# Patient Record
Sex: Male | Born: 1945 | Race: White | Hispanic: No | Marital: Married | Smoking: Current every day smoker
Health system: Southern US, Community
[De-identification: ages and names within clinical notes are randomized; demographics above are authoritative.]

## PROBLEM LIST (undated history)

## (undated) DIAGNOSIS — G621 Alcoholic polyneuropathy: Secondary | ICD-10-CM

## (undated) DIAGNOSIS — I739 Peripheral vascular disease, unspecified: Secondary | ICD-10-CM

## (undated) DIAGNOSIS — I5022 Chronic systolic (congestive) heart failure: Secondary | ICD-10-CM

## (undated) DIAGNOSIS — G20A1 Parkinson's disease without dyskinesia, without mention of fluctuations: Secondary | ICD-10-CM

## (undated) DIAGNOSIS — I2510 Atherosclerotic heart disease of native coronary artery without angina pectoris: Secondary | ICD-10-CM

## (undated) DIAGNOSIS — I10 Essential (primary) hypertension: Secondary | ICD-10-CM

## (undated) DIAGNOSIS — I219 Acute myocardial infarction, unspecified: Secondary | ICD-10-CM

## (undated) DIAGNOSIS — G20 Parkinson's disease: Secondary | ICD-10-CM

## (undated) HISTORY — PX: CORONARY ANGIOPLASTY WITH STENT PLACEMENT: SHX49

## (undated) HISTORY — PX: INGUINAL HERNIA REPAIR: SUR1180

---

## 2006-01-12 ENCOUNTER — Ambulatory Visit: Payer: Self-pay | Admitting: General Surgery

## 2006-04-17 ENCOUNTER — Ambulatory Visit: Payer: Self-pay | Admitting: General Surgery

## 2011-12-27 DIAGNOSIS — I219 Acute myocardial infarction, unspecified: Secondary | ICD-10-CM

## 2011-12-27 HISTORY — DX: Acute myocardial infarction, unspecified: I21.9

## 2012-07-12 ENCOUNTER — Inpatient Hospital Stay: Payer: Self-pay | Admitting: Internal Medicine

## 2012-07-12 LAB — COMPREHENSIVE METABOLIC PANEL
Alkaline Phosphatase: 91 U/L (ref 50–136)
BUN: 12 mg/dL (ref 7–18)
Bilirubin,Total: 1.3 mg/dL — ABNORMAL HIGH (ref 0.2–1.0)
Calcium, Total: 9.2 mg/dL (ref 8.5–10.1)
EGFR (African American): >60
EGFR (Non-African Amer.): >60
Glucose: 133 mg/dL — ABNORMAL HIGH (ref 65–99)
Osmolality: 274 (ref 275–301)
SGOT(AST): 164 U/L — ABNORMAL HIGH (ref 15–37)
Sodium: 136 mmol/L (ref 136–145)

## 2012-07-12 LAB — APTT: Activated PTT: 31.7 secs (ref 23.6–35.9)

## 2012-07-12 LAB — CK TOTAL AND CKMB (NOT AT ARMC)
CK, Total: 1188 U/L — ABNORMAL HIGH (ref 35–232)
CK-MB: 101.2 ng/mL — ABNORMAL HIGH (ref 0.5–3.6)

## 2012-07-12 LAB — CBC
HGB: 14.9 g/dL (ref 13.0–18.0)
MCH: 32.2 pg (ref 26.0–34.0)
Platelet: 199 10*3/uL (ref 150–440)
RBC: 4.63 10*6/uL (ref 4.40–5.90)
WBC: 10.6 10*3/uL (ref 3.8–10.6)

## 2012-07-12 LAB — PROTIME-INR: INR: 0.9

## 2012-07-13 LAB — LIPID PANEL
Cholesterol: 238 mg/dL — ABNORMAL HIGH (ref 0–200)
HDL Cholesterol: 39 mg/dL — ABNORMAL LOW (ref 40–60)
Ldl Cholesterol, Calc: 167 mg/dL — ABNORMAL HIGH (ref 0–100)
VLDL Cholesterol, Calc: 32 mg/dL (ref 5–40)

## 2012-07-13 LAB — APTT
Activated PTT: 71.9 secs — ABNORMAL HIGH (ref 23.6–35.9)
Activated PTT: 77.3 secs — ABNORMAL HIGH (ref 23.6–35.9)

## 2012-07-13 LAB — BASIC METABOLIC PANEL
Calcium, Total: 8.7 mg/dL (ref 8.5–10.1)
Co2: 25 mmol/L (ref 21–32)
Creatinine: 0.79 mg/dL (ref 0.60–1.30)
EGFR (Non-African Amer.): >60
Potassium: 4 mmol/L (ref 3.5–5.1)
Sodium: 136 mmol/L (ref 136–145)

## 2012-07-13 LAB — CK-MB: CK-MB: 167.8 ng/mL — ABNORMAL HIGH (ref 0.5–3.6)

## 2012-08-27 ENCOUNTER — Inpatient Hospital Stay: Payer: Self-pay | Admitting: Student

## 2012-08-27 LAB — CBC WITH DIFFERENTIAL/PLATELET
Basophil #: 0.1 10*3/uL (ref 0.0–0.1)
Basophil %: 1 %
Eosinophil #: 0.3 10*3/uL (ref 0.0–0.7)
HCT: 39.5 % — ABNORMAL LOW (ref 40.0–52.0)
HGB: 13.8 g/dL (ref 13.0–18.0)
Lymphocyte #: 1.5 10*3/uL (ref 1.0–3.6)
MCH: 32.5 pg (ref 26.0–34.0)
MCHC: 34.9 g/dL (ref 32.0–36.0)
MCV: 93 fL (ref 80–100)
Monocyte #: 0.7 x10 3/mm (ref 0.2–1.0)
Neutrophil #: 7.4 10*3/uL — ABNORMAL HIGH (ref 1.4–6.5)
Neutrophil %: 73.8 %
RDW: 13.9 % (ref 11.5–14.5)

## 2012-08-27 LAB — PROTIME-INR: INR: 5

## 2012-08-28 LAB — CBC WITH DIFFERENTIAL/PLATELET
Basophil #: 0.1 10*3/uL (ref 0.0–0.1)
Basophil %: 1 %
Eosinophil #: 0.5 10*3/uL (ref 0.0–0.7)
Eosinophil %: 6.2 %
HCT: 33.2 % — ABNORMAL LOW (ref 40.0–52.0)
HGB: 11.4 g/dL — ABNORMAL LOW (ref 13.0–18.0)
Lymphocyte #: 2 10*3/uL (ref 1.0–3.6)
MCH: 31.9 pg (ref 26.0–34.0)
MCV: 93 fL (ref 80–100)
Monocyte #: 0.8 x10 3/mm (ref 0.2–1.0)
Monocyte %: 9.1 %
Neutrophil #: 5.4 10*3/uL (ref 1.4–6.5)
Neutrophil %: 60.7 %
Platelet: 188 10*3/uL (ref 150–440)
RBC: 3.58 10*6/uL — ABNORMAL LOW (ref 4.40–5.90)
WBC: 8.9 10*3/uL (ref 3.8–10.6)

## 2012-08-28 LAB — BASIC METABOLIC PANEL
Anion Gap: 6 — ABNORMAL LOW (ref 7–16)
BUN: 16 mg/dL (ref 7–18)
Chloride: 105 mmol/L (ref 98–107)
Creatinine: 0.84 mg/dL (ref 0.60–1.30)
EGFR (African American): >60
Glucose: 94 mg/dL (ref 65–99)
Osmolality: 278 (ref 275–301)
Potassium: 3.6 mmol/L (ref 3.5–5.1)
Sodium: 139 mmol/L (ref 136–145)

## 2012-08-28 LAB — PROTIME-INR: Prothrombin Time: 22.1 secs — ABNORMAL HIGH (ref 11.5–14.7)

## 2012-08-29 LAB — CBC WITH DIFFERENTIAL/PLATELET
Eosinophil #: 0.7 10*3/uL (ref 0.0–0.7)
Eosinophil %: 7.1 %
MCH: 32 pg (ref 26.0–34.0)
MCHC: 34.1 g/dL (ref 32.0–36.0)
Monocyte #: 0.9 x10 3/mm (ref 0.2–1.0)
Neutrophil %: 61.2 %
Platelet: 200 10*3/uL (ref 150–440)
RBC: 3.7 10*6/uL — ABNORMAL LOW (ref 4.40–5.90)

## 2012-08-29 LAB — PROTIME-INR
INR: 1.7
Prothrombin Time: 20.2 secs — ABNORMAL HIGH (ref 11.5–14.7)

## 2015-04-14 NOTE — Consult Note (Signed)
Brief Consult Note: Diagnosis: Epistaxis in patient with recent NSTEMI s/p PTCA with DES placement.   Orders entered.   Comments: Nosebleed has stopped and INR elevated at 5, Pt received FFP and repeat PT/INR ordered and resulte pending. Due to recent DES placement will need to restart Plavix as at high risk for restenosis. Will check echo to r/o LV thrombus. Patient thinks he had LV thrombus [facility] and thus reason he was on warfarin in addition to Plavix. He has allergy ro ASA noted.  Electronic Signatures: Radene KneeKhan, Shaukat Ali (MD)   (Signed 03-Sep-13 11:07)  Co-Signer: Brief Consult Note Maia PlanManzi, Aanyah Loa A (PA-C)   (Signed 03-Sep-13 08:53)  Authored: Brief Consult Note  Last Updated: 03-Sep-13 11:07 by Radene KneeKhan, Shaukat Ali (MD)

## 2015-04-14 NOTE — Consult Note (Signed)
PATIENT NAME:  Darren [name], Darren [name] MR#:  [id number] DATE OF BIRTH:  [birth year]  DATE OF CONSULTATION:  08/28/2012  REFERRING PHYSICIAN:  Dr. Imogene Burnhen CONSULTING PHYSICIAN:  Verta EllenMonica A. Shonia Skilling, PA-C  PRIMARY CARE PHYSICIAN:  Dr. Hilda BladesPolanco   REASON FOR CONSULTATION: Epistaxis in a patient with recent cardiac stenting.   HISTORY OF PRESENT ILLNESS: Darren [name] is a 69 year old male who has been seen only one time at our office to follow up after being seen [facility] for angioplasty. The patient had non-ST elevated myocardial infarction and while at [facility] had cardiac catheterization with 100% stenosis of the mid LAD, D1 of 70%, OM1 of 60%.  He was transferred and had a stent placement to the LAD and also had TEE that showed a left ventricular thrombus. The patient was discharged from [facility] on clopidogrel 75 once daily, aspirin 81 (he underwent desensitization by allergy and immunology), and was started on warfarin. On recheck in our office his INR was subtherapeutic and he was told to increase his warfarin from 5 mg to 7 mg. The patient noticed he had intermittent nosebleeds for the past 1 to 2 weeks that would stop on their own. However, he had a severe nosebleed yesterday that despite pressure and ice packs could not be controlled. On admission here, the patient's INR was found to be 5.  He had clopidogrel, aspirin, and warfarin stopped and was administered FFP. He has not had any further bleeding and his nose is currently packed (the right nare).  PAST MEDICAL HISTORY:  1. Coronary artery disease. 2. Non-ST elevated myocardial infarction status post percutaneous transluminal coronary angioplasty and stenting to the LAD, done [facility] in July 2013.  3. Left ventricular thrombus [facility]14/[facility].  4. Tobacco abuse.   ALLERGIES: Penicillin. The patient was formerly allergic to aspirin but underwent  desensit[facility].  CURRENT MEDICATIONS: 1. Aspirin 81 mg daily.  2. Captopril 12.5 mg p.o. twice daily.  3. Lipitor 80 mg p.o. at bedtime.  4. Lopressor 25 mg twice daily.  5. Plavix 75 mg daily.  6. Warfarin 7 mg daily (recently increased from 5 mg).   FAMILY HISTORY: No family history as patient is an orphan.   SOCIAL HISTORY: The patient said he has quit smoking after his non-ST elevated myocardial infarction. He does not use any alcohol or illicit drugs.   REVIEW OF SYSTEMS:  CONSTITUTIONAL: The patient denies fevers, chills, headaches, dizziness, weakness. EYES: No double vision or blurred vision. ENT: Positive for epistaxis. CARDIOVASCULAR: No chest pain, chest pressure, or orthopnea. PULMONARY: Denies shortness of breath, coughing, or wheezing. GI: Denies abdominal pain, nausea, or vomiting.   PHYSICAL EXAMINATION:  GENERAL: This is a pleasant male who is lying in bed. He is not in any acute distress.   VITAL SIGNS: Temperature 98.7 degrees Fahrenheit, heart rate is 82, respiratory rate 18, blood pressure 102/63, oxygen saturation 97% on room air.   HEAD: Head atraumatic, normocephalic.   EYES: Pupils are round and equal. Conjunctivae pink. There is no scleral icterus.   EARS: Normal to external inspection.  NOSE:  The patient has packing in the right nares.   MOUTH: Good dentition. Moist mucous membranes.   NECK: Supple. Trachea is midline. No carotid bruits appreciated.   RESPIRATORY:   Lungs are clear to auscultation. No adventitious breath sounds appreciated.   CARDIOVASCULAR: Regular rate and rhythm. No  murmurs, rubs, or gallops appreciated.   ABDOMEN: Nondistended, soft. Bowel sounds present in all four quadrants.   EXTREMITIES: No cyanosis, clubbing, or edema.   ANCILLARY DATA: Glucose 94, BUN 16, creatinine 0.84, sodium 139, potassium 3.6, chloride 105, CO2 28, estimated GFR is greater than 60. White blood cell count 8.9,  hemoglobin 11.4, hematocrit 33.2, platelet count 188,000. PT after administration of FFP was 22.1 with an INR of 1.9.  (On admission his PT was 46 and INR was 5.)   ASSESSMENT/PLAN:  1. Coronary artery disease status post recent stent placement at [facility] after non-ST elevation myocardial infarction.      The patient was discharged on clopidogrel, aspirin, and warfarin as he had a left ventricular thrombus found on echocardiogram. We will need to repeat echocardiogram and if thrombus is still visible, will need anticoagulation with warfarin. To avoid in-stent restenosis would advise restarting Plavix and aspirin as well. We will need to monitor the patient for bleeding as had severe episode of epiastaxis. We will restart Plavix and aspirin at this time and await echocardiogram results. ENT will be consulted as the patient may need possible cautery of nasal vessel which may reduce bleeding instances in the future.        We will continue to follow this patient with you. Thank you very much for this consultation and allowing [place] to participate in this patient's care.  ____________________________ Verta Ellen, PA-C mam:bjt D: 08/28/2012 10:43:55 ET T: 08/28/2012 11:17:55 ET JOB#: [id number]  cc: Verta Ellen, PA-C, <Dictator> Jeannie Done, MD Jimmey Hengel A [facility] PA ELECTRONICALLY SIGNED 08/28/2012 13:19

## 2015-04-14 NOTE — Consult Note (Signed)
Brief Consult Note: Diagnosis: Epistaxis.   Patient was seen by consultant.   Consult note dictated.   Recommend further assessment or treatment.   Comments: 69 y.o. male with anticaogulation of ASA, Plavix, Coumadin with severe nose bleed.  Packed in ED and bleeding has stopped.  Admitted for elevated INR.  PE: GEN- NAD, alert and oriented Ears- EACs clear Nose- packing in place on right, clear left sided nasal cavity OC/OP- no mass or lesions, no posterior bleeding Neck- supple with no LAD or masses or lesions  Impression:  Epistaxis s/p packing  Plan:1) Nasal saline irrigation QID 2)  Bacitracin to left nostril BID 3)  Patient needs Antibiotics with Staph coverage while packing is in place.  Given PCN allergy and difficulty with INR/Plavix, will defer to Cardiology regarding which they prefer (Bactrim/Clindamycin/Doxycycline/Omnicef) 4)  Follow up on Friday 9 /05/2012 for packing removal 5)  Afrin prn bleeding until then.  Electronic Signatures: Dalyn Becker, Rayfield Citizenreighton Charles (MD)  (Signed 03-Sep-13 12:11)  Authored: Brief Consult Note   Last Updated: 03-Sep-13 12:11 by Flossie DibbleVaught, Foster Sonnier Charles (MD)

## 2015-04-14 NOTE — Discharge Summary (Signed)
PATIENT NAME:  Darren [name], Darren [name] MR#:  [id number] DATE OF BIRTH:  [birth year]  DATE OF ADMISSION:  07/12/2012 DATE OF DISCHARGE:  07/13/2012  DIAGNOSES:  1. NSTEMI. 2. History of smoking. 3. Abnormal liver function tests.  4. Hyperlipidemia.   DISPOSITION: The patient is being [facility].   CONSULTATION: Cardiology consultation with Dr. Adrian BlackwaterShaukat Khan   LABORATORY, DIAGNOSTIC, AND RADIOLOGICAL DATA: Chest x-ray showed no acute abnormalities.   CBC normal. The patient's troponin was more than 40. BMP otherwise was normal. LDL 167, cholesterol 238, triglycerides 160, HDL 39, AST 164, ALT 94.   PROCEDURE: Cardiac catheterization.   HOSPITAL COURSE: The patient is a 69 year old male with possible history of hypertension, hyperlipidemia not on any medication, and smoking who presented with chest pain. He was found to have acute MI with a troponin more than 40. He was admitted to the ICU and started on Integrilin and heparin drips, aspirin, beta-blocker, ACE, statin, and nitro paste. Cardiology consultation with Dr. Adrian BlackwaterShaukat Khan was obtained and the patient underwent a catheterization. He was found to have 100% occluded LAD and had completed his anterior septal MI. He was initially offered to be sent to [facility], however, he did not want to go. On reviewing his film, it was decided that he would be best served at [facility]. He was [facility] to the service of Dr. Cherly Hensenhang in a stable condition.       TIME SPENT: 45 minutes.   ____________________________ Darrick MeigsSangeeta Emersyn Wyss, MD sp:drc D: 07/13/2012 16:01:57 ET T: 07/16/2012 09:13:26 ET JOB#: [id number]  cc: Darrick MeigsSangeeta Keenen Roessner, MD, <Dictator> Darrick MeigsSANGEETA Deion Forgue MD ELECTRONICALLY SIGNED 07/21/2012 7:26

## 2015-04-14 NOTE — Consult Note (Signed)
Patient has occluded 100 % LAD, and completed his anterseptal MI. [facility] and [facility]. He probably completed his MI before 5:30 PM yesterday, since had no further chest pains since then. He was offered to to be sent [facility] yesterday, but did not want to go. He will be [facility] by Dr. Cherly Hensenhang.  Electronic Signatures: Radene KneeKhan, Shaukat Ali (MD)  (Signed on 19-Jul-13 10:59)  Authored  Last Updated: 19-Jul-13 10:59 by Radene KneeKhan, Shaukat Ali (MD)

## 2015-04-14 NOTE — Discharge Summary (Signed)
PATIENT NAME:  Darren [name], Darren [name] MR#:  [id number] DATE OF BIRTH:  03/27/1946  DATE OF ADMISSION:  08/27/2012 DATE OF DISCHARGE:  08/29/2012  CONSULTANTS:  1. Dr. Andee Poles, ENT. 2. Dr. Adrian Blackwater, cardiology.   CARDIOLOGIST: Dr. Welton Flakes.  CHIEF COMPLAINT: Nosebleed.   DISCHARGE DIAGNOSES:  1. Nosebleed in the setting of supertherapeutic international normalized ratio in the setting of Coumadin use.  2. Acute hemorrhagic anemia from above status post two units of fresh frozen plasma for reversal. 3. Recent left ventricular thrombus seen in echocardiogram in July 2013 which has resolved per our echocardiogram. 4. Recent ST elevation myocardial infarction status post bare metal stent to left anterior descending in [facility] in July 2013.  5. Hypertension.  6. Hyperlipidemia.   DISCHARGE MEDICATIONS:  1. Aspirin 81 mg daily. The patient has been apparently desensitized at [facility] in July 2013.  2. Lipitor 80 mg once a day.  3. Captopril 12.5 mg 1 tab 2 times a day.  4. Plavix 75 mg daily.  5. Metoprolol tartrate 12.5 mg 1 tab times a day.  6. Vibramycin 100 mg one cap every 12 hours for three days.  7. Please stop taking warfarin.   DIET: Low sodium.   ACTIVITY: As tolerated.   FOLLOWUP:  1. Please follow up with ENT on 08/31/2012 as scheduled for you.  2. Please follow with your cardiologist within a week.  3. Please follow with your primary care physician within two weeks.   DISPOSITION: Home.   HISTORY OF PRESENT ILLNESS: For full details of the history and physical, please see the dictation on 08/27/2012 Dr. Imogene Burn, but briefly, this is a pleasant 69 year old Caucasian male with history of ST elevation myocardial infarction at [facility], hyperlipidemia, and smoking who came in with nosebleeds. The patient apparently had a LV thrombus and that is why he was on Coumadin started in July. He had been having off and on nosebleeds for the past two weeks prior to arrival and on the day of admission  had extensive nosebleed which he could not control and therefore was admitted to the hospital and found to have an elevated INR of 5. He did receive two units of FFP while in the ED.   LABORATORY, RADIOLOGICAL AND DIAGNOSTIC DATA: Initial hemoglobin 13.8, lowest hemoglobin 11.4 and by discharge 11.8. Hematocrit on arrival 39.5, by discharge 34.6. Initial WBC 10. Creatinine 0.84. Initial INR 5, last INR 1.7. Echocardiogram showing normal LV systolic function and mild diastolic dysfunction. No evidence of LV thrombus. 1.4 cm opacity mobile in left atrial wall which does not appear to be thrombus, but rather a myxoma.   HOSPITAL COURSE: The patient was admitted to the hospitalist service. The patient had been having the nosebleeds as above. However, had no significant nosebleeds and the right nostril was packed by ER physician. The patient was seen by ENT as well as his cardiologist, Dr. Welton Flakes. Per ENT's recommendation, he was started on doxycycline to help prevent possible staph infection. He has an appointment on 08/31/2012 in the morning with Dr. Andee Poles from ENT for removal of the packing and possible cauterization at that time. An echocardiogram was repeated here which did not show the thrombus and per cardiology, Coumadin can be discontinued. While hospitalized, his Plavix was resumed. He apparently did have a ST elevation myocardial infarction per review of [facility] records and had a bare metal stent to his LAD. Furthermore, he had aspirin desensitization there and was discharged on aspirin at [facility] and that can be resumed  upon discharge as well.   CODE STATUS: The patient is FULL CODE.    TOTAL TIME SPENT: 35 minutes.    ____________________________ Lilyrose Tanney, MD sa:ap D: 08/29/2012 15:12Krystal Eaton:00 ET T: 08/31/2012 11:31:29 ET JOB#: [id number]  cc: Krystal EatonShayiq Leeann Bady, MD, <Dictator> Laurier NancyShaukat A. Khan, MD Krystal EatonSHAYIQ Lehi Phifer MD ELECTRONICALLY SIGNED 09/05/2012 0:23

## 2015-04-14 NOTE — Consult Note (Signed)
PATIENT NAME:  Darren [name], Darren [name] MR#:  [id number] DATE OF BIRTH:  June 29, 1946  DATE OF CONSULTATION:  07/12/2012  CONSULTING PHYSICIAN:  Laurier NancyShaukat A. Khan, MD  INDICATION FOR CONSULTATION: Acute myocardial infarction.   HISTORY OF PRESENT ILLNESS: This is a 69 year old white male with a past medical history of hypercholesterolemia, questionable hypertension, no history of diabetes, started having chest pain at 11:00 this morning and decided to come to the Emergency Room late afternoon. He had labs done and was given Celebrex initially for chest pain, but I was called around 5:30 p.m. to evaluate the patient. At this time, the Cardiac Catheterization Lab is closed. The reason I was called was because his EKG was abnormal, and his CPK was 1188. Troponin was 13, and CPK-MB was 101.2. He was initially lifting heavy equipment at work, and initially it was thought that he may have musculoskeletal chest pain; but troponin came back positive and then EKG was done, and I was asked to evaluate the patient. Upon arrival to see him, he is denying any chest pain. He did get morphine to relieve chest pain earlier. He says when he got morphine his chest pain slowly eased off. Initially it was 4/10, right now it is 0/10. On further questioning, he says that his chest pain actually was started around 11 a.m., and that is when around 3:00 or 4:00 he got concerned and decided to come to the hospital. He also had another episode of left armpit pain yesterday and left arm pain which happened yesterday afternoon and lasted for a few hours and subsided. He considered this all musculoskeletal, and  that is why he did not seek any help.   PAST MEDICAL HISTORY:  1. History of hypercholesterolemia.  2. History of questionable hypertension.   SOCIAL HISTORY: He smokes 1 pack per day. He denies EtOH abuse, occasional one glass of wine during dinner.   FAMILY HISTORY: He was adopted. He is not able to provide family history.    PHYSICAL EXAMINATION:  GENERAL: He is alert, oriented x3, in no acute distress right now. He denies chest pain.   VITAL SIGNS: Blood pressure is 150/70, respirations 18. His pulse is 70. He is afebrile.   NECK: Exam revealed no JVD.  LUNGS: Clear.   HEART: Regular rate and rhythm. Normal S1, S2. No audible murmur.   ABDOMEN: Soft, nontender, positive bowel sounds.   EXTREMITIES: No pedal edema.   LABORATORY, DIAGNOSTIC AND RADIOLOGICAL DATA: His BUN and creatinine is 12/0.76. His SGOT is 164, SGPT 94, bilirubin 1.3. CPK 1188, CPK-MB 101.2, troponin 13. EKG shows normal sinus rhythm, poor R wave progression with Q waves in the anterolateral leads with residual 0.5 mm ST elevation in V5 to V6.   ASSESSMENT AND PLAN: Acute Q wave myocardial infarction which is evolving. He probably completed his anteroseptal myocardial infarction. Right now he is chest pain free. It is possible the MI started yesterday and had been going on since yesterday since troponin is already elevated at 13. Since he is chest pain free, option was given to him to be transferred [facility] for angioplasty immediately since the Cath Lab here is closed. The patient opted for staying here and having cardiac catheterization in the morning. He will be started on IV heparin, IV Integrilin. He is allergic to aspirin and will be started on Plavix. The procedure will be explained to him for cardiac catheterization and possible angioplasty.   Thank you very much for the referral  ____________________________ Laurier Nancy, MD sak:cbb D: 07/12/2012 17:59:59 ET T: 07/12/2012 18:29:25 ET JOB#: [id number]  cc: Laurier Nancy, MD, <Dictator> Laurier Nancy MD ELECTRONICALLY SIGNED 08/09/2012 12:43

## 2015-04-14 NOTE — H&P (Signed)
PATIENT NAME:  Darren [name], Darren [name] MR#:  [id number] DATE OF BIRTH:  11-May-1946  DATE OF ADMISSION:  08/27/2012  PRIMARY CARE PHYSICIAN: Dr. Armandina Gemma in Urgent Care   REFERRING PHYSICIAN: Dr. Brien Mates   CHIEF COMPLAINT: Nose bleeding today.   HISTORY OF PRESENT ILLNESS: 69 year old Caucasian male with a history of non-STEMI six weeks ago status post a stent in [facility], hyperlipidemia, history of smoking presented to the ED with nose bleeding today. Actually patient has nose bleeding on and off several times for the past two weeks but nose bleeding stopped spontaneously. Patient had a lot of nose bleeding this morning which has not stopped, lasted about five hours. He called Dr. Welton Flakes who is cardiologist and Dr. Welton Flakes advised patient to come to ED for further evaluation. In ED Dr. Brien Mates did compact to stop the nose bleeding. Patient right now was noted to be at 5. According to patient his last INR was 2.5 two weeks ago and then his Coumadin was increased from 5 mg daily to 7 mg daily. Patient also has some arm bruises. Dr. Brien Mates order fresh frozen plasma two units.    PAST MEDICAL HISTORY:  1. Coronary artery disease.  2. Non-STEMI. 3. Hyperlipidemia. 4. Tobacco use.   FAMILY HISTORY: Patient is an orphan. No family history.   SOCIAL HISTORY: Patient quit smoking after non-STEMI. No alcohol drinking or illicit drugs.   ALLERGIES: Aspirin and penicillin.   MEDICATIONS:  1. Aspirin 81 mg p.o. daily.  2. Captopril 12.5 mg p.o. b.i.d.  3. Lipitor 80 mg p.o. at bedtime. 4. Lopressor 25 mg tablet 0.5 tablet p.o. daily.  5. Plavix 75 mg p.o. daily.  6. Warfarin 2 mg p.o. along with 5 mg tablet p.o. daily.   REVIEW OF SYSTEMS: CONSTITUTIONAL: Patient denies any fever, chills. No headache or dizziness. No weakness. EYES: No double vision, blurred vision. ENT: Positive for epistaxis but no postnasal drip, slurred speech, or dysphagia. CARDIOVASCULAR: No chest pain, palpitation, orthopnea, or  nocturnal dyspnea. No leg edema. PULMONARY: No cough, sputum, shortness of breath, or hemoptysis. GASTROINTESTINAL: No abdominal pain, nausea, vomiting, or diarrhea. No melena or bloody stools. GENITOURINARY: No dysuria, hematuria, or incontinence. SKIN: No rash or jaundice but has some bruises in the arm. HEMATOLOGY: Has nose bleeding and bruises. ENDOCRINE: No polyuria, polydipsia, heat or cold intolerance. NEUROLOGY: No syncope, loss of consciousness or seizure   PHYSICAL EXAMINATION:  VITAL SIGNS: Temperature 98.7, blood pressure 182/99, pulse 106, oxygen saturation 97 on room air.   GENERAL: Patient is alert, awake, oriented in no acute distress.   HEENT: Pupils round, equal, reactive to light and accommodation. No discharge from nose, ear. Right nostril was compacted. No active bleeding at this time. Moist oral mucosa. Clear oropharynx.   NECK: Supple. No JVD or carotid bruits. No lymphadenopathy. No thyromegaly.   CARDIOVASCULAR: S1, S2 regular rate and rhythm. No murmurs, gallops.   PULMONARY: Bilateral air entry. No wheezing, rales. No accessory muscles to breathe.   ABDOMEN: Soft. No distention or tenderness. No organomegaly. Bowel sounds present.   EXTREMITIES: No edema, clubbing, or cyanosis. No calf tenderness. Strong bilateral pedal pulses. There are a few tiny red spots which is new on bilateral lower part of leg. According to patient this is new possibly due to Coumadin coagulopathy.   SKIN: No rash or jaundice but has some bruises on the left and right arm.   NEUROLOGY: Alert and oriented x3. No focal deficit. Power 5/5. Sensation intact. Deep tendon reflexes 2+.  LABORATORY, DIAGNOSTIC AND RADIOLOGICAL DATA: WBC 10, hemoglobin 13.8, platelets 228, PT 46, INR 5.0.   IMPRESSION:  1. Nose bleeding.  2. Coumadin coagulopathy.  3. History of non-STEMI status post stent.  4. Coronary artery disease.  5. Hypertension.  6. Hyperlipidemia.   PLAN OF TREATMENT:  1. Patient  will be admitted to telemetry floor. Will hold Coumadin, aspirin, Plavix. Will give FFP 2 units transfusion and follow up CBC. Will get a cardiology consult from Dr. Welton FlakesKhan.  2. Will continue captopril and Lopressor. Also continue Lipitor.  3. GI prophylaxis with Protonix and deep vein thrombosis prophylaxis with TED.  Discussed patient's situation and plan of treatment with patient and patient's wife.   TIME SPENT: About 60 minutes.   ____________________________ Shaune PollackQing Litisha Guagliardo, MD qc:cms D: 08/27/2012 18:35:59 ET T: 08/28/2012 06:18:59 ET JOB#: [id number]  cc: Shaune PollackQing Shauntee Karp, MD, <Dictator> Dr. Armandina GemmaPalancol in Urgent Care  Shaune PollackQING Ayyub Krall MD ELECTRONICALLY SIGNED 08/28/2012 16:18

## 2015-04-14 NOTE — Consult Note (Signed)
PATIENT NAME:  Darren [name], Darren [name] MR#:  [id number] DATE OF BIRTH:  [birth year]  DATE OF CONSULTATION:  08/28/2012  REFERRING PHYSICIAN:  Dr. Audry PiliKhan  CONSULTING PHYSICIAN:  Kyung Ruddreighton C. Khaila Velarde, MD  REASON FOR CONSULTATION: Epistaxis.   HISTORY OF PRESENT ILLNESS: Patient is a 69 year old male who six weeks ago had an MI, was catheterized and had a [facility]. He was also found to have a ventricular thrombus at that point. He was placed on aspirin, Plavix as well as Coumadin. This recently was increased from 5 mg per day to 7 mg per day. Shortly after this he developed recurrent epistaxis that has progressively worsened. He was sent to the Emergency Room and was found to have an elevated INR over 5. Balloon Miracil pack was placed on the right. His bleeding has stopped and since that time he has undergone multiple units of fresh frozen plasma and his bleeding as continue to stop and his INR has decreased. I was consulted for evaluation for possible treatment of his epistaxis.   PAST MEDICAL HISTORY:  1. Coronary artery disease with non-ST elevated MI.  2. Hyperlipidemia. 3. Tobacco abuse.   SOCIAL HISTORY: Patient has history of smoking but quit recently. Denies any excessive alcohol.    ALLERGIES: Penicillin and he has recently been desensitized to [facility] at [facility].  FAMILY HISTORY: Patient is orphaned with no known family history.   CURRENT MEDICATIONS: Reviewed and documented in electronic medical record.    REVIEW OF SYSTEMS: No fever, chills, headache, dizziness, blurred vision. Does have epistaxis as per history of present illness. No significant chest pain. No respiratory problems.   PHYSICAL EXAMINATION:  VITAL SIGNS: Temperature 98, pulse 81, respirations 18, blood pressure 144/87, pulse oximetry 97% on room air.   GENERAL: He is a well-nourished, well-developed male in no acute distress.   HEENT: Head is normocephalic, atraumatic.   EARS: External EACs are clear. No  otorrhea.   NOSE: There is an anterior balloon packing in right with balloon slowly inflated. There is no evidence of active bleeding. The packing is white. On the left there is a clear nasal cavity with no evidence of active bleeding or clot.  ORAL CAVITY AND OROPHARYNX: Reveals no abnormal mass, lesion or evidence of posterior oropharyngeal bleeding.   NECK: Supple. No lymphadenopathy or thyromegaly.   IMPRESSION: Epistaxis with anti-coagulopathy.   PLAN:  1. If the patient is required to stay on Coumadin and Plavix as well as aspirin because of ventricular thrombus I recommend a very tight control of his INR level as he is at very high risk for recurrent bleeds.  2. I recommended placement on oral antibiotic with positive staph coverage. As long as the packing is in place given the patient's recent difficulty with INR as well as his history of being penicillin allergic would defer to cardiology regarding which choice, however, usually Bactrim, doxycycline, Omnicef or clindamycin would be used in this situation.  3. Nosebleed. Precautions including humidification as well as nasal saline irrigation and bacitracin ointment applied to the nose with Q-tip several times a day to prevent from drying out.  4. I would like to see him back on Friday, September 6, in my office for removal of the packing and possible cauterization of that time.  5. Please contact my office with any questions or concerns in the interim.   ____________________________ Kyung Ruddreighton C. Eilene Voigt, MD ccv:cms D: 08/28/2012 12:17:02 ET T: 08/28/2012 12:54:00 ET  JOB#: [id number] cc: Kyung Ruddreighton C. Broc Caspers, MD, <  Dictator> Kyung Rudd MD ELECTRONICALLY SIGNED 09/17/2012 7:16

## 2015-04-14 NOTE — H&P (Signed)
PATIENT NAME:  Darren [name], Darren [name] MR#:  [id number] DATE OF BIRTH:  06/23/1946  DATE OF ADMISSION:  07/12/2012  PRIMARY CARE PHYSICIAN: Does not have one.   CHIEF COMPLAINT: Chest pain.   HISTORY OF PRESENT ILLNESS: This is a 69 year old male who presents to the hospital complaining of intermittent chest pain and diaphoresis that began in the past two days. The patient says that he was out for a job yesterday where he was doing some heavy lifting at work. Shortly after he finished his work he developed some chest pain in the left side of his shoulder blade and became diaphoretic. It lasted maybe for a few minutes and it resolved by itself.  He did not think much of it and went home. This morning at 6:00 he woke up again with left-sided shoulder pain along with some diaphoresis. Again it resolved within a half hour or so, and he was able to go back to sleep, but when he was going to work he started developing this ache in the center of his chest along with further diaphoresis. He was a bit concerned and therefore came to the ER for further evaluation. In the Emergency Room, the patient was noted to have elevated cardiac markers consistent with an acute myocardial infarction, with an elevated troponin at 13. He is EKG also showed Q waves in the lateral leads. Hospitalist services were contacted for further treatment and evaluation. The patient presently is chest pain free after receiving some morphine. He denies any shortness of breath, any nausea, vomiting, any further diaphoresis, any palpitations, any syncope, dizziness, or any other associated symptoms.   REVIEW OF SYSTEMS:  CONSTITUTIONAL: No documented fever. No weight gain or weight loss.  EYES: No blurry or double vision. ENT: No tinnitus. No postnasal drip. No redness of the oropharynx. RESPIRATORY: No cough, no wheeze, no hemoptysis. CARDIOVASCULAR: Positive chest pain. No orthopnea, no palpitations, no syncope. GI: No nausea, no vomiting, no  diarrhea, no abdominal pain, no melena or hematochezia. GU: No dysuria or hematuria. ENDOCRINE: No polyuria or nocturia. No heat or cold intolerance.  HEME: No anemia. No bruising. No bleeding. INTEGUMENT: No rashes. No lesions. MUSCULOSKELETAL: No arthritis. No swelling. No gout. NEUROLOGIC: No numbness. No tingling. No ataxia. No seizure-type activity. PSYCH: No anxiety. No insomnia. No ADD.   PAST MEDICAL HISTORY:  He does not have any significant past medical history.   ALLERGIES: Allergic to aspirin, which causes hives, and penicillin.   SOCIAL HISTORY: Does smoke about a pack per day, has been smoking for the past 40+ years. No alcohol abuse. No illicit drug abuse.   FAMILY HISTORY: He is an orphan and therefore does not recall his family history.   CURRENT MEDICATIONS: He is currently on no medications.   PHYSICAL EXAM ON ADMISSION:  VITAL SIGNS: Temperature 97.8, pulse 68, respirations 20, blood pressure 170/98, sats 100% on 2 liters nasal cannula.   GENERAL: He is a pleasant-appearing male in no apparent distress.   HEENT: Atraumatic, normocephalic. Extraocular muscles are intact. Pupils are equal and reactive to light. Sclerae anicteric. No conjunctival injection. No pharyngeal erythema.   NECK: Supple. No jugular venous distention. No bruits, no lymphadenopathy, no thyromegaly.   HEART: Regular rate and rhythm. No murmurs, no rubs, no clicks.   LUNGS: Clear to auscultation bilaterally. No rales, no rhonchi, no wheezes.   ABDOMEN: Soft, flat, nontender, nondistended. Has good bowel sounds. No hepatosplenomegaly appreciated.   EXTREMITIES: No evidence of any cyanosis, clubbing, or peripheral edema.  Has +2 pedal and radial pulses bilaterally.   NEUROLOGIC: The patient is alert, awake, and oriented times three with no focal motor or sensory deficits appreciated bilaterally.   SKIN: Moist and warm with no rash appreciated.   LYMPHATIC: There is no cervical or axillary  lymphadenopathy.   LABORATORY, DIAGNOSTIC, AND RADIOLOGICAL DATA: Serum glucose 133, BUN 12, creatinine 0.7, sodium 136, potassium 4.3, chloride 104, bicarbonate 22, albumin 3.8, AST 164, ALT 94. CK 1188, CK-MB 101.2, troponin 13. White cell count 10.6, hemoglobin 14.9, hematocrit 44.8, platelet count 199. The patient did have an EKG done which showed a normal sinus rhythm with normal axes with a right bundle branch block and Q waves in the inferolateral leads, but no ST elevations. The patient did have some T wave inversions in the lateral leads.   ASSESSMENT AND PLAN: This is a 69 year old male with no significant past medical history but history of tobacco abuse who presents to the hospital with intermittent chest pain and diaphoresis and noted to have elevated cardiac markers consistent with an acute myocardial infarction.  1. Acute myocardial infarction. This is a non-ST elevation myocardial infarction. The patient has likely had an evolving infarction over the past day as his symptoms started late yesterday. He does have risk factor given history of long-time tobacco abuse. Currently he is chest pain free and hemodynamically stable. I will give him aggressive therapy with Plavix as he cannot take aspirin, beta blocker, ACE inhibitor, and statin, put him on a heparin drip, also an Integrilin drip. The patient was seen in consultation by Dr. Adrian BlackwaterShaukat Khan. He is likely to have a cardiac catheterization tomorrow. The patient was also given the option to be transferred currently to [facility] or to stay here and have a catheterization tomorrow, and he prefers to stay here presently.  2. Abnormal liver function tests. I think this is likely secondary to his acute myocardial infarction. This can be further followed after treatment tomorrow.  3. Tobacco abuse. I will place him on a nicotine patch. The patient was counseled on quitting smoking anywhere between 1 and 3 minutes.   CODE STATUS: The patient is a FULL  CODE.  TIME SPENT ON ADMISSION: 50 minutes.   ____________________________ Rolly PancakeVivek J. Cherlynn KaiserSainani, MD vjs:bjt D: 07/12/2012 18:32:42 ET T: 07/13/2012 08:06:05 ET JOB#: [id number]  cc: Rolly PancakeVivek J. Cherlynn KaiserSainani, MD, <Dictator> Houston SirenVIVEK J Madiha Bambrick MD ELECTRONICALLY SIGNED 07/16/2012 13:29

## 2016-09-27 ENCOUNTER — Other Ambulatory Visit: Payer: Self-pay | Admitting: Internal Medicine

## 2016-09-27 DIAGNOSIS — R319 Hematuria, unspecified: Secondary | ICD-10-CM

## 2016-09-27 DIAGNOSIS — R3129 Other microscopic hematuria: Secondary | ICD-10-CM

## 2016-10-03 ENCOUNTER — Ambulatory Visit
Admission: RE | Admit: 2016-10-03 | Discharge: 2016-10-03 | Disposition: A | Discharge status: Home / Self Care | Payer: Medicare HMO | Source: Ambulatory Visit | Attending: Internal Medicine | Admitting: Internal Medicine

## 2016-10-03 DIAGNOSIS — I253 Aneurysm of heart: Secondary | ICD-10-CM | POA: Insufficient documentation

## 2016-10-03 DIAGNOSIS — N281 Cyst of kidney, acquired: Secondary | ICD-10-CM | POA: Insufficient documentation

## 2016-10-03 DIAGNOSIS — R319 Hematuria, unspecified: Secondary | ICD-10-CM | POA: Diagnosis not present

## 2016-10-03 DIAGNOSIS — I700 Atherosclerosis of aorta: Secondary | ICD-10-CM | POA: Diagnosis not present

## 2016-10-03 DIAGNOSIS — K5730 Diverticulosis of large intestine without perforation or abscess without bleeding: Secondary | ICD-10-CM | POA: Insufficient documentation

## 2016-10-03 DIAGNOSIS — I2510 Atherosclerotic heart disease of native coronary artery without angina pectoris: Secondary | ICD-10-CM | POA: Insufficient documentation

## 2016-10-03 DIAGNOSIS — I252 Old myocardial infarction: Secondary | ICD-10-CM | POA: Diagnosis not present

## 2016-10-03 HISTORY — DX: Acute myocardial infarction, unspecified: I21.9

## 2016-10-03 HISTORY — DX: Essential (primary) hypertension: I10

## 2016-10-03 LAB — POCT I-STAT CREATININE: CREATININE: 0.7 mg/dL (ref 0.61–1.24)

## 2016-10-03 MED ORDER — IOPAMIDOL (ISOVUE-300) INJECTION 61%
125.0000 mL | Freq: Once | INTRAVENOUS | Status: AC | PRN
Start: 2016-10-03 — End: 2016-10-03
  Administered 2016-10-03: 125 mL via INTRAVENOUS

## 2018-03-27 ENCOUNTER — Telehealth: Payer: Self-pay | Admitting: *Deleted

## 2018-03-27 DIAGNOSIS — Z122 Encounter for screening for malignant neoplasm of respiratory organs: Secondary | ICD-10-CM

## 2018-03-27 DIAGNOSIS — Z87891 Personal history of nicotine dependence: Secondary | ICD-10-CM

## 2018-03-27 NOTE — Telephone Encounter (Signed)
Received referral for initial lung cancer screening scan. Contacted patient and obtained smoking history,(current, ) as well as answering questions related to screening process. Patient denies signs of lung cancer such as weight loss or hemoptysis. Patient denies comorbidity that would prevent curative treatment if lung cancer were found. Patient is scheduled for shared decision making visit and CT scan on 04/05/18.

## 2018-04-05 ENCOUNTER — Inpatient Hospital Stay: Payer: Medicare HMO | Attending: Nurse Practitioner | Admitting: Nurse Practitioner

## 2018-04-05 ENCOUNTER — Ambulatory Visit
Admission: RE | Admit: 2018-04-05 | Discharge: 2018-04-05 | Disposition: A | Discharge status: Home / Self Care | Payer: Medicare HMO | Source: Ambulatory Visit | Attending: Nurse Practitioner | Admitting: Nurse Practitioner

## 2018-04-05 DIAGNOSIS — Z87891 Personal history of nicotine dependence: Secondary | ICD-10-CM

## 2018-04-05 DIAGNOSIS — Z122 Encounter for screening for malignant neoplasm of respiratory organs: Secondary | ICD-10-CM | POA: Insufficient documentation

## 2018-04-05 DIAGNOSIS — I700 Atherosclerosis of aorta: Secondary | ICD-10-CM | POA: Diagnosis not present

## 2018-04-05 NOTE — Progress Notes (Signed)
In accordance with CMS guidelines, patient has met eligibility criteria including age, absence of signs or symptoms of lung cancer.  Social History   Tobacco Use  . Smoking status: Not on file  Substance Use Topics  . Alcohol use: Not on file  . Drug use: Not on file      A shared decision-making session was conducted prior to the performance of CT scan. This includes one or more decision aids, includes benefits and harms of screening, follow-up diagnostic testing, over-diagnosis, false positive rate, and total radiation exposure.   Counseling on the importance of adherence to annual lung cancer LDCT screening, impact of co-morbidities, and ability or willingness to undergo diagnosis and treatment is imperative for compliance of the program.   Counseling on the importance of continued smoking cessation for former smokers; the importance of smoking cessation for current smokers, and information about tobacco cessation interventions have been given to patient including [place] and 1800 quit [place] programs.   Written order for lung cancer screening with LDCT has been given to the patient and any and all questions have been answered to the best of my abilities.    Yearly follow up will be coordinated by Burgess Estelle, Thoracic Navigator.  Beckey Rutter, DNP, AGNP-C [place] [facility] [phone] (work cell) [phone] (office) 04/05/18 3:37 PM

## 2018-04-06 ENCOUNTER — Encounter: Payer: Self-pay | Admitting: *Deleted

## 2018-05-10 ENCOUNTER — Other Ambulatory Visit: Payer: Self-pay | Admitting: Neurology

## 2018-05-10 DIAGNOSIS — G621 Alcoholic polyneuropathy: Secondary | ICD-10-CM

## 2018-05-29 ENCOUNTER — Ambulatory Visit
Admission: RE | Admit: 2018-05-29 | Discharge: 2018-05-29 | Disposition: A | Discharge status: Home / Self Care | Payer: Medicare HMO | Source: Ambulatory Visit | Attending: Neurology | Admitting: Neurology

## 2018-05-29 ENCOUNTER — Ambulatory Visit: Payer: Medicare HMO

## 2018-05-29 DIAGNOSIS — G621 Alcoholic polyneuropathy: Secondary | ICD-10-CM | POA: Insufficient documentation

## 2019-03-14 ENCOUNTER — Encounter: Payer: Self-pay | Admitting: *Deleted

## 2019-03-17 ENCOUNTER — Encounter: Payer: Self-pay | Admitting: *Deleted

## 2019-04-30 ENCOUNTER — Encounter: Payer: Self-pay | Admitting: Radiology

## 2019-04-30 ENCOUNTER — Other Ambulatory Visit: Payer: Self-pay

## 2019-04-30 ENCOUNTER — Emergency Department: Payer: Medicare HMO

## 2019-04-30 ENCOUNTER — Inpatient Hospital Stay
Admission: EM | Admit: 2019-04-30 | Discharge: 2019-05-27 | DRG: 326 | Disposition: E | Payer: Medicare HMO | Attending: General Surgery | Admitting: General Surgery

## 2019-04-30 DIAGNOSIS — E876 Hypokalemia: Secondary | ICD-10-CM | POA: Diagnosis not present

## 2019-04-30 DIAGNOSIS — D62 Acute posthemorrhagic anemia: Secondary | ICD-10-CM | POA: Diagnosis not present

## 2019-04-30 DIAGNOSIS — K255 Chronic or unspecified gastric ulcer with perforation: Principal | ICD-10-CM | POA: Diagnosis present

## 2019-04-30 DIAGNOSIS — R198 Other specified symptoms and signs involving the digestive system and abdomen: Secondary | ICD-10-CM | POA: Diagnosis not present

## 2019-04-30 DIAGNOSIS — E872 Acidosis: Secondary | ICD-10-CM | POA: Diagnosis not present

## 2019-04-30 DIAGNOSIS — I472 Ventricular tachycardia: Secondary | ICD-10-CM | POA: Diagnosis not present

## 2019-04-30 DIAGNOSIS — R1013 Epigastric pain: Secondary | ICD-10-CM | POA: Diagnosis present

## 2019-04-30 DIAGNOSIS — K7031 Alcoholic cirrhosis of liver with ascites: Secondary | ICD-10-CM | POA: Diagnosis not present

## 2019-04-30 DIAGNOSIS — D696 Thrombocytopenia, unspecified: Secondary | ICD-10-CM | POA: Diagnosis not present

## 2019-04-30 DIAGNOSIS — A419 Sepsis, unspecified organism: Secondary | ICD-10-CM | POA: Diagnosis not present

## 2019-04-30 DIAGNOSIS — Z978 Presence of other specified devices: Secondary | ICD-10-CM

## 2019-04-30 DIAGNOSIS — M436 Torticollis: Secondary | ICD-10-CM | POA: Diagnosis present

## 2019-04-30 DIAGNOSIS — Z4659 Encounter for fitting and adjustment of other gastrointestinal appliance and device: Secondary | ICD-10-CM

## 2019-04-30 DIAGNOSIS — J690 Pneumonitis due to inhalation of food and vomit: Secondary | ICD-10-CM | POA: Diagnosis not present

## 2019-04-30 DIAGNOSIS — K8020 Calculus of gallbladder without cholecystitis without obstruction: Secondary | ICD-10-CM | POA: Diagnosis present

## 2019-04-30 DIAGNOSIS — N179 Acute kidney failure, unspecified: Secondary | ICD-10-CM | POA: Diagnosis not present

## 2019-04-30 DIAGNOSIS — E43 Unspecified severe protein-calorie malnutrition: Secondary | ICD-10-CM | POA: Diagnosis present

## 2019-04-30 DIAGNOSIS — T8119XA Other postprocedural shock, initial encounter: Secondary | ICD-10-CM | POA: Diagnosis not present

## 2019-04-30 DIAGNOSIS — G9340 Encephalopathy, unspecified: Secondary | ICD-10-CM | POA: Diagnosis not present

## 2019-04-30 DIAGNOSIS — I420 Dilated cardiomyopathy: Secondary | ICD-10-CM | POA: Diagnosis present

## 2019-04-30 DIAGNOSIS — G9341 Metabolic encephalopathy: Secondary | ICD-10-CM | POA: Diagnosis not present

## 2019-04-30 DIAGNOSIS — I700 Atherosclerosis of aorta: Secondary | ICD-10-CM | POA: Diagnosis present

## 2019-04-30 DIAGNOSIS — R570 Cardiogenic shock: Secondary | ICD-10-CM | POA: Diagnosis not present

## 2019-04-30 DIAGNOSIS — R109 Unspecified abdominal pain: Secondary | ICD-10-CM

## 2019-04-30 DIAGNOSIS — Z452 Encounter for adjustment and management of vascular access device: Secondary | ICD-10-CM

## 2019-04-30 DIAGNOSIS — R571 Hypovolemic shock: Secondary | ICD-10-CM | POA: Diagnosis not present

## 2019-04-30 DIAGNOSIS — Z9889 Other specified postprocedural states: Secondary | ICD-10-CM | POA: Diagnosis not present

## 2019-04-30 DIAGNOSIS — R579 Shock, unspecified: Secondary | ICD-10-CM | POA: Diagnosis not present

## 2019-04-30 DIAGNOSIS — I2510 Atherosclerotic heart disease of native coronary artery without angina pectoris: Secondary | ICD-10-CM | POA: Diagnosis present

## 2019-04-30 DIAGNOSIS — Z66 Do not resuscitate: Secondary | ICD-10-CM | POA: Diagnosis not present

## 2019-04-30 DIAGNOSIS — Z20828 Contact with and (suspected) exposure to other viral communicable diseases: Secondary | ICD-10-CM | POA: Diagnosis present

## 2019-04-30 DIAGNOSIS — I110 Hypertensive heart disease with heart failure: Secondary | ICD-10-CM | POA: Diagnosis present

## 2019-04-30 DIAGNOSIS — R6521 Severe sepsis with septic shock: Secondary | ICD-10-CM | POA: Diagnosis not present

## 2019-04-30 DIAGNOSIS — E871 Hypo-osmolality and hyponatremia: Secondary | ICD-10-CM | POA: Diagnosis not present

## 2019-04-30 DIAGNOSIS — Z7189 Other specified counseling: Secondary | ICD-10-CM | POA: Diagnosis not present

## 2019-04-30 DIAGNOSIS — J95821 Acute postprocedural respiratory failure: Secondary | ICD-10-CM | POA: Diagnosis not present

## 2019-04-30 DIAGNOSIS — I252 Old myocardial infarction: Secondary | ICD-10-CM

## 2019-04-30 DIAGNOSIS — J9690 Respiratory failure, unspecified, unspecified whether with hypoxia or hypercapnia: Secondary | ICD-10-CM

## 2019-04-30 DIAGNOSIS — Z515 Encounter for palliative care: Secondary | ICD-10-CM | POA: Diagnosis not present

## 2019-04-30 DIAGNOSIS — K265 Chronic or unspecified duodenal ulcer with perforation: Secondary | ICD-10-CM | POA: Diagnosis present

## 2019-04-30 DIAGNOSIS — J9602 Acute respiratory failure with hypercapnia: Secondary | ICD-10-CM | POA: Diagnosis not present

## 2019-04-30 DIAGNOSIS — I5023 Acute on chronic systolic (congestive) heart failure: Secondary | ICD-10-CM | POA: Diagnosis present

## 2019-04-30 DIAGNOSIS — K659 Peritonitis, unspecified: Secondary | ICD-10-CM | POA: Diagnosis present

## 2019-04-30 DIAGNOSIS — L8990 Pressure ulcer of unspecified site, unspecified stage: Secondary | ICD-10-CM

## 2019-04-30 DIAGNOSIS — R6520 Severe sepsis without septic shock: Secondary | ICD-10-CM | POA: Diagnosis not present

## 2019-04-30 DIAGNOSIS — Z880 Allergy status to penicillin: Secondary | ICD-10-CM

## 2019-04-30 DIAGNOSIS — J9601 Acute respiratory failure with hypoxia: Secondary | ICD-10-CM | POA: Diagnosis not present

## 2019-04-30 DIAGNOSIS — K5730 Diverticulosis of large intestine without perforation or abscess without bleeding: Secondary | ICD-10-CM | POA: Diagnosis present

## 2019-04-30 DIAGNOSIS — T17908A Unspecified foreign body in respiratory tract, part unspecified causing other injury, initial encounter: Secondary | ICD-10-CM

## 2019-04-30 DIAGNOSIS — K668 Other specified disorders of peritoneum: Secondary | ICD-10-CM

## 2019-04-30 DIAGNOSIS — Z6821 Body mass index (BMI) 21.0-21.9, adult: Secondary | ICD-10-CM

## 2019-04-30 DIAGNOSIS — Z9911 Dependence on respirator [ventilator] status: Secondary | ICD-10-CM | POA: Diagnosis not present

## 2019-04-30 DIAGNOSIS — Z7982 Long term (current) use of aspirin: Secondary | ICD-10-CM

## 2019-04-30 DIAGNOSIS — J9600 Acute respiratory failure, unspecified whether with hypoxia or hypercapnia: Secondary | ICD-10-CM

## 2019-04-30 DIAGNOSIS — Z01818 Encounter for other preprocedural examination: Secondary | ICD-10-CM

## 2019-04-30 HISTORY — DX: Alcoholic polyneuropathy: G62.1

## 2019-04-30 HISTORY — DX: Essential (primary) hypertension: I10

## 2019-04-30 HISTORY — DX: Chronic systolic (congestive) heart failure: I50.22

## 2019-04-30 HISTORY — DX: Peripheral vascular disease, unspecified: I73.9

## 2019-04-30 HISTORY — DX: Parkinson's disease without dyskinesia, without mention of fluctuations: G20.A1

## 2019-04-30 HISTORY — DX: Atherosclerotic heart disease of native coronary artery without angina pectoris: I25.10

## 2019-04-30 HISTORY — DX: Parkinson's disease: G20

## 2019-04-30 LAB — CBC WITH DIFFERENTIAL/PLATELET
Abs Immature Granulocytes: 0.01 10*3/uL (ref 0.00–0.07)
Basophils Absolute: 0 10*3/uL (ref 0.0–0.1)
Basophils Relative: 1 %
Eosinophils Absolute: 0 10*3/uL (ref 0.0–0.5)
Eosinophils Relative: 0 %
HCT: 38.4 % — ABNORMAL LOW (ref 39.0–52.0)
Hemoglobin: 13.1 g/dL (ref 13.0–17.0)
Immature Granulocytes: 0 %
Lymphocytes Relative: 18 %
Lymphs Abs: 0.4 10*3/uL — ABNORMAL LOW (ref 0.7–4.0)
MCH: 34.2 pg — ABNORMAL HIGH (ref 26.0–34.0)
MCHC: 34.1 g/dL (ref 30.0–36.0)
MCV: 100.3 fL — ABNORMAL HIGH (ref 80.0–100.0)
Monocytes Absolute: 0.2 10*3/uL (ref 0.1–1.0)
Monocytes Relative: 11 %
Neutro Abs: 1.6 10*3/uL — ABNORMAL LOW (ref 1.7–7.7)
Neutrophils Relative %: 70 %
Platelets: 154 10*3/uL (ref 150–400)
RBC: 3.83 MIL/uL — ABNORMAL LOW (ref 4.22–5.81)
RDW: 14.2 % (ref 11.5–15.5)
WBC: 2.2 10*3/uL — ABNORMAL LOW (ref 4.0–10.5)
nRBC: 0 % (ref 0.0–0.2)

## 2019-04-30 MED ORDER — MORPHINE SULFATE (PF) 4 MG/ML IV SOLN
4.0000 mg | Freq: Once | INTRAVENOUS | Status: AC
  Administered 2019-04-30: 22:00:00 4 mg via INTRAVENOUS
  Filled 2019-04-30: qty 1

## 2019-04-30 MED ORDER — CIPROFLOXACIN IN D5W 400 MG/200ML IV SOLN
400.0000 mg | Freq: Once | INTRAVENOUS | Status: AC
  Administered 2019-04-30: 400 mg via INTRAVENOUS
  Filled 2019-04-30: qty 200

## 2019-04-30 MED ORDER — IOPAMIDOL (ISOVUE-370) INJECTION 76%
125.0000 mL | Freq: Once | INTRAVENOUS | Status: AC | PRN
  Administered 2019-04-30: 23:00:00 125 mL via INTRAVENOUS

## 2019-04-30 MED ORDER — LACTATED RINGERS IV SOLN
INTRAVENOUS | Status: DC
  Administered 2019-05-01: 12:00:00 via INTRAVENOUS
  Administered 2019-05-01: 125 mL/h via INTRAVENOUS
  Administered 2019-05-01: 09:00:00 via INTRAVENOUS

## 2019-04-30 MED ORDER — METRONIDAZOLE IN NACL 5-0.79 MG/ML-% IV SOLN
500.0000 mg | Freq: Once | INTRAVENOUS | Status: DC
  Filled 2019-04-30: qty 100

## 2019-04-30 MED ORDER — HYDROMORPHONE HCL 1 MG/ML IJ SOLN: 0.5000 mg | INTRAMUSCULAR | Status: DC | PRN

## 2019-04-30 MED ORDER — ACETAMINOPHEN 325 MG PO TABS
650.0000 mg | ORAL_TABLET | Freq: Four times a day (QID) | ORAL | Status: DC | PRN
  Administered 2019-05-09 – 2019-05-16 (×3): 650 mg via ORAL
  Filled 2019-04-30 (×3): qty 2

## 2019-04-30 MED ORDER — SODIUM CHLORIDE 0.9 % IV BOLUS
1000.0000 mL | Freq: Once | INTRAVENOUS | Status: AC
  Administered 2019-04-30: 1000 mL via INTRAVENOUS

## 2019-04-30 MED ORDER — MORPHINE SULFATE (PF) 4 MG/ML IV SOLN
4.0000 mg | Freq: Once | INTRAVENOUS | Status: AC
  Administered 2019-04-30: 4 mg via INTRAVENOUS
  Filled 2019-04-30: qty 1

## 2019-04-30 MED ORDER — ACETAMINOPHEN 650 MG RE SUPP: 650.0000 mg | Freq: Four times a day (QID) | RECTAL | Status: DC | PRN

## 2019-04-30 MED ORDER — PANTOPRAZOLE SODIUM 40 MG IV SOLR
40.0000 mg | Freq: Every day | INTRAVENOUS | Status: DC
  Administered 2019-05-01 – 2019-05-06 (×6): 40 mg via INTRAVENOUS
  Filled 2019-04-30 (×6): qty 40

## 2019-04-30 NOTE — ED Notes (Signed)
ED TO INPATIENT HANDOFF REPORT  ED Nurse Name and Phone #: Rylan Kaufmann  S Name/Age/Gender Darren [name] 73 y.o. male Room/Bed: ED25A/ED25A  Code Status   Code Status: Full Code  Home/SNF/Other Home Patient oriented to: situation Is this baseline? Yes   Triage Complete: Triage complete  Chief Complaint Abd Pain  Triage Note Patient began having abdominal pain 3 hours ago. He had a normal bowel movement about 1500hrs today. Pain is worse when walking.   Allergies Allergies  Allergen Reactions  . Penicillins Hives    Level of Care/Admitting Diagnosis ED Disposition    ED Disposition Condition Comment   Admit  Hospital [facility]: [facility] [[id number]]  Level of Care: Med-Surg [16]  Covid Evaluation: N/A  Diagnosis: Perforated viscus [161096][298420]  Admitting Physician: Shana ChuteANNON, JENNIFER [[id number]]  Attending Physician: Shana ChuteANNON, JENNIFER [[id number]]  Estimated length of stay: 5 - 7 days  Certification:: I certify this patient will need inpatient services for at least 2 midnights  Bed request comments: 2C  PT Class (Do Not Modify): Inpatient [101]  PT Acc Code (Do Not Modify): Private [1]       B Medical/Surgery History Past Medical History:  Diagnosis Date  . Hypertension   . MI (myocardial infarction) (HCC) 2013      A IV Location/Drains/Wounds Patient Lines/Drains/Airways Status   Active Line/Drains/Airways    Name:   Placement date:   Placement time:   Site:   Days:   Peripheral IV 04/28/19 Left Forearm   04/28/19    2219    Forearm   less than 1          Intake/Output Last 24 hours No intake or output data in the 24 hours ending 04/28/19 2354  Labs/Imaging Results for orders placed or performed during the hospital encounter of 04/28/19 (from the past 48 hour(s))  CBC with Differential     Status: Abnormal   Collection Time: 04/28/19  9:56 PM  Result Value Ref Range   WBC 2.2 (L) 4.0 - 10.5 K/uL    Comment: WHITE COUNT CONFIRMED ON  SMEAR   RBC 3.83 (L) 4.22 - 5.81 MIL/uL   Hemoglobin 13.1 13.0 - 17.0 g/dL   HCT 04.538.4 (L) 40.939.0 - 81.152.0 %   MCV 100.3 (H) 80.0 - 100.0 fL   MCH 34.2 (H) 26.0 - 34.0 pg   MCHC 34.1 30.0 - 36.0 g/dL    Comment: CORRECTED FOR COLD AGGLUTININS   RDW 14.2 11.5 - 15.5 %   Platelets 154 150 - 400 K/uL   nRBC 0.0 0.0 - 0.2 %   Neutrophils Relative % 70 %   Neutro Abs 1.6 (L) 1.7 - 7.7 K/uL   Lymphocytes Relative 18 %   Lymphs Abs 0.4 (L) 0.7 - 4.0 K/uL   Monocytes Relative 11 %   Monocytes Absolute 0.2 0.1 - 1.0 K/uL   Eosinophils Relative 0 %   Eosinophils Absolute 0.0 0.0 - 0.5 K/uL   Basophils Relative 1 %   Basophils Absolute 0.0 0.0 - 0.1 K/uL   Immature Granulocytes 0 %   Abs Immature Granulocytes 0.01 0.00 - 0.07 K/uL    Comment: Performed at [facility], [address] [place], [place] [id number]  Type and screen [facility]     Status: None (Preliminary result)   Collection Time: 04/28/19 11:28 PM  Result Value Ref Range   ABO/RH(D) PENDING    Antibody Screen PENDING    Sample Expiration  05/03/2019,2359 Performed at [facility], [address], [place], [place] [address]    Ct Angio Chest/abd/pel For Dissection W And/or Wo Contrast  Result Date: 05/21/2019 CLINICAL DATA:  Chest and abdominal pain. EXAM: CT ANGIOGRAPHY CHEST, ABDOMEN AND PELVIS TECHNIQUE: Multidetector CT imaging through the chest, abdomen and pelvis was performed using the standard protocol during bolus administration of intravenous contrast. Multiplanar reconstructed images and MIPs were obtained and reviewed to evaluate the vascular anatomy. CONTRAST:  ISOVUE-370 IOPAMIDOL (ISOVUE-370) INJECTION 76% COMPARISON:  Chest CT 04/05/2018 FINDINGS: CTA CHEST FINDINGS Cardiovascular: Diffuse aortic atherosclerosis and coronary artery calcifications. No aneurysm or dissection. Heart is mildly enlarged. Calcification and wall thickening at the left ventricle apex  compatible with prior infarct. Mediastinum/Nodes: No mediastinal, hilar, or axillary adenopathy. Trachea and esophagus are unremarkable. Thyroid unremarkable. Lungs/Pleura: Dependent opacities noted in both lower lungs/lung bases compatible with dependent atelectasis or scarring. No effusions. Musculoskeletal: Chest wall soft tissues are unremarkable. No acute bony abnormality. Review of the MIP images confirms the above findings. CTA ABDOMEN AND PELVIS FINDINGS VASCULAR Aorta: Atherosclerotic calcifications throughout the abdominal aorta. No aneurysm or dissection. Celiac: Patent without evidence of aneurysm, dissection, vasculitis or significant stenosis. SMA: Patent without evidence of aneurysm, dissection, vasculitis or significant stenosis. Renals: Calcified plaque at the origin of both renal arteries. Suspect mild stenosis at the origin of the left renal artery. Right renal artery appears widely patent. Small accessory left renal artery noted. IMA: Patent. Inflow: Atherosclerotic calcifications throughout the iliac vessels. No aneurysm, dissection or significant stenosis. Veins: No obvious venous abnormality within the limitations of this arterial phase study. Review of the MIP images confirms the above findings. NON-VASCULAR Hepatobiliary: Small gallstones within the gallbladder. Liver appears shrunken with probable micro nodularity along the surface suggesting cirrhosis. Gallbladder is moderately distended. No biliary ductal dilatation. Pancreas: No focal abnormality or ductal dilatation. Spleen: No focal abnormality.  Normal size. Adrenals/Urinary Tract: Left renal cysts. No suspicious renal or adrenal mass. No hydronephrosis. Urinary bladder unremarkable. Stomach/Bowel: Sigmoid diverticulosis. No active diverticulitis. There is abnormal wall thickening within the cecum and ascending colon concerning for colitis. There is indistinctness and wall thickness within the 1st and 2nd portions of the duodenum.  This is concerning for duodenitis, likely peptic ulcer disease. No evidence of bowel obstruction. Lymphatic: No adenopathy Reproductive: No visible focal abnormality. Other: Moderate to large volume ascites in the abdomen and pelvis. There is free air anteriorly within the upper abdomen and in the porta hepatis. Musculoskeletal: No acute bony abnormality. Review of the MIP images confirms the above findings. IMPRESSION: Diffuse aortic atherosclerosis. No evidence of aneurysm or dissection. Diffuse coronary artery disease. Cardiomegaly. 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 Dependent atelectasis or scarring in the lower lobes. Free intraperitoneal air. There are 2 areas of abnormal bowel, within the 1st and 2nd portions of the duodenum where there is wall thickening and indistinctness concerning for peptic ulcer disease. I suspect this is the source of the perforation and free air. However, the cecum and ascending colon also appears thick walled concerning for colitis. Appearance of the liver is suspicious for cirrhosis. Recommend clinical correlation. Moderate to large volume ascites. Cholelithiasis. Sigmoid diverticulosis. Critical Value/emergent results were called by telephone at the time of interpretation on 05/24/2019 at 11:33 pm to Dr. Phineas Semen , who verbally acknowledged these results. Electronically Signed   By: Charlett Nose M.D.   On: 05/26/2019 23:36    Pending Labs [employer] (From  admission, onward)    Start     Ordered   05/06/2019 2344  HIV antibody (Routine Testing)  Once,   STAT     05/20/2019 2350   05/06/2019 2315  Comprehensive metabolic panel  Once,   STAT     05/25/2019 2315   04/29/2019 2315  Lipase, blood  Once,   STAT     05/08/2019 2315   05/23/2019 2211  Urinalysis, Complete w Microscopic  Once,   STAT     05/22/2019 2210          Vitals/Pain Today's Vitals   04/26/2019 2156 05/14/2019 2157 05/23/2019 2200 05/09/2019 2224  BP:  127/84  112/80   Pulse: (!) 142     Resp: (!) 28  (!) 28   Temp: 99.6 F (37.6 C)     TempSrc:      SpO2: 96%     Weight:      Height:      PainSc:  10-Worst pain ever  10-Worst pain ever    Isolation Precautions No active isolations  Medications Medications  ciprofloxacin (CIPRO) IVPB 400 mg (has no administration in time range)  metroNIDAZOLE (FLAGYL) IVPB 500 mg (has no administration in time range)  sodium chloride 0.9 % bolus 1,000 mL (has no administration in time range)  morphine 4 MG/ML injection 4 mg (has no administration in time range)  lactated ringers infusion (has no administration in time range)  acetaminophen (TYLENOL) tablet 650 mg (has no administration in time range)    Or  acetaminophen (TYLENOL) suppository 650 mg (has no administration in time range)  HYDROmorphone (DILAUDID) injection 0.5 mg (has no administration in time range)  pantoprazole (PROTONIX) injection 40 mg (has no administration in time range)  morphine 4 MG/ML injection 4 mg (4 mg Intravenous Given 05/23/2019 2222)  iopamidol (ISOVUE-370) 76 % injection 125 mL (125 mLs Intravenous Contrast Given 05/04/2019 2307)    Mobility walks Moderate fall risk   Focused Assessments    R Recommendations: See Admitting Provider Note  Report given to:   Additional Notes:

## 2019-04-30 NOTE — ED Notes (Signed)
Pt is going to medical imaging.   

## 2019-04-30 NOTE — ED Provider Notes (Signed)
[facility] Emergency Department Provider Note  ____________________________________________   I have reviewed the triage vital signs and the nursing notes.   HISTORY  Chief Complaint Abdominal Pain   History limited by: Not Limited   HPI Darren [name] is a 73 y.o. male who presents to the emergency department today because of concern for abdominal pain. It started suddenly this evening. Located in the lower abdomen. The patient states that the pain is severe. Has been constant since it started. He has not noticed any recent change in his bowel movements or urine. Denies any nausea or vomiting. Denies any fevers recently. Denies history of similar pain in the past. No trauma.    Records reviewed. Per medical record review patient has a history of HTN, MI.  Past Medical History:  Diagnosis Date  . Hypertension   . MI (myocardial infarction) 2013    There are no active problems to display for this patient.   Prior to Admission medications   Not on File    Allergies Penicillins  No family history on file.  Social History Social History   Tobacco Use  . Smoking status: Not on file  Substance Use Topics  . Alcohol use: Not on file  . Drug use: Not on file    Review of Systems Constitutional: No fever/chills Eyes: No visual changes. ENT: No sore throat. Cardiovascular: Denies chest pain. Respiratory: Denies shortness of breath. Gastrointestinal: Positive for lower abdominal pain.  Genitourinary: Negative for dysuria. Musculoskeletal: Negative for back pain. Skin: Negative for rash. Neurological: Negative for headaches, focal weakness or numbness.  ____________________________________________   PHYSICAL EXAM:  VITAL SIGNS: ED Triage Vitals  Enc Vitals Group     BP 2019-05-05 2147 127/84     Pulse Rate May 05, 2019 2147 (!) 143     Resp May 05, 2019 2156 (!) 28     Temp May 05, 2019 2147 99.6 F (37.6 C)     Temp Source 2019/05/05 2147  Axillary     SpO2 05-05-2019 2147 95 %     Weight 05/05/2019 2150 176 lb (79.8 kg)     Height 05/05/19 2150 6' (1.829 m)     Head Circumference --      Peak Flow --      Pain Score 2019/05/05 2150 10   Constitutional: Alert and oriented.  Eyes: Conjunctivae are normal.  ENT      Head: Normocephalic and atraumatic.      Nose: No congestion/rhinnorhea.      Mouth/Throat: Mucous membranes are moist.      Neck: No stridor. Hematological/Lymphatic/Immunilogical: No cervical lymphadenopathy. Cardiovascular: Tachycardic, regular rhythm.  No murmurs, rubs, or gallops.  Respiratory: Tachypnea. Bilateral lungs clear.  Gastrointestinal: Soft and tender to palpation.  Genitourinary: Deferred Musculoskeletal: Normal range of motion in all extremities. No lower extremity edema. Neurologic:  Normal speech and language. No gross focal neurologic deficits are appreciated.  Skin:  Skin is warm, dry and intact. No rash noted. Psychiatric: Mood and affect are normal. Speech and behavior are normal. Patient exhibits appropriate insight and judgment.  ____________________________________________    LABS (pertinent positives/negatives)  CBC wbc 2.2, hgb 13.1, plt 154 CMP pending at time of admission  ____________________________________________   EKG  None  ____________________________________________    RADIOLOGY  CT angio dissection Free intraperitoneal air concerning for duodenal ulcer perforation.   I, Phineas Semen, personally discussed these images and results by phone with the on-call radiologist and used this discussion as part of my medical decision making.  ____________________________________________   PROCEDURES  Procedures  CRITICAL CARE Performed by: Phineas SemenGraydon Candido Flott   Total critical care time: 35 minutes  Critical care time was exclusive of separately billable procedures and treating other patients.  Critical care was necessary to treat or prevent imminent or  life-threatening deterioration.  Critical care was time spent personally by me on the following activities: development of treatment plan with patient and/or surrogate as well as nursing, discussions with consultants, evaluation of patient's response to treatment, examination of patient, obtaining history from patient or surrogate, ordering and performing treatments and interventions, ordering and review of laboratory studies, ordering and review of radiographic studies, pulse oximetry and re-evaluation of patient's condition.  ____________________________________________   INITIAL IMPRESSION / ASSESSMENT AND PLAN / ED COURSE  Pertinent labs & imaging results that were available during my care of the patient were reviewed by me and considered in my medical decision making (see chart for details).   Patient presented to the emergency department today because of concerns for abdominal pain.  He stated it did occur suddenly.  Patient was noted be quite tachycardic here in the emergency department and was significantly tender in the abdomen.  It was soft.  Given tachycardia and tenderness I did have concern for possible dissection.  CT angios was performed.  While this did not show a dissection it was concerning for free intraperitoneal peritoneal air and likely perforated duodenal ulcer.  I did consult surgery and discussed this finding with the patient.  Additionally will start antibiotics.  ____________________________________________   FINAL CLINICAL IMPRESSION(S) / ED DIAGNOSES  Final diagnoses:  Abdominal pain, unspecified abdominal location  Free intraperitoneal air     Note: This dictation was prepared with Dragon dictation. Any transcriptional errors that result from this process are unintentional     Phineas SemenGoodman, Jarelle Ates, MD 07-20-2019 1616

## 2019-04-30 NOTE — ED Notes (Signed)
Pt is back from medical imaging. 

## 2019-04-30 NOTE — ED Triage Notes (Signed)
Patient began having abdominal pain 3 hours ago. He had a normal bowel movement about 1500hrs today. Pain is worse when walking.

## 2019-05-01 ENCOUNTER — Encounter: Admission: EM | Disposition: E | Payer: Self-pay | Source: Home / Self Care | Attending: General Surgery

## 2019-05-01 ENCOUNTER — Inpatient Hospital Stay: Payer: Medicare HMO

## 2019-05-01 ENCOUNTER — Encounter: Payer: Self-pay | Admitting: General Surgery

## 2019-05-01 ENCOUNTER — Inpatient Hospital Stay: Payer: Medicare HMO | Admitting: Anesthesiology

## 2019-05-01 ENCOUNTER — Telehealth: Payer: Self-pay | Admitting: General Surgery

## 2019-05-01 ENCOUNTER — Inpatient Hospital Stay (HOSPITAL_COMMUNITY)
Admit: 2019-05-01 | Discharge: 2019-05-01 | Disposition: A | Discharge status: Home / Self Care | Payer: Medicare HMO | Attending: Pulmonary Disease | Admitting: Pulmonary Disease

## 2019-05-01 DIAGNOSIS — R579 Shock, unspecified: Secondary | ICD-10-CM

## 2019-05-01 DIAGNOSIS — T8119XA Other postprocedural shock, initial encounter: Secondary | ICD-10-CM

## 2019-05-01 DIAGNOSIS — R571 Hypovolemic shock: Secondary | ICD-10-CM

## 2019-05-01 DIAGNOSIS — R198 Other specified symptoms and signs involving the digestive system and abdomen: Secondary | ICD-10-CM

## 2019-05-01 DIAGNOSIS — R6521 Severe sepsis with septic shock: Secondary | ICD-10-CM

## 2019-05-01 DIAGNOSIS — A419 Sepsis, unspecified organism: Secondary | ICD-10-CM

## 2019-05-01 HISTORY — PX: LAPAROTOMY: SHX154

## 2019-05-01 LAB — CBC WITH DIFFERENTIAL/PLATELET
Abs Immature Granulocytes: 0.4 10*3/uL — ABNORMAL HIGH (ref 0.00–0.07)
Band Neutrophils: 43 %
Basophils Absolute: 0 10*3/uL (ref 0.0–0.1)
Basophils Relative: 0 %
Eosinophils Absolute: 0 10*3/uL (ref 0.0–0.5)
Eosinophils Relative: 0 %
HCT: 23.9 % — ABNORMAL LOW (ref 39.0–52.0)
Hemoglobin: 8.2 g/dL — ABNORMAL LOW (ref 13.0–17.0)
Lymphocytes Relative: 1 %
Lymphs Abs: 0.1 10*3/uL — ABNORMAL LOW (ref 0.7–4.0)
MCH: 33.7 pg (ref 26.0–34.0)
MCHC: 34.3 g/dL (ref 30.0–36.0)
MCV: 98.4 fL (ref 80.0–100.0)
Metamyelocytes Relative: 3 %
Monocytes Absolute: 0.7 10*3/uL (ref 0.1–1.0)
Monocytes Relative: 5 %
Neutro Abs: 11.9 10*3/uL — ABNORMAL HIGH (ref 1.7–7.7)
Neutrophils Relative %: 48 %
Platelets: 115 10*3/uL — ABNORMAL LOW (ref 150–400)
RBC: 2.43 MIL/uL — ABNORMAL LOW (ref 4.22–5.81)
RDW: 16 % — ABNORMAL HIGH (ref 11.5–15.5)
Smear Review: NORMAL
WBC: 13.1 10*3/uL — ABNORMAL HIGH (ref 4.0–10.5)
nRBC: 0 % (ref 0.0–0.2)

## 2019-05-01 LAB — COMPREHENSIVE METABOLIC PANEL
ALT: 70 U/L — ABNORMAL HIGH (ref 0–44)
AST: 136 U/L — ABNORMAL HIGH (ref 15–41)
Albumin: 2.2 g/dL — ABNORMAL LOW (ref 3.5–5.0)
Alkaline Phosphatase: 143 U/L — ABNORMAL HIGH (ref 38–126)
Anion gap: 13 (ref 5–15)
BUN: 20 mg/dL (ref 8–23)
CO2: 20 mmol/L — ABNORMAL LOW (ref 22–32)
Calcium: 7.3 mg/dL — ABNORMAL LOW (ref 8.9–10.3)
Chloride: 89 mmol/L — ABNORMAL LOW (ref 98–111)
Creatinine, Ser: 2.01 mg/dL — ABNORMAL HIGH (ref 0.61–1.24)
GFR calc Af Amer: 38 mL/min — ABNORMAL LOW (ref >60)
GFR calc non Af Amer: 33 mL/min — ABNORMAL LOW (ref >60)
Glucose, Bld: 96 mg/dL (ref 70–99)
Potassium: 3.8 mmol/L (ref 3.5–5.1)
Sodium: 122 mmol/L — ABNORMAL LOW (ref 135–145)
Total Bilirubin: 2.3 mg/dL — ABNORMAL HIGH (ref 0.3–1.2)
Total Protein: 4.7 g/dL — ABNORMAL LOW (ref 6.5–8.1)

## 2019-05-01 LAB — BASIC METABOLIC PANEL
Anion gap: 11 (ref 5–15)
BUN: 18 mg/dL (ref 8–23)
CO2: 17 mmol/L — ABNORMAL LOW (ref 22–32)
Calcium: 6.6 mg/dL — ABNORMAL LOW (ref 8.9–10.3)
Chloride: 100 mmol/L (ref 98–111)
Creatinine, Ser: 1.41 mg/dL — ABNORMAL HIGH (ref 0.61–1.24)
GFR calc Af Amer: 58 mL/min — ABNORMAL LOW (ref >60)
GFR calc non Af Amer: 50 mL/min — ABNORMAL LOW (ref >60)
Glucose, Bld: 106 mg/dL — ABNORMAL HIGH (ref 70–99)
Potassium: 3.8 mmol/L (ref 3.5–5.1)
Sodium: 128 mmol/L — ABNORMAL LOW (ref 135–145)

## 2019-05-01 LAB — BLOOD GAS, ARTERIAL
Acid-base deficit: 9.6 mmol/L — ABNORMAL HIGH (ref 0.0–2.0)
Acid-base deficit: 10.7 mmol/L — ABNORMAL HIGH (ref 0.0–2.0)
Acid-base deficit: 10.6 mmol/L — ABNORMAL HIGH (ref 0.0–2.0)
Bicarbonate: 17.6 mmol/L — ABNORMAL LOW (ref 20.0–28.0)
Bicarbonate: 15.9 mmol/L — ABNORMAL LOW (ref 20.0–28.0)
Bicarbonate: 16.3 mmol/L — ABNORMAL LOW (ref 20.0–28.0)
FIO2: 0.5
FIO2: 0.5
FIO2: 0.4
MECHVT: 450 mL
MECHVT: 450 mL
MECHVT: 450 mL
O2 Saturation: 97.5 %
O2 Saturation: 94.6 %
O2 Saturation: 94 %
PEEP: 5 cmH2O
PEEP: 5 cmH2O
PEEP: 5 cmH2O
Patient temperature: 37
Patient temperature: 37
Patient temperature: 37
RATE: 12 resp/min
RATE: 14 resp/min
RATE: 14 resp/min
pCO2 arterial: 42 mmHg (ref 32.0–48.0)
pCO2 arterial: 37 mmHg (ref 32.0–48.0)
pCO2 arterial: 39 mmHg (ref 32.0–48.0)
pH, Arterial: 7.23 — ABNORMAL LOW (ref 7.350–7.450)
pH, Arterial: 7.24 — ABNORMAL LOW (ref 7.350–7.450)
pH, Arterial: 7.23 — ABNORMAL LOW (ref 7.350–7.450)
pO2, Arterial: 113 mmHg — ABNORMAL HIGH (ref 83.0–108.0)
pO2, Arterial: 86 mmHg (ref 83.0–108.0)
pO2, Arterial: 84 mmHg (ref 83.0–108.0)

## 2019-05-01 LAB — LACTIC ACID, PLASMA
Lactic Acid, Venous: 3.8 mmol/L (ref 0.5–1.9)
Lactic Acid, Venous: 3.1 mmol/L (ref 0.5–1.9)

## 2019-05-01 LAB — TRIGLYCERIDES: Triglycerides: 53 mg/dL (ref <150)

## 2019-05-01 LAB — SARS CORONAVIRUS 2 BY RT PCR (HOSPITAL ORDER, PERFORMED IN ~~LOC~~ HOSPITAL LAB): SARS Coronavirus 2: NEGATIVE

## 2019-05-01 LAB — HEMOGLOBIN AND HEMATOCRIT, BLOOD
HCT: 28.1 % — ABNORMAL LOW (ref 39.0–52.0)
Hemoglobin: 9.5 g/dL — ABNORMAL LOW (ref 13.0–17.0)

## 2019-05-01 LAB — ECHOCARDIOGRAM COMPLETE
Height: 72 in
Weight: 2864.22 oz

## 2019-05-01 LAB — ABO/RH: ABO/RH(D): O POS

## 2019-05-01 LAB — PREPARE RBC (CROSSMATCH)

## 2019-05-01 LAB — PROCALCITONIN
Procalcitonin: 19.94 ng/mL
Procalcitonin: 19.5 ng/mL

## 2019-05-01 LAB — CORTISOL: Cortisol, Plasma: 42.2 ug/dL

## 2019-05-01 LAB — LIPASE, BLOOD: Lipase: 52 U/L — ABNORMAL HIGH (ref 11–51)

## 2019-05-01 LAB — GLUCOSE, CAPILLARY: Glucose-Capillary: 77 mg/dL (ref 70–99)

## 2019-05-01 SURGERY — LAPAROTOMY, EXPLORATORY
Anesthesia: General | Site: Abdomen

## 2019-05-01 MED ORDER — METRONIDAZOLE IN NACL 5-0.79 MG/ML-% IV SOLN
INTRAVENOUS | Status: DC | PRN
  Administered 2019-05-01: 500 mg via INTRAVENOUS

## 2019-05-01 MED ORDER — POTASSIUM CHLORIDE 2 MEQ/ML IV SOLN
INTRAVENOUS | Status: DC
  Administered 2019-05-01 – 2019-05-02 (×2): via INTRAVENOUS
  Filled 2019-05-01 (×3): qty 1000

## 2019-05-01 MED ORDER — SUCCINYLCHOLINE CHLORIDE 20 MG/ML IJ SOLN
INTRAMUSCULAR | Status: AC
  Filled 2019-05-01: qty 1

## 2019-05-01 MED ORDER — ROCURONIUM BROMIDE 50 MG/5ML IV SOLN
INTRAVENOUS | Status: AC
  Filled 2019-05-01: qty 1

## 2019-05-01 MED ORDER — PROPOFOL 10 MG/ML IV BOLUS
INTRAVENOUS | Status: DC | PRN
  Administered 2019-05-01: 80 mg via INTRAVENOUS

## 2019-05-01 MED ORDER — SODIUM CHLORIDE 0.9 % IV SOLN
1.0000 g | Freq: Three times a day (TID) | INTRAVENOUS | Status: AC
  Administered 2019-05-01 – 2019-05-08 (×23): 1 g via INTRAVENOUS
  Filled 2019-05-01 (×24): qty 1

## 2019-05-01 MED ORDER — VASOPRESSIN 20 UNIT/ML IV SOLN
INTRAVENOUS | Status: DC | PRN
  Administered 2019-05-01 (×3): 1 [IU] via INTRAVENOUS
  Administered 2019-05-01: 2 [IU] via INTRAVENOUS
  Administered 2019-05-01 (×2): 1 [IU] via INTRAVENOUS

## 2019-05-01 MED ORDER — SODIUM CHLORIDE 0.9 % IV SOLN
INTRAVENOUS | Status: AC
  Administered 2019-05-01: 500 mL/h via INTRAVENOUS

## 2019-05-01 MED ORDER — CHLORHEXIDINE GLUCONATE 0.12% ORAL RINSE (MEDLINE KIT): 15.0000 mL | Freq: Two times a day (BID) | OROMUCOSAL | Status: DC

## 2019-05-01 MED ORDER — FENTANYL 2500MCG IN NS 250ML (10MCG/ML) PREMIX INFUSION
0.0000 ug/h | INTRAVENOUS | Status: DC
  Administered 2019-05-01: 50 ug/h via INTRAVENOUS
  Administered 2019-05-02 – 2019-05-03 (×3): 200 ug/h via INTRAVENOUS
  Filled 2019-05-01 (×4): qty 250

## 2019-05-01 MED ORDER — LACTATED RINGERS IV SOLN
INTRAVENOUS | Status: DC | PRN
  Administered 2019-05-01 (×3): via INTRAVENOUS

## 2019-05-01 MED ORDER — ORAL CARE MOUTH RINSE
15.0000 mL | OROMUCOSAL | Status: DC
  Administered 2019-05-01 – 2019-05-03 (×25): 15 mL via OROMUCOSAL

## 2019-05-01 MED ORDER — SODIUM CHLORIDE 0.9 % IV BOLUS
1000.0000 mL | Freq: Once | INTRAVENOUS | Status: AC
  Administered 2019-05-01: 1000 mL via INTRAVENOUS

## 2019-05-01 MED ORDER — FENTANYL CITRATE (PF) 100 MCG/2ML IJ SOLN
INTRAMUSCULAR | Status: DC | PRN
  Administered 2019-05-01 (×2): 50 ug via INTRAVENOUS
  Administered 2019-05-01: 100 ug via INTRAVENOUS

## 2019-05-01 MED ORDER — FLUCONAZOLE IN SODIUM CHLORIDE 400-0.9 MG/200ML-% IV SOLN
400.0000 mg | Freq: Every day | INTRAVENOUS | Status: AC
  Administered 2019-05-02 – 2019-05-08 (×7): 400 mg via INTRAVENOUS
  Filled 2019-05-01 (×8): qty 200

## 2019-05-01 MED ORDER — PIPERACILLIN-TAZOBACTAM 3.375 G IVPB: 3.3750 g | Freq: Three times a day (TID) | INTRAVENOUS | Status: DC

## 2019-05-01 MED ORDER — FLUCONAZOLE IN SODIUM CHLORIDE 400-0.9 MG/200ML-% IV SOLN
800.0000 mg | Freq: Once | INTRAVENOUS | Status: AC
  Administered 2019-05-01: 15:00:00 800 mg via INTRAVENOUS
  Filled 2019-05-01 (×2): qty 400

## 2019-05-01 MED ORDER — SODIUM CHLORIDE 0.9 % IV SOLN
1.0000 g | Freq: Two times a day (BID) | INTRAVENOUS | Status: DC
  Administered 2019-05-01: 1 g via INTRAVENOUS
  Filled 2019-05-01 (×2): qty 1

## 2019-05-01 MED ORDER — SODIUM CHLORIDE 0.9 % IV SOLN
0.0000 ug/min | INTRAVENOUS | Status: DC
  Administered 2019-05-01: 125 ug/min via INTRAVENOUS
  Administered 2019-05-01: 200 ug/min via INTRAVENOUS
  Administered 2019-05-01: 60 ug/min via INTRAVENOUS
  Administered 2019-05-01: 12:00:00 25 ug/min via INTRAVENOUS
  Filled 2019-05-01 (×2): qty 10
  Filled 2019-05-01 (×2): qty 1
  Filled 2019-05-01: qty 10

## 2019-05-01 MED ORDER — SODIUM CHLORIDE 0.9 % IV SOLN
INTRAVENOUS | Status: DC | PRN
  Administered 2019-05-10: 250 mL via INTRAVENOUS

## 2019-05-01 MED ORDER — ALBUMIN HUMAN 25 % IV SOLN
25.0000 g | Freq: Once | INTRAVENOUS | Status: AC
  Administered 2019-05-02: 01:00:00 25 g via INTRAVENOUS
  Filled 2019-05-01 (×4): qty 100

## 2019-05-01 MED ORDER — ALBUMIN HUMAN 25 % IV SOLN
12.5000 g | Freq: Once | INTRAVENOUS | Status: AC
  Administered 2019-05-01: 12:00:00 12.5 g via INTRAVENOUS
  Filled 2019-05-01: qty 50

## 2019-05-01 MED ORDER — ALBUMIN HUMAN 5 % IV SOLN
INTRAVENOUS | Status: AC
  Filled 2019-05-01: qty 250

## 2019-05-01 MED ORDER — METRONIDAZOLE IN NACL 5-0.79 MG/ML-% IV SOLN: 500.0000 mg | Freq: Three times a day (TID) | INTRAVENOUS | Status: DC

## 2019-05-01 MED ORDER — PHENYLEPHRINE HCL (PRESSORS) 10 MG/ML IV SOLN
INTRAVENOUS | Status: DC | PRN
  Administered 2019-05-01: 240 ug via INTRAVENOUS
  Administered 2019-05-01 (×2): 160 ug via INTRAVENOUS
  Administered 2019-05-01: 240 ug via INTRAVENOUS

## 2019-05-01 MED ORDER — PROPOFOL 500 MG/50ML IV EMUL
INTRAVENOUS | Status: DC | PRN
  Administered 2019-05-01: 10 ug/kg/min via INTRAVENOUS

## 2019-05-01 MED ORDER — LIDOCAINE HCL (CARDIAC) PF 100 MG/5ML IV SOSY
PREFILLED_SYRINGE | INTRAVENOUS | Status: DC | PRN
  Administered 2019-05-01: 80 mg via INTRAVENOUS

## 2019-05-01 MED ORDER — FENTANYL BOLUS VIA INFUSION
25.0000 ug | INTRAVENOUS | Status: DC | PRN
  Filled 2019-05-01: qty 25

## 2019-05-01 MED ORDER — ALBUMIN HUMAN 5 % IV SOLN
INTRAVENOUS | Status: AC
  Filled 2019-05-01: qty 500

## 2019-05-01 MED ORDER — SUCCINYLCHOLINE CHLORIDE 20 MG/ML IJ SOLN
INTRAMUSCULAR | Status: DC | PRN
  Administered 2019-05-01: 140 mg via INTRAVENOUS

## 2019-05-01 MED ORDER — THIAMINE HCL 100 MG/ML IJ SOLN
INTRAVENOUS | Status: DC
  Administered 2019-05-01 – 2019-05-03 (×3): via INTRAVENOUS
  Filled 2019-05-01 (×4): qty 1000

## 2019-05-01 MED ORDER — PROPOFOL 1000 MG/100ML IV EMUL
5.0000 ug/kg/min | INTRAVENOUS | Status: DC
  Administered 2019-05-01: 05:00:00 20 ug/kg/min via INTRAVENOUS
  Filled 2019-05-01: qty 100

## 2019-05-01 MED ORDER — LIDOCAINE HCL (PF) 2 % IJ SOLN
INTRAMUSCULAR | Status: AC
  Filled 2019-05-01: qty 10

## 2019-05-01 MED ORDER — LORAZEPAM 2 MG/ML IJ SOLN
1.0000 mg | INTRAMUSCULAR | Status: DC | PRN
  Administered 2019-05-02: 2 mg via INTRAVENOUS
  Filled 2019-05-01: qty 1

## 2019-05-01 MED ORDER — SODIUM CHLORIDE 0.9 % IV SOLN
INTRAVENOUS | Status: DC | PRN
  Administered 2019-05-01: 50 ug/min via INTRAVENOUS

## 2019-05-01 MED ORDER — PROPOFOL 10 MG/ML IV BOLUS
INTRAVENOUS | Status: AC
  Filled 2019-05-01: qty 20

## 2019-05-01 MED ORDER — NOREPINEPHRINE 4 MG/250ML-% IV SOLN: 0.0000 ug/min | INTRAVENOUS | Status: DC

## 2019-05-01 MED ORDER — ORAL CARE MOUTH RINSE: 15.0000 mL | OROMUCOSAL | Status: DC

## 2019-05-01 MED ORDER — FENTANYL CITRATE (PF) 100 MCG/2ML IJ SOLN: 25.0000 ug | INTRAMUSCULAR | Status: DC | PRN

## 2019-05-01 MED ORDER — PROPOFOL 500 MG/50ML IV EMUL
INTRAVENOUS | Status: AC
  Filled 2019-05-01: qty 50

## 2019-05-01 MED ORDER — FAMOTIDINE IN NACL 20-0.9 MG/50ML-% IV SOLN: 20.0000 mg | Freq: Two times a day (BID) | INTRAVENOUS | Status: DC

## 2019-05-01 MED ORDER — DEXMEDETOMIDINE HCL IN NACL 400 MCG/100ML IV SOLN
0.4000 ug/kg/h | INTRAVENOUS | Status: DC
  Administered 2019-05-01: 1 ug/kg/h via INTRAVENOUS
  Administered 2019-05-01 (×2): 1.2 ug/kg/h via INTRAVENOUS
  Administered 2019-05-02 – 2019-05-03 (×4): 0.6 ug/kg/h via INTRAVENOUS
  Filled 2019-05-01 (×7): qty 100

## 2019-05-01 MED ORDER — ALBUMIN HUMAN 5 % IV SOLN
INTRAVENOUS | Status: DC | PRN
  Administered 2019-05-01 (×3): via INTRAVENOUS

## 2019-05-01 MED ORDER — FENTANYL CITRATE (PF) 100 MCG/2ML IJ SOLN
INTRAMUSCULAR | Status: AC
  Filled 2019-05-01: qty 2

## 2019-05-01 MED ORDER — CHLORHEXIDINE GLUCONATE 0.12% ORAL RINSE (MEDLINE KIT)
15.0000 mL | Freq: Two times a day (BID) | OROMUCOSAL | Status: DC
  Administered 2019-05-01 – 2019-05-03 (×6): 15 mL via OROMUCOSAL

## 2019-05-01 MED ORDER — PHENYLEPHRINE HCL (PRESSORS) 10 MG/ML IV SOLN
INTRAVENOUS | Status: AC
  Filled 2019-05-01: qty 1

## 2019-05-01 MED ORDER — SODIUM CHLORIDE 0.9 % IV SOLN
INTRAVENOUS | Status: DC | PRN
  Administered 2019-05-01: 01:00:00 via INTRAVENOUS

## 2019-05-01 MED ORDER — ROCURONIUM BROMIDE 100 MG/10ML IV SOLN
INTRAVENOUS | Status: DC | PRN
  Administered 2019-05-01: 50 mg via INTRAVENOUS
  Administered 2019-05-01: 20 mg via INTRAVENOUS
  Administered 2019-05-01: 30 mg via INTRAVENOUS

## 2019-05-01 MED ORDER — IPRATROPIUM-ALBUTEROL 0.5-2.5 (3) MG/3ML IN SOLN: 3.0000 mL | RESPIRATORY_TRACT | Status: DC | PRN

## 2019-05-01 MED ORDER — NOREPINEPHRINE 16 MG/250ML-% IV SOLN
0.0000 ug/min | INTRAVENOUS | Status: DC
  Administered 2019-05-01: 11:00:00 10 ug/min via INTRAVENOUS
  Administered 2019-05-01: 24 ug/min via INTRAVENOUS
  Filled 2019-05-01 (×3): qty 250

## 2019-05-01 SURGICAL SUPPLY — 55 items
BULB RESERV EVAC DRAIN JP 100C (MISCELLANEOUS) ×2 IMPLANT
CANISTER SUCT 1200ML W/VALVE (MISCELLANEOUS) IMPLANT
CANISTER SUCT 3000ML PPV (MISCELLANEOUS) ×4 IMPLANT
CHLORAPREP W/TINT 26 (MISCELLANEOUS) ×2 IMPLANT
COVER BACK TABLE 80X110 HD (DRAPES) ×2 IMPLANT
COVER WAND RF STERILE (DRAPES) IMPLANT
DERMABOND ADVANCED (GAUZE/BANDAGES/DRESSINGS) ×1
DERMABOND ADVANCED .7 DNX12 (GAUZE/BANDAGES/DRESSINGS) ×1 IMPLANT
DRAIN CHANNEL JP 19F (MISCELLANEOUS) ×2 IMPLANT
DRAPE LAPAROTOMY 100X77 ABD (DRAPES) ×2 IMPLANT
DRAPE UTILITY 15X26 TOWEL STRL (DRAPES) IMPLANT
DRSG TELFA 4X14 ISLAND NADH (GAUZE/BANDAGES/DRESSINGS) IMPLANT
DRSG TELFA 4X8 ISLAND PHMB (GAUZE/BANDAGES/DRESSINGS) ×4 IMPLANT
ELECT CAUTERY BLADE TIP 2.5 (TIP) ×2
ELECT EZSTD 165MM 6.5IN (MISCELLANEOUS) ×2
ELECT REM PT RETURN 9FT ADLT (ELECTROSURGICAL) ×2
ELECTRODE CAUTERY BLDE TIP 2.5 (TIP) ×1 IMPLANT
ELECTRODE EZSTD 165MM 6.5IN (MISCELLANEOUS) ×1 IMPLANT
ELECTRODE REM PT RTRN 9FT ADLT (ELECTROSURGICAL) ×1 IMPLANT
GLOVE BIO SURGEON STRL SZ 6.5 (GLOVE) ×4 IMPLANT
GLOVE BIOGEL PI IND STRL 7.0 (GLOVE) ×2 IMPLANT
GLOVE BIOGEL PI INDICATOR 7.0 (GLOVE) ×2
GLOVE INDICATOR 7.0 STRL GRN (GLOVE) IMPLANT
GOWN STRL REUS W/ TWL LRG LVL3 (GOWN DISPOSABLE) ×2 IMPLANT
GOWN STRL REUS W/TWL LRG LVL3 (GOWN DISPOSABLE) ×2
HANDLE YANKAUER SUCT BULB TIP (MISCELLANEOUS) ×2 IMPLANT
KIT TURNOVER KIT A (KITS) ×2 IMPLANT
LABEL OR SOLS (LABEL) IMPLANT
LIGASURE IMPACT 36 18CM CVD LR (INSTRUMENTS) ×2 IMPLANT
NEEDLE HYPO 22GX1.5 SAFETY (NEEDLE) ×2 IMPLANT
NS IRRIG 1000ML POUR BTL (IV SOLUTION) IMPLANT
NS IRRIG 500ML POUR BTL (IV SOLUTION) ×2 IMPLANT
PACK BASIN MAJOR ARMC (MISCELLANEOUS) ×2 IMPLANT
PACK COLON CLEAN CLOSURE (MISCELLANEOUS) IMPLANT
RELOAD PROXIMATE 75MM BLUE (ENDOMECHANICALS) IMPLANT
SPONGE DRAIN TRACH 4X4 STRL 2S (GAUZE/BANDAGES/DRESSINGS) ×2 IMPLANT
SPONGE LAP 18X18 RF (DISPOSABLE) ×2 IMPLANT
STAPLER PROXIMATE 75MM BLUE (STAPLE) IMPLANT
STAPLER SKIN PROX 35W (STAPLE) ×2 IMPLANT
STRIP CLOSURE SKIN 1/2X4 (GAUZE/BANDAGES/DRESSINGS) ×2 IMPLANT
SUT ETHILON 3-0 FS-10 30 BLK (SUTURE) ×2
SUT PDS AB 1 TP1 96 (SUTURE) ×4 IMPLANT
SUT PROLENE 5 0 RB 1 DA (SUTURE) ×2 IMPLANT
SUT SILK 2 0 (SUTURE)
SUT SILK 2-0 18XBRD TIE 12 (SUTURE) IMPLANT
SUT SILK 3 0 (SUTURE) ×3
SUT SILK 3 0 SH 30 (SUTURE) ×22 IMPLANT
SUT SILK 3-0 18XBRD TIE 12 (SUTURE) ×3 IMPLANT
SUT VIC AB 3-0 SH 27 (SUTURE) ×3
SUT VIC AB 3-0 SH 27X BRD (SUTURE) ×3 IMPLANT
SUTURE EHLN 3-0 FS-10 30 BLK (SUTURE) ×1 IMPLANT
SYR 20CC LL (SYRINGE) IMPLANT
SYR 30ML LL (SYRINGE) IMPLANT
TRAY FOLEY MTR SLVR 16FR STAT (SET/KITS/TRAYS/PACK) ×2 IMPLANT
TUBING CONNECTING 10 (TUBING) ×2 IMPLANT

## 2019-05-01 NOTE — Progress Notes (Signed)
Pharmacy Antibiotic Note  Darren [name] is a 73 y.o. male admitted on 2019-05-25 with intra-abdominal infx and perforated viscous.  Pharmacy has been consulted for zosyn dosing.  Plan: Zosyn 3.375g IV q8h (4 hour infusion).  Metronidazole 500 mg IV q8h has be d/c'd due to duplicate therapy.  Height: 6' (182.9 cm) Weight: 176 lb (79.8 kg) IBW/kg (Calculated) : 77.6  Temp (24hrs), Avg:99.3 F (37.4 C), Min:98.6 F (37 C), Max:99.6 F (37.6 C)  Recent Labs  Lab 05/25/19 2156 04/27/2019 0040  WBC 2.2*  --   CREATININE  --  2.01*    Estimated Creatinine Clearance: 37.5 mL/min (A) (by C-G formula based on SCr of 2.01 mg/dL (H)).    Allergies  Allergen Reactions  . Penicillins Hives    Thank you for allowing pharmacy to be a part of this patient's care.  Thomasene Ripple, PharmD, BCPS Clinical Pharmacist 05/09/2019

## 2019-05-01 NOTE — Op Note (Signed)
Operative Note  Preoperative Diagnosis: Perforated viscus  Postoperative Diagnosis: Perforated ulcer near the pylorus (unable to discern definitively if distal stomach or proximal duodenum)  Operation: Exploratory laparotomy and primary repair of perforated ulcer with omental Cheree Ditto patch  Surgeon: Duanne Guess, MD  Assistant: None  Anesthesia: General endotracheal  Findings: There were approximately 2 L of ascites within the abdominal cavity.  The liver was nodular and cirrhotic.  There was no frank pus appreciated, nor any succus.  A frank perforation of what appeared to be the distal stomach, but may have been the proximal duodenum was identified.  It was primarily repaired in 2 layers and covered with an omental Cheree Ditto patch.  Indications: Patient is a 73 year old man who experienced the acute onset of sharp epigastric pain at about 5 PM on the day of his presentation.  His pain worsened and he presented to the emergency department for evaluation.  He was tachycardic and tachypneic.  He became progressively hypotensive during his ER stay.  A CT scan was concerning for perforated ulcer near the first portion of the duodenum.  Procedure In Detail: After informed consent was obtained, the patient was brought to the operating room and placed supine on the OR table.  Bony prominences were padded and bilateral sequential compression devices were placed on the lower extremities.  General endotracheal anesthesia was induced.  A Foley catheter was aseptically placed.  Additional monitoring lines and a nasogastric tube were placed by the anesthesia service.  The patient was then positioned appropriately for the operation.  He was sterilely prepped and draped in standard fashion.  A timeout was performed confirming his identity, the procedure being performed, his allergies, all necessary equipment was available, and that maintenance anesthetic was adequate.  Antibiotics had been started in the  emergency department and these were completed prior to incision.  An upper midline incision was made from just below the xiphoid process to just below the umbilicus.  This was carried down through the subcutaneum using electrocautery.  The fascia was incised in the midline.  The peritoneum was sharply entered.  There were no adhesions appreciated.  No frank pus was identified.  The abdomen was explored.  Approximately 2 L of ascites were suctioned out.  The liver was grossly nodular.  The Thompson retractor was placed to provide exposure.  As I began exploring the abdomen, I identified a site of frank perforation at the distal antrum of the stomach versus the proximal part of the duodenum.  I opened the lesser sac and Kocherized the duodenum.  At this point, I encountered fairly brisk bleeding from what appeared to be a branch of a mesenteric vein.  This was suture-ligated and controlled.  The edges of the ulcer were freshened and the tissue sent for biopsy.  I then primarily closed the hole in the viscus with full thickness bites of running 3-0 Vicryl.  The repair line was then inverted with 3-0 silk Lembert stitches.  A tongue of omentum was then brought up and tacked into place overlying the repair.  A 19 French Blake drain was placed adjacent to the repair and brought out through the right lateral abdominal wall.  It was sutured in place with nylon.  The abdomen was then copiously irrigated.  It was inspected and no additional bleeding was identified.  The fascia was reapproximated with running #1 looped PDS suture.  The subcutaneum was irrigated and the skin closed with staples.  Dressings were applied.  Due to the patient's  unstable condition, the decision was made to send him to the intensive care unit, still intubated.  The time of the operative completion, a transfusion of 1 unit of packed red blood cells was being started.  EBL: 750cc  IVF: 2700 cc of crystalloid, 750 cc of 5% albumin, 1 unit of  packed red blood cells started  Specimen(s): Gastric biopsies  Drain: 19 [place] Blake drain in the right upper quadrant  Complications: none immediately apparent.   Counts: all needles, instruments, and sponges were counted and reported to be correct in number at the end of the case.   I was present for and participated in the entire operation.  Duanne GuessJennifer Gerry Blanchfield  4:19 AM

## 2019-05-01 NOTE — Consult Note (Signed)
PULMONARY / CRITICAL CARE MEDICINE   Name: Darren [name] MRN: [id number] DOB: 20-May-1946    ADMISSION DATE:  05/19/2019 CONSULTATION DATE:  04/27/2019  REFERRING MD:  Dr. Celine Ahr  CHIEF COMPLAINT:  Abdominal Pain  BRIEF DISCUSSION: 73 year old male admitted with perforated viscus.  General surgery took patient emergently to the OR for exploratory laparotomy and repair of perforated ulcer (ulcer near the pylorus).  Found to have approximately 2 L of ascites within abdominal cavity, and the liver was nodular and cirrhotic.  He returns to ICU post procedure, remains intubated due to hemodynamic instability.  HISTORY OF PRESENT ILLNESS:   [name] is a 73 year old male with a past medical history notable for hypertension and MI who presented to [facility] ED on 05/05/2019 with complaints of abdominal pain.  The abdominal pain was abrupt in onset, beginning around 5 hours prior to presentation.  He described it as midepigastric, sharp, 10 out of 10. His COVID-19 PCR is NEGATIVE. CTA of the chest/abdomen/pelvis noted free air and thickening in the first and second portion of the duodenum, concerning for perforated duodenal ulcer.  General surgery was consulted who took him emergently to the OR for exploratory laparotomy and repair of the perforation.  The ulcer was found near the pylorus, and approximately 2 L of ascites within the abdominal cavity were noted, and the liver was noted to be nodular and cirrhotic.  He returns to ICU post procedure, remains intubated due to hemodynamic instability.  PCCM is consulted for vent management.  PAST MEDICAL HISTORY :  He  has a past medical history of Hypertension and MI (myocardial infarction) ([place]) (2013).  PAST SURGICAL HISTORY: He  has no past surgical history on file.  Allergies  Allergen Reactions  . Penicillins Hives    No current facility-administered medications on file prior to encounter.    Current Outpatient Medications on File Prior to  Encounter  Medication Sig  . aspirin EC 81 MG tablet Take 81 mg by mouth daily.  Marland Kitchen atorvastatin (LIPITOR) 80 MG tablet Take 80 mg by mouth every evening.  . Cholecalciferol (VITAMIN D-1000 MAX ST) 25 MCG (1000 UT) tablet Take 1,000 Units by mouth daily.  Marland Kitchen gabapentin (NEURONTIN) 300 MG capsule Take 600 mg by mouth 3 (three) times daily.  . metoprolol succinate (TOPROL-XL) 25 MG 24 hr tablet Take 25 mg by mouth daily.  Marland Kitchen pyridOXINE (B-6) 50 MG tablet Take 50 mg by mouth daily.  . ramipril (ALTACE) 5 MG capsule Take 5 mg by mouth daily.  Marland Kitchen thiamine (VITAMIN B-1) 100 MG tablet Take 100 mg by mouth daily.  . vitamin B-12 (CYANOCOBALAMIN) 500 MCG tablet Take 500 mcg by mouth daily.  . DULoxetine (CYMBALTA) 20 MG capsule Take 20 mg by mouth daily.    FAMILY HISTORY:  His has no family status information on file.    SOCIAL HISTORY: He      COVID-19 DISASTER DECLARATION:  FULL CONTACT PHYSICAL EXAMINATION WAS NOT POSSIBLE DUE TO TREATMENT OF COVID-19 AND  CONSERVATION OF PERSONAL PROTECTIVE EQUIPMENT, LIMITED EXAM FINDINGS INCLUDE-  Patient assessed or the symptoms described in the history of present illness.  In the context of the Global COVID-19 pandemic, which necessitated consideration that the patient might be at risk for infection with the SARS-CoV-2 virus that causes COVID-19, Institutional protocols and algorithms that pertain to the evaluation of patients at risk for COVID-19 are in a state of rapid change based on information released by regulatory bodies including the CDC and federal  and state organizations. These policies and algorithms were followed during the patient's care while in hospital.   REVIEW OF SYSTEMS:   Unable to obtain due to sedation and intubation  SUBJECTIVE:  Unable to obtain due to intubation and sedation  VITAL SIGNS: BP (!) 83/51 (BP Location: Right Arm)   Pulse (!) 112   Temp 99.6 F (37.6 C)   Resp 17   Ht 6' (1.829 m)   Wt 79.8 kg   SpO2  92%   BMI 23.87 kg/m   HEMODYNAMICS:    VENTILATOR SETTINGS:    INTAKE / OUTPUT: No intake/output data recorded.  PHYSICAL EXAMINATION: General: Acutely ill-appearing male, laying in bed, intubated and sedated, no acute distress Neuro: Sedated and paralyzed after arrival from OR HEENT: Atraumatic, normocephalic, neck supple, ET tube in place, pupils PERRLA 1 mm sluggish bilaterally Cardiovascular: RRR, S1-S2, no murmurs rubs or gallops Lungs: Coarse breath sounds to auscultation bilaterally, vent assisted, even, nonlabored, no accessory muscle use Abdomen: Soft, nondistended, no guarding or rebound tenderness, bowel sounds hypoactive, midline abdominal incision clean dry and intact, drain to right upper quadrant Musculoskeletal: Normal bulk and tone, no deformities, no edema Skin: Warm and dry, no obvious rashes lesions or ulcerations  LABS:  BMET Recent Labs  Lab 05/25/2019 0040  NA 122*  K 3.8  CL 89*  CO2 20*  BUN 20  CREATININE 2.01*  GLUCOSE 96    Electrolytes Recent Labs  Lab 04/28/2019 0040  CALCIUM 7.3*    CBC Recent Labs  Lab 05/13/2019 2156  WBC 2.2*  HGB 13.1  HCT 38.4*  PLT 154    Coag's No results for input(s): APTT, INR in the last 168 hours.  Sepsis Markers No results for input(s): LATICACIDVEN, PROCALCITON, O2SATVEN in the last 168 hours.  ABG Recent Labs  Lab 05/03/2019 0318  PHART 7.23*  PCO2ART 42  PO2ART 113*    Liver Enzymes Recent Labs  Lab 05/06/2019 0040  AST 136*  ALT 70*  ALKPHOS 143*  BILITOT 2.3*  ALBUMIN 2.2*    Cardiac Enzymes No results for input(s): TROPONINI, PROBNP in the last 168 hours.  Glucose Recent Labs  Lab 04/26/2019 0444  GLUCAP 77    Imaging Ct Angio Chest/abd/pel For Dissection W And/or Wo Contrast  Result Date: 05/12/2019 CLINICAL DATA:  Chest and abdominal pain. EXAM: CT ANGIOGRAPHY CHEST, ABDOMEN AND PELVIS TECHNIQUE: Multidetector CT imaging through the chest, abdomen and pelvis was  performed using the standard protocol during bolus administration of intravenous contrast. Multiplanar reconstructed images and MIPs were obtained and reviewed to evaluate the vascular anatomy. CONTRAST:  137m ISOVUE-370 IOPAMIDOL (ISOVUE-370) INJECTION 76% COMPARISON:  Chest CT 04/05/2018 FINDINGS: CTA CHEST FINDINGS Cardiovascular: Diffuse aortic atherosclerosis and coronary artery calcifications. No aneurysm or dissection. Heart is mildly enlarged. Calcification and wall thickening at the left ventricle apex compatible with prior infarct. Mediastinum/Nodes: No mediastinal, hilar, or axillary adenopathy. Trachea and esophagus are unremarkable. Thyroid unremarkable. Lungs/Pleura: Dependent opacities noted in both lower lungs/lung bases compatible with dependent atelectasis or scarring. No effusions. Musculoskeletal: Chest wall soft tissues are unremarkable. No acute bony abnormality. Review of the MIP images confirms the above findings. CTA ABDOMEN AND PELVIS FINDINGS VASCULAR Aorta: Atherosclerotic calcifications throughout the abdominal aorta. No aneurysm or dissection. Celiac: Patent without evidence of aneurysm, dissection, vasculitis or significant stenosis. SMA: Patent without evidence of aneurysm, dissection, vasculitis or significant stenosis. Renals: Calcified plaque at the origin of both renal arteries. Suspect mild stenosis at the origin of the left  renal artery. Right renal artery appears widely patent. Small accessory left renal artery noted. IMA: Patent. Inflow: Atherosclerotic calcifications throughout the iliac vessels. No aneurysm, dissection or significant stenosis. Veins: No obvious venous abnormality within the limitations of this arterial phase study. Review of the MIP images confirms the above findings. NON-VASCULAR Hepatobiliary: Small gallstones within the gallbladder. Liver appears shrunken with probable micro nodularity along the surface suggesting cirrhosis. Gallbladder is moderately  distended. No biliary ductal dilatation. Pancreas: No focal abnormality or ductal dilatation. Spleen: No focal abnormality.  Normal size. Adrenals/Urinary Tract: Left renal cysts. No suspicious renal or adrenal mass. No hydronephrosis. Urinary bladder unremarkable. Stomach/Bowel: Sigmoid diverticulosis. No active diverticulitis. There is abnormal wall thickening within the cecum and ascending colon concerning for colitis. There is indistinctness and wall thickness within the 1st and 2nd portions of the duodenum. This is concerning for duodenitis, likely peptic ulcer disease. No evidence of bowel obstruction. Lymphatic: No adenopathy Reproductive: No visible focal abnormality. Other: Moderate to large volume ascites in the abdomen and pelvis. There is free air anteriorly within the upper abdomen and in the porta hepatis. Musculoskeletal: No acute bony abnormality. Review of the MIP images confirms the above findings. IMPRESSION: Diffuse aortic atherosclerosis. No evidence of aneurysm or dissection. Diffuse coronary artery disease. Cardiomegaly. 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 Dependent atelectasis or scarring in the lower lobes. Free intraperitoneal air. There are 2 areas of abnormal bowel, within the 1st and 2nd portions of the duodenum where there is wall thickening and indistinctness concerning for peptic ulcer disease. I suspect this is the source of the perforation and free air. However, the cecum and ascending colon also appears thick walled concerning for colitis. Appearance of the liver is suspicious for cirrhosis. Recommend clinical correlation. Moderate to large volume ascites. Cholelithiasis. Sigmoid diverticulosis. Critical Value/emergent results were called by telephone at the time of interpretation on 05/19/2019 at 11:33 pm to Dr. Nance Pear , who verbally acknowledged these results. Electronically Signed   By: Rolm Baptise M.D.   On:  05/21/2019 23:36     STUDIES:  CTA Chest/Abdomen/Pelvis 05/06/2019>> Diffuse aortic atherosclerosis. No evidence of aneurysm or dissection.Diffuse coronary artery  disease.Cardiomegaly. Wall thinning and calcifications at the left ventricle apex compatible with old infarct.Dependent atelectasis or scarring in the lower lobes.Free intraperitoneal air. There are 2 areas of abnormal bowel, within the 1st and 2nd portions of the duodenum where there is wall thickening and indistinctness concerning for peptic ulcer disease. I suspect this is the source of the perforation and free air. However, the cecum and ascending colon also appears thick walled concerning for colitis.Appearance of the liver is suspicious for cirrhosis. Recommend clinical correlation. Moderate to large volume ascites. Cholelithiasis.Sigmoid diverticulosis.  CULTURES: COVID-19 05/02/2019>> NEGATIVE Blood 5/6 x2>>  ANTIBIOTICS: Ciprofloxacin 5/5 x1 dose Flagyl 5/5 x1 dose Meropenem 5/6>>  SIGNIFICANT EVENTS: 05/26/2019>> Presented to [facility] ED [facility]/08/2019>> Taken emergently to OR for Exploratory Lap and repair of perforated duodenal ulcer 05/26/2019>> Returns to ICU post procedure, remains intubated due to hemodynamic instability  LINES/TUBES: ETT 5/6>> Left Radial Arterial Line 5/6>>   ASSESSMENT / PLAN:  PULMONARY A: Remains intubated post procedure due to hemodynamic instability P:   Full vent support, PRVC: 8 cc/kg Wean FiO2 and PEEP as tolerated Follow intermittent ABG and chest x-ray as needed SBT when parameters met VAP bundle  CARDIOVASCULAR A:  Septic shock +/-hypovolemic shock P:  Cardiac monitoring Maintain MAP greater than 65 IV fluids Received  IV albumin in OR Neo-Synephrine as needed to maintain MAP goal Transfuse PRBCs as needed  RENAL A:   AKI Hyponatremia P:   Monitor I&O's / urinary output Follow BMP Ensure adequate renal perfusion Avoid nephrotoxic agents as able Replace electrolytes as  indicated IV fluids: Normal saline  GASTROINTESTINAL A:   Perforated ulcer Cirrhosis w/ ascites Elevated LFTs P:   N.p.o. NG tube to low intermittent suction General surgery following, appreciate input Status post exploratory lap and repair of perforated ulcer Protonix IV Placed on Meropenem  Trend LFTs  HEMATOLOGIC A:   Acute blood loss anemia P:  Monitor for S/Sx of bleeding Trend CBC SCD's for VTE Prophylaxis  Transfuse for Hgb <8   INFECTIOUS A:   Septic shock secondary to perforated duodenal ulcer P:   Monitor fever curve Trend WBCs and procalcitonin Follow cultures as above Continue meropenem  ENDOCRINE A:   No acute issues P:   CBGs Follow ICU hypo/hyperglycemia protocol  NEUROLOGIC A:   Sedation needs in setting of mechanical ventilation P:   RASS goal: -2 to -3 Propofol for sedation Avoid sedating meds as able Daily wake-up assessment Provide supportive care   FAMILY  - Updates: No family present at bedside 05/18/2019 for update during NP rounds.  - Inter-disciplinary family meet or Palliative Care meeting due by:  05/07/2019    Darel Hong, AGACNP-BC [facility] Pager: [phone] Cell: [phone]  05/13/2019, 5:12 AM

## 2019-05-01 NOTE — Anesthesia Procedure Notes (Signed)
Procedure Name: Intubation Date/Time: May 12, 2019 1:36 AM Performed by: Waldo Laine, CRNA Pre-anesthesia Checklist: Patient identified, Patient being monitored, Timeout performed, Emergency Drugs available and Suction available Patient Re-evaluated:Patient Re-evaluated prior to induction Oxygen Delivery Method: Circle system utilized Preoxygenation: Pre-oxygenation with 100% oxygen Induction Type: IV induction and Rapid sequence Laryngoscope Size: Miller and 2 Grade View: Grade I Tube type: Oral Tube size: 7.5 mm Number of attempts: 1 Airway Equipment and Method: Stylet Placement Confirmation: ETT inserted through vocal cords under direct vision,  positive ETCO2 and breath sounds checked- equal and bilateral Secured at: 21 cm Tube secured with: Tape Dental Injury: Teeth and Oropharynx as per pre-operative assessment

## 2019-05-01 NOTE — Anesthesia Procedure Notes (Signed)
Arterial Line Insertion Start/End05/17/2020 1:50 AM, 05/01/2019 2:04 AM Performed by: Jovita Gamma, MD, anesthesiologist  Patient location: Pre-op. Preanesthetic checklist: patient identified, IV checked, site marked, risks and benefits discussed, surgical consent, monitors and equipment checked, pre-op evaluation, timeout performed and anesthesia consent Patient sedated Left, radial was placed Catheter size: 20 Fr Hand hygiene performed  and Seldinger technique used  Attempts: 3 Procedure performed using ultrasound guided technique. Following insertion, dressing applied. Post procedure assessment: normal and unchanged  Post procedure complications: local hematoma.

## 2019-05-01 NOTE — Anesthesia Post-op Follow-up Note (Signed)
Anesthesia QCDR form completed.        

## 2019-05-01 NOTE — TOC Initial Note (Signed)
Transition of Care [facility]) - Initial/Assessment Note    Patient Details  Name: Darren [name] MRN: [id number] Date of Birth: 10-Jan-1946  [facility] CM/SW Contact:    Barrie Dunker, RN Phone Number: 05/05/2019, 12:24 PM  Clinical Narrative:                 Called wife Darren [name] at [phone] trying to complete assessment, unable to reach the wife left a VM for a return call        Patient Goals and CMS Choice        Expected Discharge Plan and Services                                                Prior Living Arrangements/Services                       Activities of Daily Living      Permission Sought/Granted                  Emotional Assessment              Admission diagnosis:  Free intraperitoneal air [K66.8] Abdominal pain, unspecified abdominal location [R10.9] Patient Active Problem List   Diagnosis Date Noted  . Perforated viscus 05/08/2019   PCP:  Darren Ivan, MD Pharmacy:   [facility] [phone] - [place], [place] - [address] [place] [address] Phone: [phone] Fax: [phone]     Social Determinants of Health (SDOH) Interventions    Readmission Risk Interventions No flowsheet data found.

## 2019-05-01 NOTE — Progress Notes (Addendum)
SURGICAL ICU PROGRESS NOTE  Hospital Day(s): 1.   Post op day(s): Day of Surgery.   Interval History: Patient seen and examined, remains intubated and sedated. Per patient's ICU RN, he has received 3L LR for low CVP, since which his BP has stabilized (SBP 100 - 110 mmHg) since Levophed added to phenylephrine, though has not since needed to be increased. 700 mL of thin light pink serosanguinous non-bilious fluid has drained from patient's RUQ surgical bulb suction drain. No other issues are reported.  Review of Systems:  Unable to complete assessment due to patient's overall condition and intubation  Vital signs in last 24 hours: [min-max] current  Temp:  [96.8 F (36 C)-99.6 F (37.6 C)] 96.8 F (36 C) (05/06 1230) Pulse Rate:  [83-143] 93 (05/06 1230) Resp:  [10-28] 13 (05/06 1230) BP: (80-131)/(51-84) 131/77 (05/06 1230) SpO2:  [89 %-97 %] 97 % (05/06 1230) Arterial Line BP: (76-134)/(34-63) 134/63 (05/06 1230) FiO2 (%):  [40 %-50 %] 40 % (05/06 1230) Weight:  [79.8 kg-81.2 kg] 81.2 kg (05/06 0600)     Height: 6' (182.9 cm) Weight: 81.2 kg BMI (Calculated): 24.27   Intake/Output this shift:  Total I/O In: 3303.8 [I.V.:3297.8; IV Piggyback:6] Out: 1170 [Urine:450; Drains:720]   Intake/Output last 2 shifts:  @IOLAST2SHIFTS @   Physical Exam:  General: spontaneously moving head and opening eyes more frequently than yesterday  HENT: normocephalic without obvious abnormality, eyes anicteric Neuro: does not reply to sound or exam  Lungs: intubated without use of accessory respiratory muscles  Cardiovascular: mild tachycardia with s1/s2 heard, rhythm appears to be sinus on monitor Abdomen: soft and non-distended, patient does not wince with exam or direct pressure applied, RUQ surgical bulb suction well-secured and nearly filled with thin light pink non-bilious serosanguinous fluid, no surrounding erythema  Labs: CBC Latest Ref Rng & Units 05/02/2019 May 17, 2019 08/29/2012  WBC 4.0 -  10.5 K/uL - 2.2(L) 9.6  Hemoglobin 13.0 - 17.0 g/dL 9.4(L) 07.6 11.8(L)  Hematocrit 39.0 - 52.0 % 28.1(L) 38.4(L) 34.6(L)  Platelets 150 - 400 K/uL - 154 200   CMP Latest Ref Rng & Units 05/25/2019 05/23/2019 10/03/2016  Glucose 70 - 99 mg/dL 151(I) 96 -  BUN 8 - 23 mg/dL 18 20 -  Creatinine 3.43 - 1.24 mg/dL 7.35(D) 8.97(O) 4.78  Sodium 135 - 145 mmol/L 128(L) 122(L) -  Potassium 3.5 - 5.1 mmol/L 3.8 3.8 -  Chloride 98 - 111 mmol/L 100 89(L) -  CO2 22 - 32 mmol/L 17(L) 20(L) -  Calcium 8.9 - 10.3 mg/dL 6.6(L) 7.3(L) -  Total Protein 6.5 - 8.1 g/dL - 4.7(L) -  Total Bilirubin 0.3 - 1.2 mg/dL - 2.3(H) -  Alkaline Phos 38 - 126 U/L - 143(H) -  AST 15 - 41 U/L - 136(H) -  ALT 0 - 44 U/L - 70(H) -   Assessment/Plan:  73 y.o. male intubated and sedated on norepinephrine and phenylephrine on Day of Surgery s/p laparotomy with repair and Cheree Ditto patch of perforated peri-pyloric gastroduodenal ulcer, complicated by alcoholic cirrhosis, HTN, and CAD s/p MI (2013).    - supportive ICU care   - wean vasopressors and assisted ventilation as able  - monitor bowel function and abdominal + neurological exams  - monitor/trend am WBC and BMP, correct electrolytes  - medical management of comorbidities  - DVT prophylaxis  All of the above recommendations were discussed with patient's RN and his wife to his family's expressed satisfaction. Patient also confirms that Dr. Lady Gary was able to  reach her and discussed with her patient's operative findings, surgery, and condition.  -- Scherrie GerlachJason E. Earlene Plateravis, MD, RPVI [place]: [facility] - Partnering for exceptional care. Office: [phone]

## 2019-05-01 NOTE — Anesthesia Preprocedure Evaluation (Addendum)
Anesthesia Evaluation  Patient identified by MRN, date of birth, ID band Patient awake    Reviewed: Allergy & Precautions, H&P , NPO status , Patient's Chart, lab work & pertinent test results  Airway Mallampati: III  TM Distance: >3 FB Neck ROM: full    Dental  (+) Missing   Pulmonary neg COPD, neg recent URI, Current Smoker,           Cardiovascular hypertension, (-) angina+ Past MI and + Cardiac Stents  negative cardio ROS       Neuro/Psych negative neurological ROS  negative psych ROS   GI/Hepatic negative GI ROS, (+) Cirrhosis     substance abuse  alcohol use,   Endo/Other  negative endocrine ROSneg diabetes  Renal/GU      Musculoskeletal   Abdominal   Peds  Hematology negative hematology ROS (+)   Anesthesia Other Findings Past Medical History: No date: Hypertension 2013: MI (myocardial infarction) (HCC)  BMI    Body Mass Index:  23.87 kg/m      Reproductive/Obstetrics negative OB ROS                            Anesthesia Physical Anesthesia Plan  ASA: III and emergent  Anesthesia Plan: General ETT   Post-op Pain Management:    Induction: Rapid sequence and Intravenous  PONV Risk Score and Plan: Ondansetron, Dexamethasone and Treatment may vary due to age or medical condition  Airway Management Planned:   Additional Equipment:   Intra-op Plan:   Post-operative Plan: Extubation in OR  Informed Consent: I have reviewed the patients History and Physical, chart, labs and discussed the procedure including the risks, benefits and alternatives for the proposed anesthesia with the patient or authorized representative who has indicated his/her understanding and acceptance.     Dental Advisory Given  Plan Discussed with: Anesthesiologist and CRNA  Anesthesia Plan Comments:       Anesthesia Quick Evaluation

## 2019-05-01 NOTE — Procedures (Signed)
Central Venous Catheter Placement:TRIPLE LUMEN Indication: Patient receiving vesicant or irritant drug.; Patient receiving intravenous therapy for longer than 5 days. Patient has limited or no vascular access.   Consent:emergent  Hand washing performed prior to starting the procedure.   Procedure:   An active timeout was performed and correct patient, name, & ID confirmed.   Patient was positioned correctly for central venous access.  Patient was prepped using strict sterile technique including chlorohexadine preps, sterile drape, sterile gown and sterile gloves.  Mask, head covering used.  The area was prepped, draped and anesthetized in the usual sterile manner. Patient onsedative infusion, on vent.  A triple lumen catheter was placed in LEFT Internal Jugular Vein There was good blood return, catheter caps were placed on lumens, catheter flushed easily, the line was secured and a sterile dressing and BIO-PATCH applied.   Ultrasound was used to visualize vasculature and guidance of needle.   Number of Attempts: 1 Complications:none Estimated Blood Loss: < 5 ml Chest Radiograph: no PTX, line in good position Operator: Georgetta Haber. Danice Goltz, M.D.  [facility] Critical [facility] Medicine

## 2019-05-01 NOTE — Progress Notes (Signed)
Attempted to place right IJ CVC using ultrasound.  Able to cannulate right IJ, however unable to thread guidewire without meeting resistance, therefore attempt was aborted.  Patient tolerated procedure well with no obvious complications.  Follow-up chest x-ray without pneumothorax.   [name], AGACNP-BC [facility] Pager: [phone] Cell: [phone]

## 2019-05-01 NOTE — Progress Notes (Signed)
*  PRELIMINARY RESULTS* Echocardiogram 2D Echocardiogram has been performed.  Cristela Blue 05-25-2019, 2:05 PM

## 2019-05-01 NOTE — Progress Notes (Signed)
eLink Physician-Brief Progress Note Patient Name: Darren [name] DOB: [birth year] MRN: [id number]   Date of Service  04/26/2019  HPI/Events of Note  Pt admitted to the ICU s/p exploratory laparotomy for perforated duodenal ulcer. He is hypotensive and hyponatremic.  eICU Interventions  Normal Saline 500 ml iv fluid bolus over one hour.New patient evaluation completed.        Thomasene Lot Ogan 05/24/2019, 5:03 AM

## 2019-05-01 NOTE — Anesthesia Postprocedure Evaluation (Signed)
Anesthesia Post Note  Patient: Darren [name]  Procedure(s) Performed: EXPLORATORY LAPAROTOMY with repair of perforated ulcer, gastric biopsy (N/A Abdomen)  Patient location during evaluation: ICU Anesthesia Type: General Level of consciousness: sedated Pain management: pain level controlled Vital Signs Assessment: post-procedure vital signs reviewed and stable Respiratory status: patient on ventilator - see flowsheet for VS and respiratory function stable Cardiovascular status: stable Postop Assessment: no apparent nausea or vomiting Anesthetic complications: no     Last Vitals:  Vitals:   05/23/2019 0800 05/02/2019 0807  BP: (!) 89/57   Pulse: (!) 105 (!) 104  Resp: 14 15  Temp:  36.4 C  SpO2: 91% 91%    Last Pain:  Vitals:   05/15/2019 0807  TempSrc: Oral  PainSc:                  [name]

## 2019-05-01 NOTE — Transfer of Care (Signed)
Immediate Anesthesia Transfer of Care Note  Patient: Darren [name]  Procedure(s) Performed: EXPLORATORY LAPAROTOMY with repair of perforated ulcer, gastric biopsy (N/A Abdomen)  Patient Location: PACU and ICU  Anesthesia Type:General  Level of Consciousness: Patient remains intubated per anesthesia plan  Airway & Oxygen Therapy: Patient placed on Ventilator (see vital sign flow sheet for setting)  Post-op Assessment: Report given to RN  Post vital signs: Reviewed  Last Vitals:  Vitals Value Taken Time  BP 87/51 04/26/2019  5:00 AM  Temp    Pulse 100 05/20/2019  5:07 AM  Resp 14 04/30/2019  5:07 AM  SpO2 96 % 05/10/2019  5:07 AM  Vitals shown include unvalidated device data.  Last Pain:  Vitals:   04/29/2019 2224  TempSrc:   PainSc: 10-Worst pain ever         Complications: No apparent anesthesia complications

## 2019-05-01 NOTE — Progress Notes (Signed)
Pharmacy Antibiotic Note  Darren [name] is a 73 y.o. male admitted on 05/19/2019 with abdominal pain. Patient with perforated viscous on CT and is s/p exploratory laparotomy and repair of perforation. Patient with significant history of alcohol abuse. Patient with penicillin allergy, with reaction of hives. Pharmacy has been consulted for fluconazole dosing.  Plan: Fluconazole 800mg  IV x 1 followed by fluconazole 400mg  IV Q24hr.   Continue meropenem 1g IV Q8hr.   Height: 6' (182.9 cm) Weight: 179 lb 0.2 oz (81.2 kg) IBW/kg (Calculated) : 77.6  Temp (24hrs), Avg:98.2 F (36.8 C), Min:96.8 F (36 C), Max:99.6 F (37.6 C)  Recent Labs  Lab 05/26/2019 2156 05/04/2019 0040 04/30/2019 1119 04/28/2019 1120 05/12/2019 1438  WBC 2.2*  --   --  13.1*  --   CREATININE  --  2.01*  --  1.41*  --   LATICACIDVEN  --   --  3.8*  --  3.1*    Estimated Creatinine Clearance: 53.5 mL/min (A) (by C-G formula based on SCr of 1.41 mg/dL (H)).    Allergies  Allergen Reactions  . Penicillins Hives    Antimicrobials this admission: Ciprofloxacin 5/5 x 1 Metronidazole 5/6 x 1 Meropenem 5/6 >>  Fluconazole 5/6 >>   Dose adjustments this admission: N/A  Microbiology results: 5/6 BCx: pending  5/6 MRSA PCR: pending  5/6 COVID19: negative   Thank you for allowing pharmacy to be a part of this patient's care.  Simpson,Michael L 05/19/2019 4:22 PM

## 2019-05-01 NOTE — Telephone Encounter (Signed)
Tried to reach patient's wife to update her on his status. LM on her VM for her to return my call.

## 2019-05-01 NOTE — Progress Notes (Signed)
Pharmacy Electrolyte Monitoring Consult:  Pharmacy consulted to assist in monitoring and replacing electrolytes in this 73 y.o. male admitted on 05/26/19 with Abdominal Pain   Labs:  Sodium (mmol/L)  Date Value  05/19/2019 128 (L)  08/28/2012 139   Potassium (mmol/L)  Date Value  04/27/2019 3.8  08/28/2012 3.6   Calcium (mg/dL)  Date Value  35/36/1443 6.6 (L)   Calcium, Total (mg/dL)  Date Value  15/40/0867 8.9   Albumin (g/dL)  Date Value  61/95/0932 2.2 (L)  07/12/2012 3.8   Corrected calcium: 8   Assessment/Plan: Patient with history of alcohol abuse. Started on CIWA and banana bag daily. Patient ordered LR/30mEq of Potassium at 15mL/hr.   No replacement warranted at this time.   Will check electrolytes with am labs.   Will replace for goal potassium ~ 4 and goal magnesium ~ 2.   Pharmacy will continue to monitor and adjust per consult.   Simpson,Michael L 05/25/2019 4:12 PM

## 2019-05-01 NOTE — Progress Notes (Signed)
Dr Earlene Plater notified of serosanguineous JP drain output in 4 hours.  No new orders received.

## 2019-05-01 NOTE — H&P (Signed)
Subjective:   Patient is a 73 y.o. male presents with abdominal pain. Onset of symptoms was abrupt starting 5 hours ago with unchanged course since that time. The pain is located in the mid epigastric area. Patient describes the pain as sharp continuous and rated as 10 / 10. Pain has been associated with ambulation.  He states that he was at the grocery store when it started.  He went home and laid down.  He put a heating pad on his abdomen but this did not resolve.  The pain gradually became worse to the point that he contacted EMS and was brought to the emergency department by ambulance.  In the emergency department, he was found to be tachycardic and tachypneic.  His blood pressure was normal.  Due to concern for an aortic dissection, he underwent a CT angiogram.  While he had vascular disease, there was no dissection appreciated.  There was free air and thickening in the area of the first and second portion of the duodenum, most consistent with perforated duodenal ulcer.  He does have a prior history of significant alcohol consumption.  He currently drinks about 3 glasses of wine per day.  Patient Active Problem List   Diagnosis Date Noted  . Perforated viscus 05/14/2019   Past Medical History:  Diagnosis Date  . Hypertension   . MI (myocardial infarction) (HCC) 2013      (Not in a hospital admission)  Allergies  Allergen Reactions  . Penicillins Hives    Social History   Tobacco Use  . Smoking status: Not on file  Substance Use Topics  . Alcohol use: Not on file    No family history on file.  Review of Systems Pertinent items are noted in HPI.  Objective:   Patient Vitals for the past 8 hrs:  BP Temp Temp src Pulse Resp SpO2 Height Weight  2019/05/18 0054 (!) 83/51 - - (!) 112 17 92 % - -  05/14/2019 2200 112/80 - - - (!) 28 - - -  05/22/2019 2156 127/84 99.6 F (37.6 C) - (!) 142 (!) 28 96 % - -  05/11/2019 2150 - - - - - - 6' (1.829 m) 79.8 kg  04/28/2019 2147 127/84 99.6 F  (37.6 C) Axillary (!) 143 - 95 % - -   No intake/output data recorded. No intake/output data recorded.    General appearance: alert, cooperative and mild distress Head: Normocephalic, without obvious abnormality, atraumatic Eyes: negative, conjunctivae/corneas clear.  Nose: Nares normal. Septum midline. Mucosa normal. No drainage or sinus tenderness., no discharge Throat: lips, mucosa, and tongue normal; teeth and gums normal and Not performed due to COVID-19. Lungs: Patient is short of breath Heart: Tachycardic, regular rhythm. Abdomen: Abdomen is soft, tender to palpation in the upper epigastric area.  He has no surgical scars on his belly. Skin: Skin color, texture, turgor normal. No rashes or lesions Neurologic: Grossly normal   Data Review CLINICAL DATA:  Chest and abdominal pain.  EXAM: CT ANGIOGRAPHY CHEST, ABDOMEN AND PELVIS  TECHNIQUE: Multidetector CT imaging through the chest, abdomen and pelvis was performed using the standard protocol during bolus administration of intravenous contrast. Multiplanar reconstructed images and MIPs were obtained and reviewed to evaluate the vascular anatomy.  CONTRAST:  ISOVUE-370 IOPAMIDOL (ISOVUE-370) INJECTION 76%  COMPARISON:  Chest CT 04/05/2018  FINDINGS: CTA CHEST FINDINGS  Cardiovascular: Diffuse aortic atherosclerosis and coronary artery calcifications. No aneurysm or dissection. Heart is mildly enlarged. Calcification and wall thickening at the left  ventricle apex compatible with prior infarct.  Mediastinum/Nodes: No mediastinal, hilar, or axillary adenopathy. Trachea and esophagus are unremarkable. Thyroid unremarkable.  Lungs/Pleura: Dependent opacities noted in both lower lungs/lung bases compatible with dependent atelectasis or scarring. No effusions.  Musculoskeletal: Chest wall soft tissues are unremarkable. No acute bony abnormality.  Review of the MIP images confirms the above  findings.  CTA ABDOMEN AND PELVIS FINDINGS  VASCULAR  Aorta: Atherosclerotic calcifications throughout the abdominal aorta. No aneurysm or dissection.  Celiac: Patent without evidence of aneurysm, dissection, vasculitis or significant stenosis.  SMA: Patent without evidence of aneurysm, dissection, vasculitis or significant stenosis.  Renals: Calcified plaque at the origin of both renal arteries. Suspect mild stenosis at the origin of the left renal artery. Right renal artery appears widely patent. Small accessory left renal artery noted.  IMA: Patent.  Inflow: Atherosclerotic calcifications throughout the iliac vessels. No aneurysm, dissection or significant stenosis.  Veins: No obvious venous abnormality within the limitations of this arterial phase study.  Review of the MIP images confirms the above findings.  NON-VASCULAR  Hepatobiliary: Small gallstones within the gallbladder. Liver appears shrunken with probable micro nodularity along the surface suggesting cirrhosis. Gallbladder is moderately distended. No biliary ductal dilatation.  Pancreas: No focal abnormality or ductal dilatation.  Spleen: No focal abnormality.  Normal size.  Adrenals/Urinary Tract: Left renal cysts. No suspicious renal or adrenal mass. No hydronephrosis. Urinary bladder unremarkable.  Stomach/Bowel: Sigmoid diverticulosis. No active diverticulitis. There is abnormal wall thickening within the cecum and ascending colon concerning for colitis. There is indistinctness and wall thickness within the 1st and 2nd portions of the duodenum. This is concerning for duodenitis, likely peptic ulcer disease. No evidence of bowel obstruction.  Lymphatic: No adenopathy  Reproductive: No visible focal abnormality.  Other: Moderate to large volume ascites in the abdomen and pelvis. There is free air anteriorly within the upper abdomen and in the porta  hepatis.  Musculoskeletal: No acute bony abnormality.  Review of the MIP images confirms the above findings.  IMPRESSION: Diffuse aortic atherosclerosis. No evidence of aneurysm or dissection.  Diffuse coronary artery disease.  Cardiomegaly. 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  Dependent atelectasis or scarring in the lower lobes.  Free intraperitoneal air. There are 2 areas of abnormal bowel, within the 1st and 2nd portions of the duodenum where there is wall thickening and indistinctness concerning for peptic ulcer disease. I suspect this is the source of the perforation and free air. However, the cecum and ascending colon also appears thick walled concerning for colitis.  Appearance of the liver is suspicious for cirrhosis. Recommend clinical correlation. Moderate to large volume ascites.  Cholelithiasis.  Sigmoid diverticulosis.  Critical Value/emergent results were called by telephone at the time of interpretation on May 26, 2019 at 11:33 pm to Dr. Phineas Semen , who verbally acknowledged these results.   Electronically Signed   By: Charlett Nose M.D.   On: May 26, 2019 23:36  I personally reviewed the CT scan images and agree with the findings of small amounts of free air in the upper abdomen, most prominently near the second portion of the duodenum and adjacent to the porta hepatis.   Results for JAHI, RISTER (MRN [id number]) as of 05/25/2019 01:03  Ref. Range May 26, 2019 21:56  WBC Latest Ref Range: 4.0 - 10.5 K/uL 2.2 (L)  RBC Latest Ref Range: 4.22 - 5.81 MIL/uL 3.83 (L)  Hemoglobin Latest Ref Range: 13.0 - 17.0 g/dL 78.2  HCT Latest  Ref Range: 39.0 - 52.0 % 38.4 (L)  MCV Latest Ref Range: 80.0 - 100.0 fL 100.3 (H)  MCH Latest Ref Range: 26.0 - 34.0 pg 34.2 (H)  MCHC Latest Ref Range: 30.0 - 36.0 g/dL 95.634.1  RDW Latest Ref Range: 11.5 - 15.5 % 14.2  Platelets Latest Ref Range: 150 - 400 K/uL  154  nRBC Latest Ref Range: 0.0 - 0.2 % 0.0   Comprehensive metabolic panel is pending.  Assessment:   Active Problems:   Perforated viscus   Plan:   This is a 73 year old man with a history of prior significant alcohol use.  He presented to the emergency department with a 5-hour history of severe acute abdominal pain.  CT imaging is consistent with perforated viscus, most likely perforated duodenal ulcer.  We will proceed to the operating room for exploratory laparotomy and planned repair of the perforation.  I discussed the risks of the operation with him.  These include but are not limited to: Bleeding, infection, negative exploration, need for bowel resection, risks of anesthesia, damage to surrounding structures, stroke, heart attack, death.  He requested that we wait to contact his wife until the morning.  We will proceed expeditiously to the operating room.

## 2019-05-02 ENCOUNTER — Inpatient Hospital Stay: Payer: Medicare HMO

## 2019-05-02 DIAGNOSIS — K659 Peritonitis, unspecified: Secondary | ICD-10-CM

## 2019-05-02 DIAGNOSIS — Z7189 Other specified counseling: Secondary | ICD-10-CM

## 2019-05-02 DIAGNOSIS — Z515 Encounter for palliative care: Secondary | ICD-10-CM

## 2019-05-02 DIAGNOSIS — J9601 Acute respiratory failure with hypoxia: Secondary | ICD-10-CM

## 2019-05-02 LAB — COMPREHENSIVE METABOLIC PANEL
ALT: 68 U/L — ABNORMAL HIGH (ref 0–44)
AST: 163 U/L — ABNORMAL HIGH (ref 15–41)
Albumin: 2.8 g/dL — ABNORMAL LOW (ref 3.5–5.0)
Alkaline Phosphatase: 82 U/L (ref 38–126)
Anion gap: 9 (ref 5–15)
BUN: 18 mg/dL (ref 8–23)
CO2: 18 mmol/L — ABNORMAL LOW (ref 22–32)
Calcium: 7 mg/dL — ABNORMAL LOW (ref 8.9–10.3)
Chloride: 104 mmol/L (ref 98–111)
Creatinine, Ser: 1.21 mg/dL (ref 0.61–1.24)
GFR calc Af Amer: >60 mL/min (ref >60)
GFR calc non Af Amer: >60 mL/min (ref >60)
Glucose, Bld: 113 mg/dL — ABNORMAL HIGH (ref 70–99)
Potassium: 4.7 mmol/L (ref 3.5–5.1)
Sodium: 131 mmol/L — ABNORMAL LOW (ref 135–145)
Total Bilirubin: 3.1 mg/dL — ABNORMAL HIGH (ref 0.3–1.2)
Total Protein: 4.7 g/dL — ABNORMAL LOW (ref 6.5–8.1)

## 2019-05-02 LAB — CBC
HCT: 24.4 % — ABNORMAL LOW (ref 39.0–52.0)
Hemoglobin: 8.2 g/dL — ABNORMAL LOW (ref 13.0–17.0)
MCH: 33.9 pg (ref 26.0–34.0)
MCHC: 33.6 g/dL (ref 30.0–36.0)
MCV: 100.8 fL — ABNORMAL HIGH (ref 80.0–100.0)
Platelets: 133 10*3/uL — ABNORMAL LOW (ref 150–400)
RBC: 2.42 MIL/uL — ABNORMAL LOW (ref 4.22–5.81)
RDW: 16.7 % — ABNORMAL HIGH (ref 11.5–15.5)
WBC: 17.1 10*3/uL — ABNORMAL HIGH (ref 4.0–10.5)
nRBC: 0 % (ref 0.0–0.2)

## 2019-05-02 LAB — SURGICAL PATHOLOGY

## 2019-05-02 LAB — BLOOD GAS, ARTERIAL
Acid-base deficit: 9.3 mmol/L — ABNORMAL HIGH (ref 0.0–2.0)
Bicarbonate: 17.6 mmol/L — ABNORMAL LOW (ref 20.0–28.0)
FIO2: 0.4
MECHVT: 450 mL
O2 Saturation: 96.7 %
PEEP: 5 cmH2O
Patient temperature: 37
RATE: 18 resp/min
pCO2 arterial: 41 mmHg (ref 32.0–48.0)
pH, Arterial: 7.24 — ABNORMAL LOW (ref 7.350–7.450)
pO2, Arterial: 102 mmHg (ref 83.0–108.0)

## 2019-05-02 LAB — HIV ANTIBODY (ROUTINE TESTING W REFLEX): HIV Screen 4th Generation wRfx: NONREACTIVE

## 2019-05-02 LAB — PROCALCITONIN: Procalcitonin: 25.92 ng/mL

## 2019-05-02 LAB — MRSA PCR SCREENING: MRSA by PCR: NEGATIVE

## 2019-05-02 LAB — LACTIC ACID, PLASMA: Lactic Acid, Venous: 1.3 mmol/L (ref 0.5–1.9)

## 2019-05-02 LAB — HEMOGLOBIN AND HEMATOCRIT, BLOOD
HCT: 25.2 % — ABNORMAL LOW (ref 39.0–52.0)
Hemoglobin: 8.6 g/dL — ABNORMAL LOW (ref 13.0–17.0)

## 2019-05-02 MED ORDER — DOBUTAMINE IN D5W 4-5 MG/ML-% IV SOLN
2.5000 ug/kg/min | INTRAVENOUS | Status: DC
  Administered 2019-05-02: 2.5 ug/kg/min via INTRAVENOUS
  Filled 2019-05-02: qty 250

## 2019-05-02 MED ORDER — NOREPINEPHRINE BITARTRATE 1 MG/ML IV SOLN
0.0000 ug/min | INTRAVENOUS | Status: DC
  Administered 2019-05-02: 40 ug/min via INTRAVENOUS
  Administered 2019-05-02: 10 ug/min via INTRAVENOUS
  Administered 2019-05-03: 30 ug/min via INTRAVENOUS
  Administered 2019-05-04: 16 ug/min via INTRAVENOUS
  Administered 2019-05-05: 20 ug/min via INTRAVENOUS
  Filled 2019-05-02 (×8): qty 16

## 2019-05-02 MED ORDER — LACTATED RINGERS IV BOLUS: 1000.0000 mL | Freq: Once | INTRAVENOUS | Status: DC

## 2019-05-02 MED ORDER — VASOPRESSIN 20 UNIT/ML IV SOLN
0.0300 [IU]/min | INTRAVENOUS | Status: DC
  Filled 2019-05-02: qty 2

## 2019-05-02 MED ORDER — LACTATED RINGERS IV SOLN
INTRAVENOUS | Status: DC
  Administered 2019-05-02 – 2019-05-03 (×3): via INTRAVENOUS

## 2019-05-02 MED ORDER — ALBUMIN HUMAN 25 % IV SOLN
12.5000 g | Freq: Once | INTRAVENOUS | Status: AC
  Administered 2019-05-02: 14:00:00 12.5 g via INTRAVENOUS
  Filled 2019-05-02 (×2): qty 50

## 2019-05-02 NOTE — Progress Notes (Signed)
JP drain putting 110 ml out at least every hour. Surgery notified when they were at the bedside. Will continue to monitor.

## 2019-05-02 NOTE — Progress Notes (Signed)
Follow up - Critical Care Medicine Note  Patient Details:    Darren [name] is an 73 y.o. male. 73 year old male admitted with perforated viscus.  Went emergently to the OR for exploratory laparotomy and repair of perforated ulcer (ulcer near the pylorus).  Found to have approximately 2 L of ascites within abdominal cavity, and the liver was nodular and cirrhotic.  He returns to ICU post procedure, remains intubated due to hemodynamic instability.   Lines, Airways, Drains: Airway 7.5 mm (Active)  Secured at (cm) 22 cm 05/02/2019 11:42 AM  Measured From Lips 05/02/2019 11:42 AM  Secured Location Left 05/02/2019 11:42 AM  Secured By [employer] 05/02/2019 11:42 AM  Tube Holder Repositioned Yes 05/02/2019 11:13 AM  Cuff Pressure (cm H2O) 27 cm H2O 05/02/2019  7:30 AM  Site Condition Dry 05/02/2019 11:13 AM     CVC Triple Lumen 04/29/2019 Left Internal jugular (Active)  Indication for Insertion or Continuance of Line Vasoactive infusions 05/02/2019  7:45 AM  Site Assessment Clean;Dry;Intact 05/02/2019  8:00 AM  Proximal Lumen Status Infusing 05/02/2019  8:00 AM  Medial Lumen Status Infusing;In-line blood sampling system in place 05/02/2019  8:00 AM  Distal Lumen Status Infusing;In-line blood sampling system in place 05/02/2019  8:00 AM  Dressing Type Transparent 05/02/2019  8:00 AM  Dressing Status Clean;Dry;Antimicrobial disc in place;Intact 05/02/2019  8:00 AM  Line Care Zeroed and calibrated;Line pulled back;Leveled;Connections checked and tightened 05/02/2019  8:00 AM  Dressing Intervention New dressing 05/08/2019  5:00 PM  Dressing Change Due 05/08/19 05/04/2019  8:00 PM     Arterial Line 05/23/2019 Radial (Active)  Site Assessment Clean;Dry;Intact 05/02/2019  8:00 AM  Line Status Pulsatile blood flow 05/02/2019  8:00 AM  Art Line Waveform Dampened 05/02/2019  8:00 AM  Art Line Interventions Zeroed and calibrated 05/02/2019  8:00 AM  Color/Movement/Sensation Capillary refill less than 3 sec;Cool fingers/toes  05/02/2019  8:00 AM  Dressing Type Transparent 05/02/2019  8:00 AM  Dressing Status Clean;Dry;Intact 05/02/2019  8:00 AM  Interventions Dressing changed 05/16/2019  8:00 PM  Dressing Change Due 05/08/19 05/25/2019  8:00 PM     Closed System Drain Superior;Medial Abdomen Bulb (JP) 19 Fr. (Active)  Site Description Unable to view 05/02/2019  3:50 AM  Dressing Status Clean;Dry;Intact 05/02/2019 11:42 AM  Drainage Appearance Serosanguineous 05/02/2019 11:42 AM  Status To suction (Charged) 05/02/2019 11:42 AM  Output (mL) 110 mL 05/02/2019 12:53 PM     NG/OG Tube Nasogastric 18 Fr. Right nare Documented cm marking at nare/ corner of mouth 65 cm (Active)  Cm Marking at Nare/Corner of Mouth (if applicable) 65 cm 02/25/8249  3:50 AM  Site Assessment Clean;Dry;Intact 05/02/2019 11:42 AM  Ongoing Placement Verification No change in cm markings or external length of tube from initial placement;No change in respiratory status;No acute changes, not attributed to clinical condition;Xray 05/02/2019 11:42 AM  Status Suction-low intermittent 05/02/2019 11:42 AM  Drainage Appearance Brown 05/02/2019  3:50 AM  Output (mL) 50 mL 05/02/2019  3:00 AM     Urethral Catheter Dortha Kern, RN Latex 16 Fr. (Active)  Indication for Insertion or Continuance of Catheter Unstable critically ill patients first 24-48 hours (See Criteria) 05/02/2019 11:42 AM  Site Assessment Clean;Intact;Dry 05/02/2019 11:42 AM  Catheter Maintenance Bag below level of bladder;Catheter secured;Drainage bag/tubing not touching floor;Insertion date on drainage bag;No dependent loops;Seal intact;Bag emptied prior to transport 05/02/2019 11:42 AM  Collection Container Standard drainage bag 05/02/2019 11:42 AM  Securement Method Securing device (Describe) 05/02/2019 11:42 AM  Urinary Catheter Interventions Unclamped 05/02/2019 11:42 AM  Output (mL) 65 mL 05/02/2019  5:09 AM    Anti-infectives:  Anti-infectives (From admission, onward)   Start     Dose/Rate Route Frequency Ordered  Stop   05/02/19 1000  fluconazole (DIFLUCAN) IVPB 400 mg     400 mg 100 mL/hr over 120 Minutes Intravenous Daily 05/12/2019 1249     04/29/2019 1400  meropenem (MERREM) 1 g in sodium chloride 0.9 % 100 mL IVPB     1 g 200 mL/hr over 30 Minutes Intravenous Every 8 hours 05/02/2019 1251     05/24/2019 1300  fluconazole (DIFLUCAN) IVPB 800 mg     800 mg 200 mL/hr over 120 Minutes Intravenous  Once 05/11/2019 1249 05/05/2019 1656   05/22/2019 0530  meropenem (MERREM) 1 g in sodium chloride 0.9 % 100 mL IVPB  Status:  Discontinued     1 g 200 mL/hr over 30 Minutes Intravenous Every 12 hours 05/05/2019 0518 05/13/2019 1251   05/25/2019 0515  metroNIDAZOLE (FLAGYL) IVPB 500 mg  Status:  Discontinued     500 mg 100 mL/hr over 60 Minutes Intravenous Every 8 hours 05/06/2019 0507 05/20/2019 0509   04/28/2019 0515  piperacillin-tazobactam (ZOSYN) IVPB 3.375 g  Status:  Discontinued     3.375 g 12.5 mL/hr over 240 Minutes Intravenous Every 8 hours 04/26/2019 0508 05/09/2019 0512   05/23/2019 2345  ciprofloxacin (CIPRO) IVPB 400 mg     400 mg 200 mL/hr over 60 Minutes Intravenous  Once 05/05/2019 2335 04/29/2019 0058   05/18/2019 2345  metroNIDAZOLE (FLAGYL) IVPB 500 mg  Status:  Discontinued     500 mg 100 mL/hr over 60 Minutes Intravenous  Once 05/12/2019 2335 05/08/2019 1856      Microbiology: Results for orders placed or performed during the hospital encounter of 05/09/2019  SARS Coronavirus 2 (CEPHEID - Performed in [place] hospital lab), Hosp Order     Status: None   Collection Time: 04/26/2019  9:56 PM  Result Value Ref Range Status   SARS Coronavirus 2 NEGATIVE NEGATIVE Final    Comment: (NOTE) If result is NEGATIVE SARS-CoV-2 target nucleic acids are NOT DETECTED. The SARS-CoV-2 RNA is generally detectable in upper and lower  respiratory specimens during the acute phase of infection. The lowest  concentration of SARS-CoV-2 viral copies this assay can detect is 250  copies / mL. A negative result does not preclude  SARS-CoV-2 infection  and should not be used as the sole basis for treatment or other  patient management decisions.  A negative result may occur with  improper specimen collection / handling, submission of specimen other  than nasopharyngeal swab, presence of viral mutation(s) within the  areas targeted by this assay, and inadequate number of viral copies  (<250 copies / mL). A negative result must be combined with clinical  observations, patient history, and epidemiological information. If result is POSITIVE SARS-CoV-2 target nucleic acids are DETECTED. The SARS-CoV-2 RNA is generally detectable in upper and lower  respiratory specimens dur ing the acute phase of infection.  Positive  results are indicative of active infection with SARS-CoV-2.  Clinical  correlation with patient history and other diagnostic information is  necessary to determine patient infection status.  Positive results do  not rule out bacterial infection or co-infection with other viruses. If result is PRESUMPTIVE POSTIVE SARS-CoV-2 nucleic acids MAY BE PRESENT.   A presumptive positive result was obtained on the submitted specimen  and confirmed on repeat testing.  While  2019 novel coronavirus  (SARS-CoV-2) nucleic acids may be present in the submitted sample  additional confirmatory testing may be necessary for epidemiological  and / or clinical management purposes  to differentiate between  SARS-CoV-2 and other Sarbecovirus currently known to infect humans.  If clinically indicated additional testing with an alternate test  methodology [phone]) is advised. The SARS-CoV-2 RNA is generally  detectable in upper and lower respiratory sp ecimens during the acute  phase of infection. The expected result is Negative. Fact Sheet for Patients:  StrictlyIdeas.no Fact Sheet for Healthcare Providers: BankingDealers.co.za This test is not yet approved or cleared by the  [place] FDA and has been authorized for detection and/or diagnosis of SARS-CoV-2 by FDA under an Emergency Use Authorization (EUA).  This EUA will remain in effect (meaning this test can be used) for the duration of the COVID-19 declaration under Section 564(b)(1) of the Act, 21 U.S.C. section 360bbb-3(b)(1), unless the authorization is terminated or revoked sooner. Performed at [facility], [address] [place], [place] [address]   CULTURE, BLOOD (ROUTINE X 2) w Reflex to ID Panel     Status: None (Preliminary result)   Collection Time: 05/04/2019  6:39 AM  Result Value Ref Range Status   Specimen Description BLOOD LEFT [facility]  Final   Special Requests   Final    BOTTLES DRAWN AEROBIC AND ANAEROBIC Blood Culture adequate volume   Culture   Final    NO GROWTH 1 DAY Performed at [facility], [address] [place], [place] [address]    Report Status PENDING  Incomplete  CULTURE, BLOOD (ROUTINE X 2) w Reflex to ID Panel     Status: None (Preliminary result)   Collection Time: 04/28/2019  7:36 AM  Result Value Ref Range Status   Specimen Description BLOOD LEFT AC  Final   Special Requests   Final    BOTTLES DRAWN AEROBIC AND ANAEROBIC Blood Culture results may not be optimal due to an excessive volume of blood received in culture bottles   Culture   Final    NO GROWTH < 24 HOURS Performed at [facility], [address], [place], [place] [address]    Report Status PENDING  Incomplete    Best Practice/Protocols:  VTE Prophylaxis: Mechanical GI Prophylaxis: Proton Pump Inhibitor Continous Sedation  Events:   Studies: Dg Abd 1 View  Result Date: 05/26/2019 CLINICAL DATA:  Nasogastric tube EXAM: ABDOMEN - 1 VIEW COMPARISON:  Chest CT earlier today FINDINGS: Surgical drain is in place over the epigastrium. There is an enteric tube with tip at the mid stomach. Contrast is still being excreted from the kidneys related to prior CT. Cholelithiasis.  IMPRESSION: Orogastric tube in good position. Electronically Signed   By: Monte Fantasia M.D.   On: 05/14/2019 05:38   Dg Chest Port 1 View  Result Date: 05/02/2019 CLINICAL DATA:  Acute respiratory failure EXAM: PORTABLE CHEST 1 VIEW COMPARISON:  05/07/2019 FINDINGS: Support devices are stable. Heart is borderline in size. Improving aeration in the lung bases with decreasing bilateral airspace disease and probable effusions. No acute bony abnormality. IMPRESSION: Improving aeration in the lung bases with decreasing bibasilar opacities and effusions. Electronically Signed   By: Rolm Baptise M.D.   On: 05/02/2019 02:02   Dg Chest Port 1 View  Result Date: 05/14/2019 CLINICAL DATA:  Status post central line placement today. EXAM: PORTABLE CHEST 1 VIEW COMPARISON:  Single-view of the chest earlier today. FINDINGS: New left IJ approach  central venous catheter is in place with the tip in the mid to lower superior vena cava. ETT and NG tube again seen. No pneumothorax. Bibasilar airspace disease has worsened. Heart size is upper normal. No acute bony abnormality. IMPRESSION: Left IJ catheter tip projects in the mid to lower superior vena cava. No pneumothorax. Increased bibasilar airspace disease. Electronically Signed   By: Inge Rise M.D.   On: 04/29/2019 10:51   Dg Chest Port 1 View  Result Date: 05/21/2019 CLINICAL DATA:  Attempted right internal jugular CVC placement but unable to thread guidewire. Evaluate for complication. EXAM: PORTABLE CHEST 1 VIEW COMPARISON:  Chest x-ray from same day at 4:56 a.m. FINDINGS: Unchanged endotracheal and enteric tubes. Stable cardiomediastinal silhouette. Peripheral interstitial coarsening is unchanged. Unchanged bibasilar atelectasis. No focal consolidation, pleural effusion, or pneumothorax. No acute osseous abnormality. IMPRESSION: 1. No pneumothorax. 2. Unchanged bibasilar atelectasis. Electronically Signed   By: Titus Dubin M.D.   On: 05/06/2019 07:24    Portable Chest X-ray  Result Date: 05/04/2019 CLINICAL DATA:  Intubation EXAM: PORTABLE CHEST 1 VIEW COMPARISON:  Chest CT from yesterday FINDINGS: Endotracheal tube with tip at the clavicular heads. The orogastric tube at least reaches the stomach. Interstitial coarsening attributed atelectasis based on prior CT and low lung volumes. There may also be developing edema. There is coronary calcification and a partially calcified left ventricular apex from remote infarct. IMPRESSION: 1. Unremarkable hardware positioning. 2. Low lung volumes with atelectasis. There may be developing edema. Electronically Signed   By: Monte Fantasia M.D.   On: 05/14/2019 05:37   Ct Angio Chest/abd/pel For Dissection W And/or Wo Contrast  Result Date: 05/06/2019 CLINICAL DATA:  Chest and abdominal pain. EXAM: CT ANGIOGRAPHY CHEST, ABDOMEN AND PELVIS TECHNIQUE: Multidetector CT imaging through the chest, abdomen and pelvis was performed using the standard protocol during bolus administration of intravenous contrast. Multiplanar reconstructed images and MIPs were obtained and reviewed to evaluate the vascular anatomy. CONTRAST:  159m ISOVUE-370 IOPAMIDOL (ISOVUE-370) INJECTION 76% COMPARISON:  Chest CT 04/05/2018 FINDINGS: CTA CHEST FINDINGS Cardiovascular: Diffuse aortic atherosclerosis and coronary artery calcifications. No aneurysm or dissection. Heart is mildly enlarged. Calcification and wall thickening at the left ventricle apex compatible with prior infarct. Mediastinum/Nodes: No mediastinal, hilar, or axillary adenopathy. Trachea and esophagus are unremarkable. Thyroid unremarkable. Lungs/Pleura: Dependent opacities noted in both lower lungs/lung bases compatible with dependent atelectasis or scarring. No effusions. Musculoskeletal: Chest wall soft tissues are unremarkable. No acute bony abnormality. Review of the MIP images confirms the above findings. CTA ABDOMEN AND PELVIS FINDINGS VASCULAR Aorta: Atherosclerotic  calcifications throughout the abdominal aorta. No aneurysm or dissection. Celiac: Patent without evidence of aneurysm, dissection, vasculitis or significant stenosis. SMA: Patent without evidence of aneurysm, dissection, vasculitis or significant stenosis. Renals: Calcified plaque at the origin of both renal arteries. Suspect mild stenosis at the origin of the left renal artery. Right renal artery appears widely patent. Small accessory left renal artery noted. IMA: Patent. Inflow: Atherosclerotic calcifications throughout the iliac vessels. No aneurysm, dissection or significant stenosis. Veins: No obvious venous abnormality within the limitations of this arterial phase study. Review of the MIP images confirms the above findings. NON-VASCULAR Hepatobiliary: Small gallstones within the gallbladder. Liver appears shrunken with probable micro nodularity along the surface suggesting cirrhosis. Gallbladder is moderately distended. No biliary ductal dilatation. Pancreas: No focal abnormality or ductal dilatation. Spleen: No focal abnormality.  Normal size. Adrenals/Urinary Tract: Left renal cysts. No suspicious renal or adrenal mass. No hydronephrosis. Urinary bladder unremarkable. Stomach/Bowel:  Sigmoid diverticulosis. No active diverticulitis. There is abnormal wall thickening within the cecum and ascending colon concerning for colitis. There is indistinctness and wall thickness within the 1st and 2nd portions of the duodenum. This is concerning for duodenitis, likely peptic ulcer disease. No evidence of bowel obstruction. Lymphatic: No adenopathy Reproductive: No visible focal abnormality. Other: Moderate to large volume ascites in the abdomen and pelvis. There is free air anteriorly within the upper abdomen and in the porta hepatis. Musculoskeletal: No acute bony abnormality. Review of the MIP images confirms the above findings. IMPRESSION: Diffuse aortic atherosclerosis. No evidence of aneurysm or dissection. Diffuse  coronary artery disease. Cardiomegaly. 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 Dependent atelectasis or scarring in the lower lobes. Free intraperitoneal air. There are 2 areas of abnormal bowel, within the 1st and 2nd portions of the duodenum where there is wall thickening and indistinctness concerning for peptic ulcer disease. I suspect this is the source of the perforation and free air. However, the cecum and ascending colon also appears thick walled concerning for colitis. Appearance of the liver is suspicious for cirrhosis. Recommend clinical correlation. Moderate to large volume ascites. Cholelithiasis. Sigmoid diverticulosis. Critical Value/emergent results were called by telephone at the time of interpretation on 04/28/2019 at 11:33 pm to Dr. Nance Pear , who verbally acknowledged these results. Electronically Signed   By: Rolm Baptise M.D.   On: 04/29/2019 23:36   2D Echo 5/6: 1. The left ventricle has moderate-severely reduced systolic function, with an ejection fraction of 30-35%. The cavity size was normal.Severe hypokinesis of the apical and periapical regions, basal wall motion best preserved.  2. Left ventricular diastolic Doppler parameters are consistent with impaired relaxation.  3. The right ventricle has normal systolic function. The cavity was normal. There is no increase in right ventricular wall thickness.Unable to estimate RVSP  4. The aortic valve was not well visualized. Aortic valve regurgitation is trivial by color flow DopplerSelect 4 chamber apical images concerning of apical thrombus - Dr. Rockey Situ   Consults: Treatment Team:  Fredirick Maudlin, MD   Subjective:    Overnight Issues: Requiring significant amount of sedation due to agitation when this is lightened.  He becomes asynchronous with the ventilator if this happens.  Has increased drainage from the JP drain due to ascites.  Continues to be pressor dependent.  2D  echo has revealed ejection fraction between 30 to 35%.  Has history of heavy alcohol use.  Objective:  Vital signs for last 24 hours: Temp:  [97.2 F (36.2 C)-97.5 F (36.4 C)] 97.5 F (36.4 C) (05/07 0801) Pulse Rate:  [73-128] 122 (05/07 1300) Resp:  [13-21] 17 (05/07 1300) BP: (91-130)/(38-72) 107/57 (05/07 1300) SpO2:  [89 %-99 %] 97 % (05/07 1113) Arterial Line BP: (68-133)/(32-60) 107/43 (05/07 0630) FiO2 (%):  [40 %] 40 % (05/07 1113)  Hemodynamic parameters for last 24 hours: CVP:  [0 mmHg-12 mmHg] 9 mmHg  Intake/Output from previous day: 05/06 0701 - 05/07 0700 In: 8139.6 [I.V.:7289.5; IV Piggyback:850.2] Out: 4742 [Urine:1055; Emesis/NG output:200; Drains:3655]  Intake/Output this shift: Total I/O In: -  Out: 540 [Drains:540]  Vent settings for last 24 hours: Vent Mode: Other (Comment) FiO2 (%):  [40 %] 40 % Set Rate:  [18 bmp] 18 bmp Vt Set:  [450 mL] 450 mL PEEP:  [5 cmH20-8 cmH20] 5 cmH20 Plateau Pressure:  [17 cmH20] 17 cmH20  Physical Exam:  General:  Critically ill-appearing male,intubated and sedated, synchronous with the  ventilator Neuro: Sedated, agitated when sedation lightened HEENT: ET tube orotracheally placed,PERRL Cardiovascular:  Sinus tachycardia, S1-S2, no murmurs rubs or gallops Lungs: Coarse breath sounds to auscultation bilaterally,  synchronous with the ventilator Abdomen: Soft, slightly distended, hypoactive to absent bowel sounds, midline abdominal incision clean dry and intact, JP drain to right upper quadrant with copious serosanguineous drainage Musculoskeletal: Normal bulk and tone, no deformities, no edema Skin: Warm and dry, no obvious rashes lesions or ulcerations Capillary refill: 2 Seconds Assessment/Plan:   Acute hypoxic respiratory failure Persistent need for mechanical ventilation due to hemodynamic instability  Full vent support, PRVC: 8 cc/kg Wean FiO2 and PEEP as tolerated Follow intermittent ABG and chest x-ray as  needed SBT when parameters met VAP bundle  Perforated viscus with peritonitis Septic shock + hypovolemic shock Cardiac monitoring Maintain MAP greater than 65 IV fluids Norepinephrine infusion  Norepinephrine is at max, add dobutamine given decrease EF transfuse PRBCs as needed  AKI -renal function improved with volume resuscitation Hyponatremia -improving with volume resuscitation Monitor I&O's / urinary output Follow BMP Ensure adequate renal perfusion Avoid nephrotoxic agents as able Replace electrolytes as indicated IV fluids: LR   Perforated ulcer Cirrhosis w/ ascites Elevated LFTs   NPO until cleared from surgical standpoint NG tube to low intermittent suction Status post exploratory lap and repair of perforated ulcer Protonix IV Placed on Meropenem/Fluconazole Trend LFTs Cirrhosis with ascites will add complexity to his management  Acute blood loss anemia Monitor for S/Sx of bleeding Trend CBC SCD's for VTE Prophylaxis, consider low-dose subcu heparin if okay with surgery Transfuse for Hgb <8   Septic shock secondary to perforated duodenal ulcer with peritonitis  Monitor fever curve Trend WBCs and procalcitonin Follow cultures as above Continue meropenem/fluconazole  Glycemic control No acute issues CBGs Follow ICU hypo/hyperglycemia protocol  Sedation needs in setting of mechanical ventilation Monitor for potential ETOH withdrawal  RASS goal: -2  Precedex/Fentanyl for sedation Daily wake-up assessment Provide supportive care CIWA "Banana bag" daily (thiamine, folate, magnesium plus other electrolytes)   LOS: 2 days   Additional comments: Multidisciplinary rounds were performed with ICU team.  Critical Care Total Time*: 45 Minutes  C.Derrill Kay, MD [facility]  05/02/2019  *Care during the described time interval was provided by me and/or other providers on the critical care team.  I have reviewed this patient's available  data, including medical history, events of note, physical examination and test results as part of my evaluation.

## 2019-05-02 NOTE — Progress Notes (Signed)
CC: S/p perf ulcer POD # 1 Subjective:  Septic now w improving hemodynamics, off neo drip still on levophed. Good UO Drain over 2lts of clear ascites. Persistent acidosis and hb 8 Echo : The left ventricle has moderate-severely reduced systolic function, with an ejection fraction of 30-35%, apical hypokinesia  Objective: Vital signs in last 24 hours: Temp:  [97.2 F (36.2 C)-97.5 F (36.4 C)] 97.5 F (36.4 C) (05/07 0801) Pulse Rate:  [73-128] 122 (05/07 1300) Resp:  [13-21] 17 (05/07 1300) BP: (91-130)/(38-72) 107/57 (05/07 1300) SpO2:  [91 %-99 %] 97 % (05/07 1113) Arterial Line BP: (68-133)/(32-60) 107/43 (05/07 0630) FiO2 (%):  [40 %] 40 % (05/07 1113) Last BM Date: 04/27/2019  Intake/Output from previous day: 05/06 0701 - 05/07 0700 In: 8139.6 [I.V.:7289.5; IV Piggyback:850.2] Out: 4910 [Urine:1055; Emesis/NG output:200; Drains:3655] Intake/Output this shift: Total I/O In: -  Out: 760 [Drains:760]  Physical exam: Sedated in NAD Abd: soft, I remove the dressing to make sure there was no bleeding given drop in bg. Staples intact, no evidence of infection. JP serous draiange. Ext: well perfused and no edema  Lab Results: CBC  Recent Labs    02/01/19 1120 05/02/19 0037 05/02/19 0322  WBC 13.1*  --  17.1*  HGB 8.2* 8.6* 8.2*  HCT 23.9* 25.2* 24.4*  PLT 115*  --  133*   BMET Recent Labs    02/01/19 1120 05/02/19 0322  NA 128* 131*  K 3.8 4.7  CL 100 104  CO2 17* 18*  GLUCOSE 106* 113*  BUN 18 18  CREATININE 1.41* 1.21  CALCIUM 6.6* 7.0*   PT/INR No results for input(s): LABPROT, INR in the last 72 hours. ABG Recent Labs    02/01/19 1010 05/02/19 0322  PHART 7.23* 7.24*  HCO3 16.3* 17.6*    Studies/Results: Dg Abd 1 View  Result Date: 05/12/2019 CLINICAL DATA:  Nasogastric tube EXAM: ABDOMEN - 1 VIEW COMPARISON:  Chest CT earlier today FINDINGS: Surgical drain is in place over the epigastrium. There is an enteric tube with tip at the mid stomach.  Contrast is still being excreted from the kidneys related to prior CT. Cholelithiasis. IMPRESSION: Orogastric tube in good position. Electronically Signed   By: Marnee SpringJonathon  Watts M.D.   On: 02/01/2019 05:38   Dg Chest Port 1 View  Result Date: 05/02/2019 CLINICAL DATA:  Acute respiratory failure EXAM: PORTABLE CHEST 1 VIEW COMPARISON:  02/01/2019 FINDINGS: Support devices are stable. Heart is borderline in size. Improving aeration in the lung bases with decreasing bilateral airspace disease and probable effusions. No acute bony abnormality. IMPRESSION: Improving aeration in the lung bases with decreasing bibasilar opacities and effusions. Electronically Signed   By: Charlett NoseKevin  Dover M.D.   On: 05/02/2019 02:02   Dg Chest Port 1 View  Result Date: 05/20/2019 CLINICAL DATA:  Status post central line placement today. EXAM: PORTABLE CHEST 1 VIEW COMPARISON:  Single-view of the chest earlier today. FINDINGS: New left IJ approach central venous catheter is in place with the tip in the mid to lower superior vena cava. ETT and NG tube again seen. No pneumothorax. Bibasilar airspace disease has worsened. Heart size is upper normal. No acute bony abnormality. IMPRESSION: Left IJ catheter tip projects in the mid to lower superior vena cava. No pneumothorax. Increased bibasilar airspace disease. Electronically Signed   By: Drusilla Kannerhomas  Dalessio M.D.   On: 02/01/2019 10:51   Dg Chest Port 1 View  Result Date: 05/15/2019 CLINICAL DATA:  Attempted right internal jugular CVC placement  but unable to thread guidewire. Evaluate for complication. EXAM: PORTABLE CHEST 1 VIEW COMPARISON:  Chest x-ray from same day at 4:56 a.m. FINDINGS: Unchanged endotracheal and enteric tubes. Stable cardiomediastinal silhouette. Peripheral interstitial coarsening is unchanged. Unchanged bibasilar atelectasis. No focal consolidation, pleural effusion, or pneumothorax. No acute osseous abnormality. IMPRESSION: 1. No pneumothorax. 2. Unchanged bibasilar  atelectasis. Electronically Signed   By: Obie Dredge M.D.   On: May 14, 2019 07:24   Portable Chest X-ray  Result Date: 2019/05/14 CLINICAL DATA:  Intubation EXAM: PORTABLE CHEST 1 VIEW COMPARISON:  Chest CT from yesterday FINDINGS: Endotracheal tube with tip at the clavicular heads. The orogastric tube at least reaches the stomach. Interstitial coarsening attributed atelectasis based on prior CT and low lung volumes. There may also be developing edema. There is coronary calcification and a partially calcified left ventricular apex from remote infarct. IMPRESSION: 1. Unremarkable hardware positioning. 2. Low lung volumes with atelectasis. There may be developing edema. Electronically Signed   By: Marnee Spring M.D.   On: May 14, 2019 05:37   Ct Angio Chest/abd/pel For Dissection W And/or Wo Contrast  Result Date: 05/25/2019 CLINICAL DATA:  Chest and abdominal pain. EXAM: CT ANGIOGRAPHY CHEST, ABDOMEN AND PELVIS TECHNIQUE: Multidetector CT imaging through the chest, abdomen and pelvis was performed using the standard protocol during bolus administration of intravenous contrast. Multiplanar reconstructed images and MIPs were obtained and reviewed to evaluate the vascular anatomy. CONTRAST:  ISOVUE-370 IOPAMIDOL (ISOVUE-370) INJECTION 76% COMPARISON:  Chest CT 04/05/2018 FINDINGS: CTA CHEST FINDINGS Cardiovascular: Diffuse aortic atherosclerosis and coronary artery calcifications. No aneurysm or dissection. Heart is mildly enlarged. Calcification and wall thickening at the left ventricle apex compatible with prior infarct. Mediastinum/Nodes: No mediastinal, hilar, or axillary adenopathy. Trachea and esophagus are unremarkable. Thyroid unremarkable. Lungs/Pleura: Dependent opacities noted in both lower lungs/lung bases compatible with dependent atelectasis or scarring. No effusions. Musculoskeletal: Chest wall soft tissues are unremarkable. No acute bony abnormality. Review of the MIP images confirms the  above findings. CTA ABDOMEN AND PELVIS FINDINGS VASCULAR Aorta: Atherosclerotic calcifications throughout the abdominal aorta. No aneurysm or dissection. Celiac: Patent without evidence of aneurysm, dissection, vasculitis or significant stenosis. SMA: Patent without evidence of aneurysm, dissection, vasculitis or significant stenosis. Renals: Calcified plaque at the origin of both renal arteries. Suspect mild stenosis at the origin of the left renal artery. Right renal artery appears widely patent. Small accessory left renal artery noted. IMA: Patent. Inflow: Atherosclerotic calcifications throughout the iliac vessels. No aneurysm, dissection or significant stenosis. Veins: No obvious venous abnormality within the limitations of this arterial phase study. Review of the MIP images confirms the above findings. NON-VASCULAR Hepatobiliary: Small gallstones within the gallbladder. Liver appears shrunken with probable micro nodularity along the surface suggesting cirrhosis. Gallbladder is moderately distended. No biliary ductal dilatation. Pancreas: No focal abnormality or ductal dilatation. Spleen: No focal abnormality.  Normal size. Adrenals/Urinary Tract: Left renal cysts. No suspicious renal or adrenal mass. No hydronephrosis. Urinary bladder unremarkable. Stomach/Bowel: Sigmoid diverticulosis. No active diverticulitis. There is abnormal wall thickening within the cecum and ascending colon concerning for colitis. There is indistinctness and wall thickness within the 1st and 2nd portions of the duodenum. This is concerning for duodenitis, likely peptic ulcer disease. No evidence of bowel obstruction. Lymphatic: No adenopathy Reproductive: No visible focal abnormality. Other: Moderate to large volume ascites in the abdomen and pelvis. There is free air anteriorly within the upper abdomen and in the porta hepatis. Musculoskeletal: No acute bony abnormality. Review of the MIP images confirms the  above findings.  IMPRESSION: Diffuse aortic atherosclerosis. No evidence of aneurysm or dissection. Diffuse coronary artery disease. Cardiomegaly. 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 Dependent atelectasis or scarring in the lower lobes. Free intraperitoneal air. There are 2 areas of abnormal bowel, within the 1st and 2nd portions of the duodenum where there is wall thickening and indistinctness concerning for peptic ulcer disease. I suspect this is the source of the perforation and free air. However, the cecum and ascending colon also appears thick walled concerning for colitis. Appearance of the liver is suspicious for cirrhosis. Recommend clinical correlation. Moderate to large volume ascites. Cholelithiasis. Sigmoid diverticulosis. Critical Value/emergent results were called by telephone at the time of interpretation on 05/08/2019 at 11:33 pm to Dr. Phineas Semen , who verbally acknowledged these results. Electronically Signed   By: Charlett Nose M.D.   On: 05/25/2019 23:36    Anti-infectives: Anti-infectives (From admission, onward)   Start     Dose/Rate Route Frequency Ordered Stop   05/02/19 1000  fluconazole (DIFLUCAN) IVPB 400 mg     400 mg 100 mL/hr over 120 Minutes Intravenous Daily 05/10/2019 1249     05/02/2019 1400  meropenem (MERREM) 1 g in sodium chloride 0.9 % 100 mL IVPB     1 g 200 mL/hr over 30 Minutes Intravenous Every 8 hours 05/23/2019 1251     05/24/2019 1300  fluconazole (DIFLUCAN) IVPB 800 mg     800 mg 200 mL/hr over 120 Minutes Intravenous  Once 05/19/2019 1249 05/14/2019 1656   04/27/2019 0530  meropenem (MERREM) 1 g in sodium chloride 0.9 % 100 mL IVPB  Status:  Discontinued     1 g 200 mL/hr over 30 Minutes Intravenous Every 12 hours 05/10/2019 0518 05/18/2019 1251   05/07/2019 0515  metroNIDAZOLE (FLAGYL) IVPB 500 mg  Status:  Discontinued     500 mg 100 mL/hr over 60 Minutes Intravenous Every 8 hours 05/25/2019 0507 05/06/2019 0509   05/20/2019 0515   piperacillin-tazobactam (ZOSYN) IVPB 3.375 g  Status:  Discontinued     3.375 g 12.5 mL/hr over 240 Minutes Intravenous Every 8 hours 04/28/2019 0508 05/26/2019 0512   05/02/2019 2345  ciprofloxacin (CIPRO) IVPB 400 mg     400 mg 200 mL/hr over 60 Minutes Intravenous  Once 05/26/2019 2335 05/12/2019 0058   05/13/2019 2345  metroNIDAZOLE (FLAGYL) IVPB 500 mg  Status:  Discontinued     500 mg 100 mL/hr over 60 Minutes Intravenous  Once 04/28/2019 2335 05/20/2019 0509      Assessment/Plan:  POD # 1 still septic from perforated ulcer, improving hemodynamics Continue NPO NGT, drain and antibiotics.  Supportive care for resp and CV systems No need for re-intervention Anticipate large ascites to continue to drain given ongoing fluid resuscitation and cirrhosis. May benefit from TPN since I would anticipate ileus and inability to feed the distal bowel before testing the repair. Will need gastrografin study once his HD recover , anticipate 3-5 days. Appreciate critical care team help   Sterling Big, MD, [facility]  05/02/2019

## 2019-05-02 NOTE — Progress Notes (Signed)
Pharmacy Electrolyte Monitoring Consult:  Pharmacy consulted to assist in monitoring and replacing electrolytes in this 73 y.o. male admitted on 05/16/2019 with Abdominal Pain   Labs:  Sodium (mmol/Darren [name])  Date Value  05/02/2019 131 (Darren [name])  08/28/2012 139   Potassium (mmol/Darren [name])  Date Value  05/02/2019 4.7  08/28/2012 3.6   Calcium (mg/dL)  Date Value  23/55/7322 7.0 (Darren [name])   Calcium, Total (mg/dL)  Date Value  02/54/2706 8.9   Albumin (g/dL)  Date Value  23/76/2831 2.8 (Darren [name])  07/12/2012 3.8   Corrected calcium: 8   Assessment/Plan: Started on CIWA and banana bag daily. Patient ordered LR at 168mL/hr.   No replacement warranted at this time.   Will check electrolytes with am labs.   Will replace for goal potassium ~ 4 and goal magnesium ~ 2.   Pharmacy will continue to monitor and adjust per consult.   Darren Darren [name] 05/02/2019 4:36 PM

## 2019-05-02 NOTE — Progress Notes (Signed)
Pharmacy Antibiotic Note  Darren [name] is a 73 y.o. male admitted on 05/15/2019 with abdominal pain. Patient with perforated viscous on CT and is s/p exploratory laparotomy and repair of perforation. Patient with significant history of alcohol abuse. Patient with penicillin allergy, with reaction of hives. Pharmacy has been consulted for fluconazole dosing.  Plan: Fluconazole 400mg  IV Q24hr.   Continue meropenem 1g IV Q8hr.   Height: 6' (182.9 cm) Weight: 179 lb 0.2 oz (81.2 kg) IBW/kg (Calculated) : 77.6  Temp (24hrs), Avg:97.4 F (36.3 C), Min:97.2 F (36.2 C), Max:97.7 F (36.5 C)  Recent Labs  Lab 04/28/2019 2156 05/07/2019 0040 05/12/2019 1119 05/19/2019 1120 04/26/2019 1438 05/02/19 0322  WBC 2.2*  --   --  13.1*  --  17.1*  CREATININE  --  2.01*  --  1.41*  --  1.21  LATICACIDVEN  --   --  3.8*  --  3.1*  --     Estimated Creatinine Clearance: 62.4 mL/min (by C-G formula based on SCr of 1.21 mg/dL).    Allergies  Allergen Reactions  . Penicillins Hives    Antimicrobials this admission: Ciprofloxacin 5/5 x 1 Metronidazole 5/6 x 1 Meropenem 5/6 >>  Fluconazole 5/6 >>   Dose adjustments this admission: N/A  Microbiology results: 5/6 BCx: no growth x 1  5/6 MRSA PCR: negative  5/6 COVID19: negative   Thank you for allowing pharmacy to be a part of this patient's care.  Simpson,Michael L 05/02/2019 4:37 PM

## 2019-05-02 NOTE — Progress Notes (Signed)
MD ordered to hold vaso for now. Will continue to monitor.

## 2019-05-02 NOTE — Progress Notes (Signed)
Patient with severe critical illness with Multiorgan failure  Heart failure, extensive liver disease, Septic shock in setting perforated Viscus in setting of severe ETOH abuse  Patient appreciated phone call.     Lucie Leather, M.D.  [facility]  Medical Director [facility] Medical Director [facility] Cardio-Pulmonary Department

## 2019-05-02 NOTE — Consult Note (Signed)
Consultation Note Date: 05/02/2019   Patient Name: Darren [name]  DOB: 07-Nov-1946  MRN: [id number]  Age / Sex: 73 y.o., male  PCP: Dion Body, MD Referring Physician: Fredirick Maudlin, MD  Reason for Consultation: Establishing goals of care  HPI/Patient Profile:  73 year old male admitted with perforated viscus.  General surgery took patient emergently to the OR for exploratory laparotomy and repair of perforated ulcer (ulcer near the pylorus).  Found to have approximately 2 L of ascites within abdominal cavity, and the liver was found to be nodular and cirrhotic.  New diagnosis of cirrhosis. He returned to ICU post procedure, and remains intubated due to hemodynamic instability.   Clinical Assessment and Goals of Care: Patient is resting in bed on ventilator. Spoke with his wife. She states since his heart attack, he had been rehabilitating well and working out on his elliptical.  Over the past 4-6 weeks, he has not felt as well, and his appetite had decreased.   She states she has been updated on his status including his liver, but voices concern she is not called with updates, and must call to obtain them herself. She states it is not easy to not be able to come and see him.   She states after surgery with his MI, he left the hospital himself the next day. She states he was a POW for 16 months, and he needs to be worked with gently with plenty of explanation.  She states the couple have POA papers but no living will. She states "I know exactly what he wants". She states he has told her "save me if you can, but I don't want to live on a machine the rest of my life." She would like full code and full scope of tx.      SUMMARY OF RECOMMENDATIONS   Full scope of tx. Please update wife daily.    Code Status/Advance Care Planning:  Full code  Prognosis:   Unable to determine  Discharge  Planning: To Be Determined      Primary Diagnoses: Present on Admission: . Perforated viscus   I have reviewed the medical record, interviewed the patient and family, and examined the patient. The following aspects are pertinent.  Past Medical History:  Diagnosis Date  . Hypertension   . MI (myocardial infarction) ([place]) 2013   Social History   Socioeconomic History  . Marital status: Married    Spouse name: Not on file  . Number of children: Not on file  . Years of education: Not on file  . Highest education level: Not on file  Occupational History  . Not on file  Social Needs  . Financial resource strain: Not on file  . Food insecurity:    Worry: Not on file    Inability: Not on file  . Transportation needs:    Medical: Not on file    Non-medical: Not on file  Tobacco Use  . Smoking status: Not on file  Substance and Sexual Activity  . Alcohol use: Not  on file  . Drug use: Not on file  . Sexual activity: Not on file  Lifestyle  . Physical activity:    Days per week: Not on file    Minutes per session: Not on file  . Stress: Not on file  Relationships  . Social connections:    Talks on phone: Not on file    Gets together: Not on file    Attends religious service: Not on file    Active member of club or organization: Not on file    Attends meetings of clubs or organizations: Not on file    Relationship status: Not on file  Other Topics Concern  . Not on file  Social History Narrative  . Not on file   No family history on file. Scheduled Meds: . chlorhexidine gluconate (MEDLINE KIT)  15 mL Mouth Rinse BID  . mouth rinse  15 mL Mouth Rinse 10 times per day  . pantoprazole (PROTONIX) IV  40 mg Intravenous QHS   Continuous Infusions: . sodium chloride    . dexmedetomidine (PRECEDEX) IV infusion 0.6 mcg/kg/hr (05/02/19 0826)  . DOBUTamine 2.5 mcg/kg/min (05/02/19 1110)  . fentaNYL infusion INTRAVENOUS 200 mcg/hr (05/02/19 1406)  . fluconazole (DIFLUCAN)  IV 400 mg (05/02/19 1114)  . lactated ringers 125 mL/hr at 05/02/19 0801  . meropenem (MERREM) IV 1 g (05/02/19 1357)  . norepinephrine (LEVOPHED) Adult infusion 10 mcg/min (05/02/19 1024)  . banana bag IV 1000 mL 41 mL/hr at 05/02/19 1138  . vasopressin (PITRESSIN) infusion - *FOR SHOCK*     PRN Meds:.sodium chloride, acetaminophen **OR** acetaminophen, fentaNYL, fentaNYL (SUBLIMAZE) injection, fentaNYL (SUBLIMAZE) injection, ipratropium-albuterol, LORazepam Medications Prior to Admission:  Prior to Admission medications   Medication Sig Start Date End Date Taking? Authorizing Provider  aspirin EC 81 MG tablet Take 81 mg by mouth daily.   Yes [provider]  atorvastatin (LIPITOR) 80 MG tablet Take 80 mg by mouth every evening. 03/10/19  Yes [provider]  Cholecalciferol (VITAMIN D-1000 MAX ST) 25 MCG (1000 UT) tablet Take 1,000 Units by mouth daily.   Yes [provider]  gabapentin (NEURONTIN) 300 MG capsule Take 600 mg by mouth 3 (three) times daily. 04/10/19  Yes [provider]  metoprolol succinate (TOPROL-XL) 25 MG 24 hr tablet Take 25 mg by mouth daily. 03/07/19  Yes [provider]  pyridOXINE (B-6) 50 MG tablet Take 50 mg by mouth daily.   Yes [provider]  ramipril (ALTACE) 5 MG capsule Take 5 mg by mouth daily. 04/08/19  Yes [provider]  thiamine (VITAMIN B-1) 100 MG tablet Take 100 mg by mouth daily.   Yes [provider]  vitamin B-12 (CYANOCOBALAMIN) 500 MCG tablet Take 500 mcg by mouth daily.   Yes [provider]  DULoxetine (CYMBALTA) 20 MG capsule Take 20 mg by mouth daily. 05/23/2019 05/30/19  [provider]   Allergies  Allergen Reactions  . Penicillins Hives   Review of Systems  Unable to perform ROS   Physical Exam Pulmonary:     Comments: On vent.  Abdominal:     Comments: Drain in place.      Vital Signs: BP (!) 107/57   Pulse (!) 122   Temp (!) 97.5 F (36.4  C) (Oral)   Resp 17   Ht 6' (1.829 m)   Wt 81.2 kg   SpO2 97%   BMI 24.28 kg/m  Pain Scale: CPOT   Pain Score: 10-Worst pain ever  SpO2: SpO2: 97 % O2 Device:SpO2: 97 % O2 Flow Rate: .   IO: Intake/output summary:   Intake/Output Summary (Last 24 hours) at 05/02/2019 1416 Last data filed at 05/02/2019 1408 Gross per 24 hour  Intake 4835.87 ml  Output 4390 ml  Net 445.87 ml    LBM: Last BM Date: 05/15/2019 Baseline Weight: Weight: 79.8 kg Most recent weight: Weight: 81.2 kg     Palliative Assessment/Data:   Flowsheet Rows     Most Recent Value  Intake Tab  Referral Department  Critical care  Unit at Time of Referral  ICU  Date Notified  05/02/19  Palliative Care Type  New Palliative care  Reason for referral  Clarify Goals of Care  Date of Admission  05/05/2019  # of days IP prior to Palliative referral  2  Clinical Assessment  Psychosocial & Spiritual Assessment  Palliative Care Outcomes      Time In: 1:55 Time Out: 2:45 Time Total: 50 min Greater than 50%  of this time was spent counseling and coordinating care related to the above assessment and plan.  Signed by: Asencion Gowda, NP   Please contact Palliative Medicine Team phone at [phone] for questions and concerns.  For individual provider: See Shea Evans

## 2019-05-03 ENCOUNTER — Inpatient Hospital Stay: Payer: Medicare HMO

## 2019-05-03 DIAGNOSIS — J95821 Acute postprocedural respiratory failure: Secondary | ICD-10-CM

## 2019-05-03 DIAGNOSIS — K7031 Alcoholic cirrhosis of liver with ascites: Secondary | ICD-10-CM

## 2019-05-03 DIAGNOSIS — Z9911 Dependence on respirator [ventilator] status: Secondary | ICD-10-CM

## 2019-05-03 LAB — CBC WITH DIFFERENTIAL/PLATELET
Abs Immature Granulocytes: 0.08 10*3/uL — ABNORMAL HIGH (ref 0.00–0.07)
Basophils Absolute: 0 10*3/uL (ref 0.0–0.1)
Basophils Relative: 0 %
Eosinophils Absolute: 0.2 10*3/uL (ref 0.0–0.5)
Eosinophils Relative: 1 %
HCT: 23.8 % — ABNORMAL LOW (ref 39.0–52.0)
Hemoglobin: 7.9 g/dL — ABNORMAL LOW (ref 13.0–17.0)
Immature Granulocytes: 1 %
Lymphocytes Relative: 9 %
Lymphs Abs: 1.1 10*3/uL (ref 0.7–4.0)
MCH: 33.5 pg (ref 26.0–34.0)
MCHC: 33.2 g/dL (ref 30.0–36.0)
MCV: 100.8 fL — ABNORMAL HIGH (ref 80.0–100.0)
Monocytes Absolute: 1 10*3/uL (ref 0.1–1.0)
Monocytes Relative: 8 %
Neutro Abs: 10.4 10*3/uL — ABNORMAL HIGH (ref 1.7–7.7)
Neutrophils Relative %: 81 %
Platelets: 121 10*3/uL — ABNORMAL LOW (ref 150–400)
RBC: 2.36 MIL/uL — ABNORMAL LOW (ref 4.22–5.81)
RDW: 16.7 % — ABNORMAL HIGH (ref 11.5–15.5)
Smear Review: NORMAL
WBC: 13.5 10*3/uL — ABNORMAL HIGH (ref 4.0–10.5)
nRBC: 0 % (ref 0.0–0.2)

## 2019-05-03 LAB — COMPREHENSIVE METABOLIC PANEL
ALT: 74 U/L — ABNORMAL HIGH (ref 0–44)
ALT: 57 U/L — ABNORMAL HIGH (ref 0–44)
AST: 147 U/L — ABNORMAL HIGH (ref 15–41)
AST: 132 U/L — ABNORMAL HIGH (ref 15–41)
Albumin: 2.6 g/dL — ABNORMAL LOW (ref 3.5–5.0)
Albumin: 3.4 g/dL — ABNORMAL LOW (ref 3.5–5.0)
Alkaline Phosphatase: 90 U/L (ref 38–126)
Alkaline Phosphatase: 80 U/L (ref 38–126)
Anion gap: 7 (ref 5–15)
Anion gap: 12 (ref 5–15)
BUN: 18 mg/dL (ref 8–23)
BUN: 19 mg/dL (ref 8–23)
CO2: 20 mmol/L — ABNORMAL LOW (ref 22–32)
CO2: 15 mmol/L — ABNORMAL LOW (ref 22–32)
Calcium: 7.1 mg/dL — ABNORMAL LOW (ref 8.9–10.3)
Calcium: 7.5 mg/dL — ABNORMAL LOW (ref 8.9–10.3)
Chloride: 105 mmol/L (ref 98–111)
Chloride: 106 mmol/L (ref 98–111)
Creatinine, Ser: 1 mg/dL (ref 0.61–1.24)
Creatinine, Ser: 0.99 mg/dL (ref 0.61–1.24)
GFR calc Af Amer: >60 mL/min (ref >60)
GFR calc Af Amer: >60 mL/min (ref >60)
GFR calc non Af Amer: >60 mL/min (ref >60)
GFR calc non Af Amer: >60 mL/min (ref >60)
Glucose, Bld: 147 mg/dL — ABNORMAL HIGH (ref 70–99)
Glucose, Bld: 89 mg/dL (ref 70–99)
Potassium: 3.9 mmol/L (ref 3.5–5.1)
Potassium: 4 mmol/L (ref 3.5–5.1)
Sodium: 132 mmol/L — ABNORMAL LOW (ref 135–145)
Sodium: 133 mmol/L — ABNORMAL LOW (ref 135–145)
Total Bilirubin: 2.7 mg/dL — ABNORMAL HIGH (ref 0.3–1.2)
Total Bilirubin: 2.6 mg/dL — ABNORMAL HIGH (ref 0.3–1.2)
Total Protein: 4.3 g/dL — ABNORMAL LOW (ref 6.5–8.1)
Total Protein: 4.9 g/dL — ABNORMAL LOW (ref 6.5–8.1)

## 2019-05-03 LAB — BLOOD GAS, ARTERIAL
Acid-base deficit: 13.3 mmol/L — ABNORMAL HIGH (ref 0.0–2.0)
Bicarbonate: 14.6 mmol/L — ABNORMAL LOW (ref 20.0–28.0)
FIO2: 0.7
O2 Saturation: 83.1 %
Patient temperature: 37
pCO2 arterial: 40 mmHg (ref 32.0–48.0)
pH, Arterial: 7.17 — CL (ref 7.350–7.450)
pO2, Arterial: 61 mmHg — ABNORMAL LOW (ref 83.0–108.0)

## 2019-05-03 LAB — CBC
HCT: 21.7 % — ABNORMAL LOW (ref 39.0–52.0)
Hemoglobin: 6.9 g/dL — ABNORMAL LOW (ref 13.0–17.0)
MCH: 33.2 pg (ref 26.0–34.0)
MCHC: 31.8 g/dL (ref 30.0–36.0)
MCV: 104.3 fL — ABNORMAL HIGH (ref 80.0–100.0)
Platelets: 113 10*3/uL — ABNORMAL LOW (ref 150–400)
RBC: 2.08 MIL/uL — ABNORMAL LOW (ref 4.22–5.81)
RDW: 16.4 % — ABNORMAL HIGH (ref 11.5–15.5)
WBC: 18.9 10*3/uL — ABNORMAL HIGH (ref 4.0–10.5)
nRBC: 0 % (ref 0.0–0.2)

## 2019-05-03 LAB — MAGNESIUM
Magnesium: 1.4 mg/dL — ABNORMAL LOW (ref 1.7–2.4)
Magnesium: 2.6 mg/dL — ABNORMAL HIGH (ref 1.7–2.4)

## 2019-05-03 LAB — PREPARE RBC (CROSSMATCH)

## 2019-05-03 LAB — PHOSPHORUS
Phosphorus: 3.3 mg/dL (ref 2.5–4.6)
Phosphorus: 3.3 mg/dL (ref 2.5–4.6)

## 2019-05-03 MED ORDER — ORAL CARE MOUTH RINSE
15.0000 mL | OROMUCOSAL | Status: DC
  Administered 2019-05-03 – 2019-05-10 (×61): 15 mL via OROMUCOSAL

## 2019-05-03 MED ORDER — FENTANYL BOLUS VIA INFUSION
25.0000 ug | INTRAVENOUS | Status: DC | PRN
  Filled 2019-05-03: qty 25

## 2019-05-03 MED ORDER — FENTANYL CITRATE (PF) 100 MCG/2ML IJ SOLN
200.0000 ug | Freq: Once | INTRAMUSCULAR | Status: AC
  Administered 2019-05-03: 200 ug via INTRAVENOUS
  Filled 2019-05-03: qty 4

## 2019-05-03 MED ORDER — GLYCOPYRROLATE 0.2 MG/ML IJ SOLN: 0.1000 mg | Freq: Once | INTRAMUSCULAR | Status: DC

## 2019-05-03 MED ORDER — LACTATED RINGERS IV SOLN
INTRAVENOUS | Status: DC
  Administered 2019-05-03: 12:00:00 via INTRAVENOUS

## 2019-05-03 MED ORDER — ALBUMIN HUMAN 25 % IV SOLN
25.0000 g | Freq: Once | INTRAVENOUS | Status: AC
  Administered 2019-05-03: 25 g via INTRAVENOUS
  Filled 2019-05-03: qty 100

## 2019-05-03 MED ORDER — MIDAZOLAM HCL 2 MG/2ML IJ SOLN
4.0000 mg | Freq: Once | INTRAMUSCULAR | Status: AC
  Administered 2019-05-03: 4 mg via INTRAVENOUS

## 2019-05-03 MED ORDER — SODIUM CHLORIDE 0.9% IV SOLUTION
Freq: Once | INTRAVENOUS | Status: AC
  Administered 2019-05-03: 23:00:00 via INTRAVENOUS

## 2019-05-03 MED ORDER — MIDAZOLAM HCL 2 MG/2ML IJ SOLN
1.0000 mg | INTRAMUSCULAR | Status: DC | PRN
  Administered 2019-05-03 – 2019-05-05 (×6): 2 mg via INTRAVENOUS
  Filled 2019-05-03 (×5): qty 2

## 2019-05-03 MED ORDER — MAGNESIUM SULFATE 4 GM/100ML IV SOLN
4.0000 g | Freq: Once | INTRAVENOUS | Status: AC
  Administered 2019-05-03: 4 g via INTRAVENOUS
  Filled 2019-05-03: qty 100

## 2019-05-03 MED ORDER — GLYCOPYRROLATE 0.2 MG/ML IJ SOLN
0.2000 mg | Freq: Four times a day (QID) | INTRAMUSCULAR | Status: DC | PRN
  Administered 2019-05-03: 0.2 mg via INTRAVENOUS
  Filled 2019-05-03: qty 1

## 2019-05-03 MED ORDER — IPRATROPIUM-ALBUTEROL 0.5-2.5 (3) MG/3ML IN SOLN
3.0000 mL | Freq: Four times a day (QID) | RESPIRATORY_TRACT | Status: DC
  Administered 2019-05-04 – 2019-05-17 (×53): 3 mL via RESPIRATORY_TRACT
  Filled 2019-05-03 (×53): qty 3

## 2019-05-03 MED ORDER — FENTANYL CITRATE (PF) 100 MCG/2ML IJ SOLN
50.0000 ug | INTRAMUSCULAR | Status: DC | PRN
  Administered 2019-05-03 – 2019-05-04 (×4): 100 ug via INTRAVENOUS
  Filled 2019-05-03 (×4): qty 2

## 2019-05-03 MED ORDER — FUROSEMIDE 10 MG/ML IJ SOLN
40.0000 mg | Freq: Once | INTRAMUSCULAR | Status: AC
  Administered 2019-05-03: 22:00:00 40 mg via INTRAVENOUS
  Filled 2019-05-03: qty 4

## 2019-05-03 MED ORDER — HYDROCORTISONE NA SUCCINATE PF 100 MG IJ SOLR
50.0000 mg | Freq: Four times a day (QID) | INTRAMUSCULAR | Status: DC
  Administered 2019-05-03 (×2): 50 mg via INTRAVENOUS
  Filled 2019-05-03 (×2): qty 2

## 2019-05-03 MED ORDER — FENTANYL CITRATE (PF) 100 MCG/2ML IJ SOLN
25.0000 ug | Freq: Once | INTRAMUSCULAR | Status: AC
  Administered 2019-05-03: 25 ug via INTRAVENOUS

## 2019-05-03 MED ORDER — SODIUM BICARBONATE 8.4 % IV SOLN
INTRAVENOUS | Status: DC
  Administered 2019-05-03: 23:00:00 via INTRAVENOUS
  Filled 2019-05-03 (×2): qty 150

## 2019-05-03 MED ORDER — ETOMIDATE 2 MG/ML IV SOLN
20.0000 mg | Freq: Once | INTRAVENOUS | Status: AC
  Administered 2019-05-03: 10 mg via INTRAVENOUS
  Filled 2019-05-03: qty 10

## 2019-05-03 MED ORDER — DEXMEDETOMIDINE HCL IN NACL 400 MCG/100ML IV SOLN
0.2000 ug/kg/h | INTRAVENOUS | Status: DC
  Filled 2019-05-03: qty 100

## 2019-05-03 MED ORDER — SENNOSIDES 8.8 MG/5ML PO SYRP
5.0000 mL | ORAL_SOLUTION | Freq: Two times a day (BID) | ORAL | Status: DC | PRN
  Administered 2019-05-07 – 2019-05-14 (×3): 5 mL
  Filled 2019-05-03 (×4): qty 5

## 2019-05-03 MED ORDER — ALBUMIN HUMAN 25 % IV SOLN
25.0000 g | Freq: Four times a day (QID) | INTRAVENOUS | Status: AC
  Administered 2019-05-03 – 2019-05-04 (×4): 25 g via INTRAVENOUS
  Filled 2019-05-03 (×4): qty 100

## 2019-05-03 MED ORDER — DOBUTAMINE IN D5W 4-5 MG/ML-% IV SOLN
2.5000 ug/kg/min | INTRAVENOUS | Status: DC
  Filled 2019-05-03: qty 250

## 2019-05-03 MED ORDER — BISACODYL 10 MG RE SUPP: 10.0000 mg | Freq: Every day | RECTAL | Status: DC | PRN

## 2019-05-03 MED ORDER — MIDAZOLAM HCL 2 MG/2ML IJ SOLN
1.0000 mg | INTRAMUSCULAR | Status: DC | PRN
  Filled 2019-05-03 (×3): qty 2

## 2019-05-03 MED ORDER — FENTANYL 2500MCG IN NS 250ML (10MCG/ML) PREMIX INFUSION
25.0000 ug/h | INTRAVENOUS | Status: DC
  Administered 2019-05-03: 100 ug/h via INTRAVENOUS
  Filled 2019-05-03: qty 250

## 2019-05-03 MED ORDER — MIDAZOLAM HCL 2 MG/2ML IJ SOLN
INTRAMUSCULAR | Status: AC
  Filled 2019-05-03: qty 4

## 2019-05-03 MED ORDER — CHLORHEXIDINE GLUCONATE 0.12% ORAL RINSE (MEDLINE KIT)
15.0000 mL | Freq: Two times a day (BID) | OROMUCOSAL | Status: DC
  Administered 2019-05-04 – 2019-05-10 (×13): 15 mL via OROMUCOSAL

## 2019-05-03 MED ORDER — HYDROCORTISONE NA SUCCINATE PF 100 MG IJ SOLR
50.0000 mg | Freq: Two times a day (BID) | INTRAMUSCULAR | Status: DC
  Administered 2019-05-03 – 2019-05-04 (×3): 50 mg via INTRAVENOUS
  Filled 2019-05-03 (×3): qty 2

## 2019-05-03 MED ORDER — SODIUM BICARBONATE 8.4 % IV SOLN
100.0000 meq | Freq: Once | INTRAVENOUS | Status: AC
  Administered 2019-05-03: 100 meq via INTRAVENOUS

## 2019-05-03 NOTE — Progress Notes (Signed)
Late note: Patient was transitioned over to heated high flow unit from a bubble high flow due to low sats.  70% 50Liters. ABG obtained. Patient with copious secretions noted in airway and training from mouth. NP made decision to intubate.  Patient intubated without difficulty and placed on vent.

## 2019-05-03 NOTE — Procedures (Signed)
Intubation Procedure Note Darren [name] [id number] Oct 10, 1946  Procedure: Intubation Indications: Respiratory insufficiency  Procedure Details Consent: Risks of procedure as well as the alternatives and risks of each were explained to the (patient/caregiver).  Consent for procedure obtained. Time Out: Verified patient identification, verified procedure, site/side was marked, verified correct patient position, special equipment/implants available, medications/allergies/relevent history reviewed, required imaging and test results available.  Performed  Maximum sterile technique was used including antiseptics, cap, gloves, gown, hand hygiene and mask.  MAC and 3    Evaluation Hemodynamic Status: BP stable throughout; O2 sats: transiently fell during during procedure and currently acceptable Patient's Current Condition: stable Complications: No apparent complications Patient did tolerate procedure well. Chest X-ray ordered to verify placement.  CXR: tube position acceptable.  Procedure performed under direct supervision of Dr.Simonds. Ultrasound utilized for realtime vessel cannulation  Darren [name] 05/03/2019

## 2019-05-03 NOTE — Progress Notes (Signed)
Patient extubated to 2l [place], per MD order, with no complications. Saturations are 95% at this time, will continue to monitor and encourage patient to cough.

## 2019-05-03 NOTE — Progress Notes (Signed)
Initial Nutrition Assessment  DOCUMENTATION CODES:   Not applicable  INTERVENTION:   RD will monitor for diet advancement vs the need for nutrition support.  Pt likely at high refeed risk   NUTRITION DIAGNOSIS:   Inadequate oral intake related to acute illness as evidenced by NPO status.  GOAL:   Patient will meet greater than or equal to 90% of their needs  MONITOR:   Diet advancement, Labs, Weight trends, I & O's, Skin  REASON FOR ASSESSMENT:   Ventilator    ASSESSMENT:   73 y/o male with h/o MI and heavy etoh abuse admitted with perforated ulcer near the pylorus (unable to discern definitively if distal stomach or proximal duodenum) now s/p ex lap with graham patch 5/6. Pt also found to have new liver cirrhosis with 2L ascites noted during surgery   RD working remotely.  Pt extubated today; RD will continue to follow as pt may require nutrition support. Suspect poor appetite and oral intake pta r/t etoh abuse. Per chart, pt appears fairly weight stable pta. RD will monitor for diet advancement vs the need for nutrition support. JP drain with output. Suspect pt at high refeed risk. Palliative care following for GOC.   Medications reviewed and include: protonix, albumin, diflucan, meropenem, LRS @50ml /hr, levophed, MVI, thiamine, folic acid   Labs reviewed: Na 132(L), P 3.3 wnl, Mg 1.4(L) AST 147(H), ALT 74(H), tbili 2.7(H) Triglycerides- 53- 5/6 Wbc- 13.5(H), Hgb 7.9(L), Hct 23.8(L), MCV 100.8(H)  Unable to complete Nutrition-Focused physical exam at this time.   Diet Order:   Diet Order            Diet NPO time specified  Diet effective now             EDUCATION NEEDS:   No education needs have been identified at this time  Skin:  Skin Assessment: Reviewed RN Assessment(ecchymosis, incision abdomen )  Last BM:  5/5  Height:   Ht Readings from Last 1 Encounters:  05/25/2019 6' (1.829 m)    Weight:   Wt Readings from Last 1 Encounters:   05/16/2019 81.2 kg    Ideal Body Weight:  80.9 kg  BMI:  Body mass index is 24.28 kg/m.  Estimated Nutritional Needs:   Kcal:  2100-2400kcal/day   Protein:  105-120g/day   Fluid:  >2L/day   Betsey Holiday MS, RD, LDN Pager #- [phone] Office#- [phone] After Hours Pager: [phone]

## 2019-05-03 NOTE — Progress Notes (Addendum)
Subjective:  CC: Darren [name] is a 73 y.o. male  Hospital stay day 3, 2 Days Post-Op ex-lap, graham patch repair  HPI: Called by NP on floor, patient requiring re-intubation.  ROS:  Unable to obtain secondary to patient status  Objective:   Temp:  [97.6 F (36.4 C)-98.5 F (36.9 C)] 98.1 F (36.7 C) (05/08 1940) Pulse Rate:  [91-149] 128 (05/08 2200) Resp:  [10-22] 14 (05/08 2200) BP: (85-123)/(38-80) 88/53 (05/08 2200) SpO2:  [83 %-100 %] 100 % (05/08 2200) Arterial Line BP: (104-126)/(36-104) 113/104 (05/08 1300) FiO2 (%):  [30 %-75 %] 75 % (05/08 2200)     Height: 6' (182.9 cm) Weight: 81.2 kg BMI (Calculated): 24.27   Intake/Output this shift:   Intake/Output Summary (Last 24 hours) at 05/03/2019 2233 Last data filed at 05/03/2019 2207 Gross per 24 hour  Intake 5176.74 ml  Output 4935 ml  Net 241.74 ml    Constitutional :  vented, sedated  Gastrointestinal: soft, no involuntary guarding, staples c/d/i.  drain with serosanguinous output, no drastic change in amount or quality per report.   Skin: Cool and moist.   Psychiatric: Normal affect, non-agitated, not confused       LABS:  CMP Latest Ref Rng & Units 05/03/2019 05/03/2019 05/02/2019  Glucose 70 - 99 mg/dL 89 409(W147(H) 119(J113(H)  BUN 8 - 23 mg/dL 19 18 18   Creatinine 0.61 - 1.24 mg/dL 4.780.99 2.951.00 6.211.21  Sodium 135 - 145 mmol/L 133(L) 132(L) 131(L)  Potassium 3.5 - 5.1 mmol/L 4.0 3.9 4.7  Chloride 98 - 111 mmol/L 106 105 104  CO2 22 - 32 mmol/L 15(L) 20(L) 18(L)  Calcium 8.9 - 10.3 mg/dL 7.5(L) 7.1(L) 7.0(L)  Total Protein 6.5 - 8.1 g/dL 4.9(L) 4.3(L) 4.7(L)  Total Bilirubin 0.3 - 1.2 mg/dL 2.6(H) 2.7(H) 3.1(H)  Alkaline Phos 38 - 126 U/L 80 90 82  AST 15 - 41 U/L 132(H) 147(H) 163(H)  ALT 0 - 44 U/L 57(H) 74(H) 68(H)   CBC Latest Ref Rng & Units 05/03/2019 05/03/2019 05/02/2019  WBC 4.0 - 10.5 K/uL 18.9(H) 13.5(H) 17.1(H)  Hemoglobin 13.0 - 17.0 g/dL 6.9(L) 7.9(L) 8.2(L)  Hematocrit 39.0 - 52.0 % 21.7(L) 23.8(L) 24.4(L)   Platelets 150 - 400 K/uL 113(L) 121(L) 133(L)    RADS: CLINICAL DATA:  73 y/o  M; ETT.  EXAM: PORTABLE CHEST 1 VIEW  COMPARISON:  05/02/2019 chest radiograph  FINDINGS: Left central venous catheter tip projects over mid SVC. Endotracheal tube tip 4.7 cm above carina. Enteric tube tip extends below the field of view into the abdomen. Diffuse consolidations greatest in mid and lower lung zones are increased from prior chest radiograph. No pneumothorax. Probable small left effusion. Stable cardiac silhouette given projection and technique. Bones are unremarkable.  IMPRESSION: 1. Support apparatus as above. 2. Diffuse consolidations greatest in mid and lower lung zones are increased from prior chest radiograph.   Electronically Signed   By: Mitzi HansenLance  Furusawa-Stratton M.D.   On: 05/03/2019 22:27 Assessment:   S/p ex-lap graham patch repair for perforated gastric ulcer.  Extubated this am, but reintubated due to increased respiratory work, eventual failure per report.  CXR shows drastic worsening of consolidation in bilater lower lungs.  Although hgb did drop a gram since this am, the overall quality and amount of output from JP has not changed per report.  Critical care team will start 1u pRBC transfusion, and will monitor response.  Currently do not believe that a re-bleed will causie this degree of respiratory failure.  Especially on POD#2 is unlikely.  Report of copious sputum and secretion was reported prior to intubation so possibility of aspiration cannot be excluded.  Appreciate aggressive resuscitation efforts per ICU team.  No need for surgical re-intervention at this point.  Recommend continue ICU support

## 2019-05-03 NOTE — Progress Notes (Signed)
05/03/2019  Subjective: Patient is 2 Days Post-Op.  Patient extubated thismorning.  Continues on pressors, NPO, with a lot of drainage from JP drain.    Vital signs: Temp:  [97.3 F (36.3 C)-98.1 F (36.7 C)] 98.1 F (36.7 C) (05/08 1200) Pulse Rate:  [91-123] 121 (05/08 1400) Resp:  [10-20] 18 (05/08 0800) BP: (93-123)/(55-80) 102/63 (05/08 1400) SpO2:  [92 %-100 %] 96 % (05/08 1400) Arterial Line BP: (104-126)/(36-104) 113/104 (05/08 1300) FiO2 (%):  [30 %-40 %] 30 % (05/08 1130)   Intake/Output: 05/07 0701 - 05/08 0700 In: 5596 [I.V.:5170.2; IV Piggyback:425.8] Out: 4130 [Urine:855; Drains:3275] Last BM Date: 05/12/2019  Physical Exam: Constitutional: No acute distress, delirium disoriented Abdomen:  Soft, non-distended, does not appear tender to palpation. Midline incision clean, dry, intact with staples.  JP drain with serosanguinous fluid.  Labs:  Recent Labs    05/02/19 0322 05/03/19 0455  WBC 17.1* 13.5*  HGB 8.2* 7.9*  HCT 24.4* 23.8*  PLT 133* 121*   Recent Labs    05/02/19 0322 05/03/19 0455  NA 131* 132*  K 4.7 3.9  CL 104 105  CO2 18* 20*  GLUCOSE 113* 147*  BUN 18 18  CREATININE 1.21 1.00  CALCIUM 7.0* 7.1*   No results for input(s): LABPROT, INR in the last 72 hours.  Imaging: No results found.  Assessment/Plan: This is a 73 y.o. male s/p exlap, repair of gastric perforation.  --continue NPO, IV fluid hydration, albumin repletion for JP drainage, wean pressors as tolerated. --Nothing per NG tube please.  Will check UGI on Monday to check repair.   Howie Ill, MD [facility]

## 2019-05-03 NOTE — Progress Notes (Signed)
Patient desaturated on 4Ls, changed to HFNC at 7L. Sats increased to 91%.

## 2019-05-03 NOTE — Progress Notes (Signed)
Pharmacy Antibiotic Note  Darren [name] is a 73 y.o. male admitted on 04/29/2019 with abdominal pain. Patient with perforated viscous on CT and is s/p exploratory laparotomy and repair of perforation. Patient with significant history of alcohol abuse. Patient with penicillin allergy, with reaction of hives. Pharmacy has been consulted for fluconazole dosing.  Plan: Fluconazole 400mg  IV Q24hr.   Meropenem 1g IV Q8hr.   Height: 6' (182.9 cm) Weight: 179 lb 0.2 oz (81.2 kg) IBW/kg (Calculated) : 77.6  Temp (24hrs), Avg:97.5 F (36.4 C), Min:97.3 F (36.3 C), Max:97.7 F (36.5 C)  Recent Labs  Lab 05/12/2019 2156 2019/05/30 0040 05-30-19 1119 30-May-2019 1120 2019-05-30 1438 05/02/19 0322 05/02/19 1510 05/03/19 0455  WBC 2.2*  --   --  13.1*  --  17.1*  --  13.5*  CREATININE  --  2.01*  --  1.41*  --  1.21  --  1.00  LATICACIDVEN  --   --  3.8*  --  3.1*  --  1.3  --     Estimated Creatinine Clearance: 75.4 mL/min (by C-G formula based on SCr of 1 mg/dL).    Allergies  Allergen Reactions  . Penicillins Hives    Antimicrobials this admission: Ciprofloxacin 5/5 x 1 Metronidazole 5/6 x 1 Meropenem 5/6 >>  Fluconazole 5/6 >>   Dose adjustments this admission: N/A  Microbiology results: 5/6 BCx: no growth x 2 days  5/6 MRSA PCR: negative  5/6 COVID19: negative   Thank you for allowing pharmacy to be a part of this patient's care.  Simpson,Michael L 05/03/2019 11:20 AM

## 2019-05-03 NOTE — Progress Notes (Signed)
Patient NT suctioned, got back copious amounts of thick tan/yellow sputum.

## 2019-05-03 NOTE — Progress Notes (Signed)
Daily Progress Note   Patient Name: Darren [name]       Date: 05/03/2019 DOB: June 01, 1946  Age: 73 y.o. MRN#: [id number] Attending Physician: Fredirick Maudlin, MD Primary Care Physician: Dion Body, MD Admit Date: 05/02/2019  Reason for Consultation/Follow-up: Psychosocial/spiritual support  Subjective: Patient is resting in bed with NGT in place and a congested cough. He appears confused and has mittens in place. He has been extubated. Per CCM NP patient's wife has been updated.  Attempted to call wife to offer support, no answer.   Length of Stay: 3  Current Medications: Scheduled Meds:  . chlorhexidine gluconate (MEDLINE KIT)  15 mL Mouth Rinse BID  . hydrocortisone sod succinate (SOLU-CORTEF) inj  50 mg Intravenous Q12H  . mouth rinse  15 mL Mouth Rinse 10 times per day  . pantoprazole (PROTONIX) IV  40 mg Intravenous QHS    Continuous Infusions: . sodium chloride    . albumin human 60 mL/hr at 05/03/19 1400  . DOBUTamine    . fluconazole (DIFLUCAN) IV Stopped (05/03/19 1245)  . lactated ringers 50 mL/hr at 05/03/19 1400  . meropenem (MERREM) IV 1 g (05/03/19 1433)  . norepinephrine (LEVOPHED) Adult infusion 20 mcg/min (05/03/19 1400)  . banana bag IV 1000 mL 41 mL/hr at 05/03/19 1400    PRN Meds: sodium chloride, acetaminophen **OR** [DISCONTINUED] acetaminophen, ipratropium-albuterol, LORazepam  Physical Exam          Vital Signs: BP 102/63   Pulse (!) 121   Temp 98.1 F (36.7 C) (Oral)   Resp 18   Ht 6' (1.829 m)   Wt 81.2 kg   SpO2 96%   BMI 24.28 kg/m  SpO2: SpO2: 96 % O2 Device: O2 Device: Nasal Cannula O2 Flow Rate: O2 Flow Rate (L/min): 4 L/min  Intake/output summary:   Intake/Output Summary (Last 24 hours) at 05/03/2019 1443 Last data filed  at 05/03/2019 1400 Gross per 24 hour  Intake 7264.16 ml  Output 4910 ml  Net 2354.16 ml   LBM: Last BM Date: 05/07/2019 Baseline Weight: Weight: 79.8 kg Most recent weight: Weight: 81.2 kg       Palliative Assessment/Data:    Flowsheet Rows     Most Recent Value  Intake Tab  Referral Department  Critical care  Unit at Time of Referral  ICU  Date Notified  05/02/19  Palliative Care Type  New Palliative care  Reason for referral  Clarify Goals of Care  Date of Admission  05/12/2019  # of days IP prior to Palliative referral  2  Clinical Assessment  Psychosocial & Spiritual Assessment  Palliative Care Outcomes      Patient Active Problem List   Diagnosis Date Noted  . Perforated viscus 05/22/2019    Palliative Care Assessment & Plan     Recommendations/Plan:  Full scope tx and full code.   Code Status:    Code Status Orders  (From admission, onward)         Start     Ordered   05/12/2019 2344  Full code  Continuous     05/06/2019 2350        Code Status History    This patient has a current code status but no historical code status.       Prognosis:   Unable to determine  Discharge Planning:  To Be Determined  Care plan was discussed with CCM NP.  Thank you for allowing the Palliative Medicine Team to assist in the care of this patient.   Total Time 25 min Prolonged Time Billed  no      Greater than 50%  of this time was spent counseling and coordinating care related to the above assessment and plan.  Asencion Gowda, NP  Please contact Palliative Medicine Team phone at [phone] for questions and concerns.

## 2019-05-03 NOTE — Progress Notes (Signed)
PCCM PROGRESS NOTE   PT PROFILE/INITIAL PRESENTATION: 73 y.o. male admitted to ICU from the OR after exploratory laparotomy and repair of perforated pyloric ulcer.  Patient has history of alcohol abuse and intraoperative findings consistent with cirrhosis and significant ascites.  He is intubated postoperatively. PMH of CAD, Htn.  MAJOR EVENTS/TEST RESULTS: 05/06 CT abd/pelvis: Free intraperitoneal air. There are 2 areas of abnormal bowel, within the 1st and 2nd portions of the duodenum where there is wall thickening and indistinctness concerning for peptic ulcer disease. Appearance of the liver is suspicious for cirrhosis. Moderate to large volume ascites 05/06 echocardiogram: LVEF 30-35%.  Severe hypokinesis of apical and periapical regions. 05/06 Admitted as above post op ex laparotomy with finding of perforated pyloric ulcer 05/07 Palliative Care consultation  INDWELLING DEVICES: ETT 05/06 >> 05/08 L IJ CVL 05/06 >>  L radial A-line 05/06 >>    MICRO DATA: SARS-CoV-2 5/5 >> NEG MRSA PCR 05/05 >> NEG Blood 05/05 >>   ANTIMICROBIALS:  Meropenem 05/06 >>  Fluconazole05/06 >>   SUBJ:  On my initial evaluation this morning, he was somnolent while still on dexmedetomidine and fentanyl infusions.  These were discontinued.  Over the course of the morning he became increasingly responsive.  He was placed on a spontaneous breathing trial of 5/5 and was pulling tidal volumes greater than 1000 mL.  Once he was able to follow commands, he was extubated under my supervision and presently seems to be tolerating extubation well.  However he does have rhonchi and does not cough readily on command.  He is in no distress on nasal cannula oxygen.  He is poorly oriented but asks "what happened to me".   Vitals:   05/03/19 1000 05/03/19 1100 05/03/19 1130 05/03/19 1200  BP: 105/60 (!) 104/59  93/80  Pulse: (!) 101 (!) 102  (!) 115  Resp:      Temp:      TempSrc:      SpO2: 98% 99% 96% 92%   Weight:      Height:        Vent Mode: PSV FiO2 (%):  [30 %-40 %] 30 % Set Rate:  [18 bmp] 18 bmp Vt Set:  [450 mL] 450 mL PEEP:  [5 cmH20] 5 cmH20 Pressure Support:  [5 cmH20] 5 cmH20 Plateau Pressure:  [14 cmH20-16 cmH20] 16 cmH20   05/07 0701 - 05/08 0700 In: 5596 [I.V.:5170.2; IV Piggyback:425.8] Out: 4130 [Urine:855; Drains:3275]   Intake/Output Summary (Last 24 hours) at 05/03/2019 1402 Last data filed at 05/03/2019 1252 Gross per 24 hour  Intake 7069 ml  Output 5240 ml  Net 1829 ml     EXAM:  Gen: RASS -3, -4, intubated, sedated HEENT: NCAT, + sclericterus Neck: No JVD Lungs: Scattered rhonchi Cardiovascular: mildly tachy, regular, no M noted Abdomen: Mildly distended, mildly tender, diminished BS Ext: Extensive ecchymoses on both upper ext, moderate symmetric edema of all 4 ext Neuro: CNs intact, moves all extremities, poorly oriented, no focal deficits  DATA:   BMP Latest Ref Rng & Units 05/03/2019 05/02/2019 05/07/2019  Glucose 70 - 99 mg/dL 147(H) 113(H) 106(H)  BUN 8 - 23 mg/dL 18 18 18   Creatinine 0.61 - 1.24 mg/dL 1.00 1.21 1.41(H)  Sodium 135 - 145 mmol/L 132(L) 131(L) 128(L)  Potassium 3.5 - 5.1 mmol/L 3.9 4.7 3.8  Chloride 98 - 111 mmol/L 105 104 100  CO2 22 - 32 mmol/L 20(L) 18(L) 17(L)  Calcium 8.9 - 10.3 mg/dL 7.1(L) 7.0(L) 6.6(L)  CBC Latest Ref Rng & Units 05/03/2019 05/02/2019 05/02/2019  WBC 4.0 - 10.5 K/uL 13.5(H) 17.1(H) -  Hemoglobin 13.0 - 17.0 g/dL 7.9(L) 8.2(L) 8.6(L)  Hematocrit 39.0 - 52.0 % 23.8(L) 24.4(L) 25.2(L)  Platelets 150 - 400 K/uL 121(L) 133(L) -    Hepatic Function Latest Ref Rng & Units 05/03/2019 05/02/2019 05/08/2019  Total Protein 6.5 - 8.1 g/dL 4.3(L) 4.7(L) 4.7(L)  Albumin 3.5 - 5.0 g/dL 2.6(L) 2.8(L) 2.2(L)  AST 15 - 41 U/L 147(H) 163(H) 136(H)  ALT 0 - 44 U/L 74(H) 68(H) 70(H)  Alk Phosphatase 38 - 126 U/L 90 82 143(H)  Total Bilirubin 0.3 - 1.2 mg/dL 2.7(H) 3.1(H) 2.3(H)     Cardiac Panel (last 3 results) No results  for input(s): CKTOTAL, CKMB, TROPONINI, RELINDX in the last 72 hours.  BNP (last 3 results) No results for input(s): BNP in the last 8760 hours.  ProBNP (last 3 results) No results for input(s): PROBNP in the last 8760 hours.    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  Appears to be tolerating well presently Continue to monitor and ICU/SDU Supplemental oxygen to maintain SPO2 >90% Nasotracheal suctioning as needed Encourage patient to cough frequently Continue norepinephrine to maintain MEP >65 mmHg Volume resuscitate with albumin Continue LR infusion at reduced rate (50 cc/hr) Monitor temp, WBC count Micro and abx as above DVT px: SCDs Monitor CBC intermittently Transfuse per usual guidelines Postop management and decisions regarding nutrition per General Surgery  Minimize sedating medications Monitor for signs of alcohol withdrawal  Wife updated over the phone  CCM time: 60 mins The above time includes time spent in consultation with patient and/or family members and reviewing care plan on multidisciplinary rounds  Merton Border, MD PCCM service Mobile [phone] Pager [phone] 05/03/2019 2:02 PM

## 2019-05-03 NOTE — Progress Notes (Signed)
Updated pts wife via telephone regarding plan of care and all questions answered.  Sonda Rumble, AGNP  Pulmonary/Critical Care Pager [phone] (please enter 7 digits) PCCM Consult Pager [phone] (please enter 7 digits) a

## 2019-05-03 NOTE — Progress Notes (Signed)
Pharmacy Electrolyte Monitoring Consult:  Pharmacy consulted to assist in monitoring and replacing electrolytes in this 73 y.o. male admitted on 05/24/2019 with Abdominal Pain   Labs:  Sodium (mmol/L)  Date Value  05/03/2019 132 (L)  08/28/2012 139   Potassium (mmol/L)  Date Value  05/03/2019 3.9  08/28/2012 3.6   Magnesium (mg/dL)  Date Value  29/57/4734 1.4 (L)   Phosphorus (mg/dL)  Date Value  03/70/9643 3.3   Calcium (mg/dL)  Date Value  83/81/8403 7.1 (L)   Calcium, Total (mg/dL)  Date Value  75/43/6067 8.9   Albumin (g/dL)  Date Value  70/34/0352 2.6 (L)  07/12/2012 3.8   Corrected calcium:  8.2  Assessment/Plan: Started on CIWA and banana bag daily. Patient ordered LR at 133mL/hr.   Magnesium 4g IV x 1.   Will check electrolytes with am labs.   Will replace for goal potassium ~ 4 and goal magnesium ~ 2.   Pharmacy will continue to monitor and adjust per consult.   Simpson,Michael L 05/03/2019 7:57 AM

## 2019-05-04 ENCOUNTER — Inpatient Hospital Stay: Payer: Medicare HMO

## 2019-05-04 DIAGNOSIS — Z9889 Other specified postprocedural states: Secondary | ICD-10-CM

## 2019-05-04 DIAGNOSIS — J690 Pneumonitis due to inhalation of food and vomit: Secondary | ICD-10-CM

## 2019-05-04 LAB — COMPREHENSIVE METABOLIC PANEL
ALT: 54 U/L — ABNORMAL HIGH (ref 0–44)
AST: 126 U/L — ABNORMAL HIGH (ref 15–41)
Albumin: 3.7 g/dL (ref 3.5–5.0)
Alkaline Phosphatase: 83 U/L (ref 38–126)
Anion gap: 9 (ref 5–15)
BUN: 18 mg/dL (ref 8–23)
CO2: 24 mmol/L (ref 22–32)
Calcium: 7.2 mg/dL — ABNORMAL LOW (ref 8.9–10.3)
Chloride: 103 mmol/L (ref 98–111)
Creatinine, Ser: 0.92 mg/dL (ref 0.61–1.24)
GFR calc Af Amer: >60 mL/min (ref >60)
GFR calc non Af Amer: >60 mL/min (ref >60)
Glucose, Bld: 159 mg/dL — ABNORMAL HIGH (ref 70–99)
Potassium: 3.3 mmol/L — ABNORMAL LOW (ref 3.5–5.1)
Sodium: 136 mmol/L (ref 135–145)
Total Bilirubin: 4 mg/dL — ABNORMAL HIGH (ref 0.3–1.2)
Total Protein: 4.9 g/dL — ABNORMAL LOW (ref 6.5–8.1)

## 2019-05-04 LAB — TYPE AND SCREEN
ABO/RH(D): O POS
Antibody Screen: NEGATIVE
Unit division: 0
Unit division: 0
Unit division: 0
Unit division: 0
Unit division: 0

## 2019-05-04 LAB — BPAM RBC
Blood Product Expiration Date: 202005062359
Blood Product Expiration Date: 202005072359
Blood Product Expiration Date: 202005082359
Blood Product Expiration Date: 202005152359
Blood Product Expiration Date: 202005192359
ISSUE DATE / TIME: 202005060332
ISSUE DATE / TIME: 202005061109
ISSUE DATE / TIME: 202005061234
ISSUE DATE / TIME: 202005070549
ISSUE DATE / TIME: 202005082259
Unit Type and Rh: 5100
Unit Type and Rh: 5100
Unit Type and Rh: 9500
Unit Type and Rh: 9500
Unit Type and Rh: 9500

## 2019-05-04 LAB — CBC
HCT: 25.3 % — ABNORMAL LOW (ref 39.0–52.0)
Hemoglobin: 8.5 g/dL — ABNORMAL LOW (ref 13.0–17.0)
MCH: 32.8 pg (ref 26.0–34.0)
MCHC: 33.6 g/dL (ref 30.0–36.0)
MCV: 97.7 fL (ref 80.0–100.0)
Platelets: 105 10*3/uL — ABNORMAL LOW (ref 150–400)
RBC: 2.59 MIL/uL — ABNORMAL LOW (ref 4.22–5.81)
RDW: 16.7 % — ABNORMAL HIGH (ref 11.5–15.5)
WBC: 24.7 10*3/uL — ABNORMAL HIGH (ref 4.0–10.5)
nRBC: 0.1 % (ref 0.0–0.2)

## 2019-05-04 LAB — APTT: aPTT: 47 seconds — ABNORMAL HIGH (ref 24–36)

## 2019-05-04 LAB — BASIC METABOLIC PANEL
Anion gap: 8 (ref 5–15)
BUN: 17 mg/dL (ref 8–23)
CO2: 24 mmol/L (ref 22–32)
Calcium: 7.5 mg/dL — ABNORMAL LOW (ref 8.9–10.3)
Chloride: 105 mmol/L (ref 98–111)
Creatinine, Ser: 0.87 mg/dL (ref 0.61–1.24)
GFR calc Af Amer: >60 mL/min (ref >60)
GFR calc non Af Amer: >60 mL/min (ref >60)
Glucose, Bld: 136 mg/dL — ABNORMAL HIGH (ref 70–99)
Potassium: 3.5 mmol/L (ref 3.5–5.1)
Sodium: 137 mmol/L (ref 135–145)

## 2019-05-04 LAB — BLOOD GAS, ARTERIAL
Acid-base deficit: 6.5 mmol/L — ABNORMAL HIGH (ref 0.0–2.0)
Bicarbonate: 20.6 mmol/L (ref 20.0–28.0)
FIO2: 70
MECHVT: 450 mL
Mechanical Rate: 15
O2 Saturation: 90.8 %
PEEP: 5 cmH2O
Patient temperature: 37
pCO2 arterial: 46 mmHg (ref 32.0–48.0)
pH, Arterial: 7.26 — ABNORMAL LOW (ref 7.350–7.450)
pO2, Arterial: 70 mmHg — ABNORMAL LOW (ref 83.0–108.0)

## 2019-05-04 LAB — TRIGLYCERIDES: Triglycerides: 38 mg/dL (ref <150)

## 2019-05-04 LAB — MAGNESIUM: Magnesium: 2.2 mg/dL (ref 1.7–2.4)

## 2019-05-04 LAB — PROTIME-INR
INR: 2.2 — ABNORMAL HIGH (ref 0.8–1.2)
Prothrombin Time: 23.7 seconds — ABNORMAL HIGH (ref 11.4–15.2)

## 2019-05-04 LAB — BRAIN NATRIURETIC PEPTIDE: B Natriuretic Peptide: 2029 pg/mL — ABNORMAL HIGH (ref 0.0–100.0)

## 2019-05-04 LAB — TROPONIN I: Troponin I: 0.29 ng/mL (ref <0.03)

## 2019-05-04 MED ORDER — FENTANYL CITRATE (PF) 100 MCG/2ML IJ SOLN
25.0000 ug | INTRAMUSCULAR | Status: DC | PRN
  Administered 2019-05-05 – 2019-05-06 (×4): 50 ug via INTRAVENOUS
  Filled 2019-05-04 (×4): qty 2

## 2019-05-04 MED ORDER — THIAMINE HCL 100 MG/ML IJ SOLN
100.0000 mg | Freq: Every day | INTRAMUSCULAR | Status: DC
  Administered 2019-05-04 – 2019-05-07 (×4): 100 mg via INTRAVENOUS
  Filled 2019-05-04 (×4): qty 2

## 2019-05-04 MED ORDER — PROPOFOL 1000 MG/100ML IV EMUL
INTRAVENOUS | Status: AC
  Filled 2019-05-04: qty 100

## 2019-05-04 MED ORDER — PROPOFOL 1000 MG/100ML IV EMUL
0.0000 ug/kg/min | INTRAVENOUS | Status: DC
  Administered 2019-05-04 – 2019-05-05 (×2): 15 ug/kg/min via INTRAVENOUS
  Administered 2019-05-05: 12 ug/kg/min via INTRAVENOUS
  Administered 2019-05-06: 9.916 ug/kg/min via INTRAVENOUS
  Administered 2019-05-06: 12 ug/kg/min via INTRAVENOUS
  Administered 2019-05-08 (×2): 10 ug/kg/min via INTRAVENOUS
  Administered 2019-05-09: 5 ug/kg/min via INTRAVENOUS
  Filled 2019-05-04 (×9): qty 100

## 2019-05-04 MED ORDER — POTASSIUM CHLORIDE 10 MEQ/100ML IV SOLN
10.0000 meq | INTRAVENOUS | Status: AC
  Administered 2019-05-04 (×5): 10 meq via INTRAVENOUS
  Filled 2019-05-04 (×5): qty 100

## 2019-05-04 MED ORDER — DEXTROSE IN LACTATED RINGERS 5 % IV SOLN
INTRAVENOUS | Status: DC
  Administered 2019-05-04: 08:00:00 via INTRAVENOUS

## 2019-05-04 MED ORDER — LACTATED RINGERS IV SOLN: INTRAVENOUS | Status: DC

## 2019-05-04 MED ORDER — SODIUM CHLORIDE 0.9 % IV SOLN
Freq: Every day | INTRAVENOUS | Status: DC
  Administered 2019-05-04 – 2019-05-07 (×4): via INTRAVENOUS
  Filled 2019-05-04 (×8): qty 0.2

## 2019-05-04 NOTE — Progress Notes (Signed)
CVP 15.  

## 2019-05-04 NOTE — Progress Notes (Signed)
Subjective:  CC: Darren [name] is a 73 y.o. male  Hospital stay day 4, 3 Days Post-Op ex-lap, graham patch repair  HPI: No new issues since previous exam.  Remains intubated.  ROS:  Unable to obtain secondary to patient status.  Objective:   Temp:  [96.6 F (35.9 C)-98.5 F (36.9 C)] 96.6 F (35.9 C) (05/09 0800) Pulse Rate:  [97-149] 121 (05/09 1400) Resp:  [10-23] 23 (05/09 1400) BP: (68-111)/(38-76) 111/76 (05/09 1400) SpO2:  [83 %-100 %] 92 % (05/09 1400) FiO2 (%):  [65 %-75 %] 65 % (05/09 1220) Weight:  [87.4 kg] 87.4 kg (05/09 0400)     Height: 6' (182.9 cm) Weight: 87.4 kg BMI (Calculated): 26.13   Intake/Output this shift:   Intake/Output Summary (Last 24 hours) at 05/04/2019 1429 Last data filed at 05/04/2019 1127 Gross per 24 hour  Intake 3519.76 ml  Output 4455 ml  Net -935.24 ml    Constitutional :  vented, sedated  Gastrointestinal: soft, no involuntary guarding, staples c/d/i.  drain with serosanguinous output, no drastic change in amount or quality since last exam.   Skin: Cool and moist.   Psychiatric: Normal affect, non-agitated, not confused       LABS:  CMP Latest Ref Rng & Units 05/04/2019 05/04/2019 05/03/2019  Glucose 70 - 99 mg/dL 940(H) 680(S) 89  BUN 8 - 23 mg/dL 17 18 19   Creatinine 0.61 - 1.24 mg/dL 8.11 0.31 5.94  Sodium 135 - 145 mmol/L 137 136 133(L)  Potassium 3.5 - 5.1 mmol/L 3.5 3.3(L) 4.0  Chloride 98 - 111 mmol/L 105 103 106  CO2 22 - 32 mmol/L 24 24 15(L)  Calcium 8.9 - 10.3 mg/dL 7.5(L) 7.2(L) 7.5(L)  Total Protein 6.5 - 8.1 g/dL - 4.9(L) 4.9(L)  Total Bilirubin 0.3 - 1.2 mg/dL - 4.0(H) 2.6(H)  Alkaline Phos 38 - 126 U/L - 83 80  AST 15 - 41 U/L - 126(H) 132(H)  ALT 0 - 44 U/L - 54(H) 57(H)   CBC Latest Ref Rng & Units 05/04/2019 05/03/2019 05/03/2019  WBC 4.0 - 10.5 K/uL 24.7(H) 18.9(H) 13.5(H)  Hemoglobin 13.0 - 17.0 g/dL 5.8(P) 6.9(L) 7.9(L)  Hematocrit 39.0 - 52.0 % 25.3(L) 21.7(L) 23.8(L)  Platelets 150 - 400 K/uL 105(L) 113(L)  121(L)    RADS: CLINICAL DATA:  Intubation.  EXAM: PORTABLE CHEST 1 VIEW  COMPARISON:  Radiograph May 04, 2019.  FINDINGS: The heart size and mediastinal contours are within normal limits. Endotracheal and nasogastric tubes are unchanged in position. Left internal jugular catheter is unchanged. Stable bilateral diffuse lung opacities are noted most consistent with pneumonia. No pneumothorax or definite pleural effusion is noted. The visualized skeletal structures are unremarkable.  IMPRESSION: Stable support apparatus. Stable bilateral lung opacities are noted most consistent with pneumonia.   Electronically Signed   By: Lupita Raider M.D.   On: 05/04/2019 14:18  Assessment:   S/p ex-lap graham patch repair for perforated gastric ulcer.  Extubated but reintubated due to increased respiratory work, aspiration and eventual failure per report.  Continue aggressive ICU support per ICU team.  Hemoglobin with appropriate increase status post 1 unit transfusion.  Even though the INR is 2.2 overall the drain quality and amount remained stable.  Discussed case with ICU provider and will continue to monitor with serial hemoglobin checks and clinical exams.  No need to address the increased INR unless there is signs of rebleeding.  No need for surgical intervention at this time.

## 2019-05-04 NOTE — Progress Notes (Signed)
Assisted tele visit to patient with daughter.  Brandy Kabat Parker, RN, BSN,CCRN 

## 2019-05-04 NOTE — Progress Notes (Signed)
PCCM PROGRESS NOTE   PT PROFILE/INITIAL PRESENTATION: 73 y.o. male admitted to ICU from the OR after exploratory laparotomy and repair of perforated pyloric ulcer.  Patient has history of alcohol abuse and intraoperative findings consistent with cirrhosis and significant ascites.  He is intubated postoperatively. PMH of CAD, Htn.  MAJOR EVENTS/TEST RESULTS: 05/06 CT abd/pelvis: Free intraperitoneal air. There are 2 areas of abnormal bowel, within the 1st and 2nd portions of the duodenum where there is wall thickening and indistinctness concerning for peptic ulcer disease. Appearance of the liver is suspicious for cirrhosis. Moderate to large volume ascites 05/06 echocardiogram: LVEF 30-35%.  Severe hypokinesis of apical and periapical regions. 05/06 Admitted as above post op ex laparotomy with finding of perforated pyloric ulcer 05/07 Palliative Care consultation 05/08 Passed SBT and extubated.  Tolerated well initially.  Later in day, poor cough with excessive airway secretions.  Required reintubation.  Post intubation CXR consistent with aspiration (severe bilateral lower lobe airspace disease) 05/09   INDWELLING DEVICES: L radial A-line 05/06 >> 05/08 ETT 05/06 >> 05/08, 05/08 (reintubated) >>  L IJ CVL 05/06 >>     MICRO DATA: SARS-CoV-2 5/5 >> NEG MRSA PCR 05/05 >> NEG Blood 05/05 >> NEG Resp culture 05/09 >>   ANTIMICROBIALS:  Meropenem 05/06 >>  Fluconazole05/06 >>   SUBJ:  Sedated, intubated.  RASS -3, -4.  Synchronous with ventilator   Vitals:   05/04/19 0800 05/04/19 0830 05/04/19 0900 05/04/19 0930  BP: (!) 106/58 (!) 85/55 109/64 95/62  Pulse: (!) 105 (!) 105 (!) 125 (!) 121  Resp: (!) 21 (!) 21 (!) 22 (!) 23  Temp: (!) 96.6 F (35.9 C)     TempSrc: Axillary     SpO2: 95% 94% (!) 88% 95%  Weight:      Height:        Vent Mode: PRVC FiO2 (%):  [65 %-75 %] 65 % Set Rate:  [15 bmp] 15 bmp Vt Set:  [450 mL] 450 mL PEEP:  [5 cmH20] 5 cmH20 Plateau Pressure:   [18 cmH20-27 cmH20] 27 cmH20   05/08 0701 - 05/09 0700 In: 4609.3 [I.V.:3093.6; Blood:460; NG/GT:10; IV Piggyback:1045.8] Out: 5355 [Urine:2430; Emesis/NG output:175; Drains:2750]   Intake/Output Summary (Last 24 hours) at 05/04/2019 1335 Last data filed at 05/04/2019 1127 Gross per 24 hour  Intake 3714.92 ml  Output 4455 ml  Net -740.08 ml     EXAM:  Gen: Sedated, intubated.  RASS -3, -4.  Synchronous with ventilator HEENT: NCAT, + sclericterus Neck: No JVD Lungs: Bilateral rhonchi Cardiovascular: Regular, no M Abdomen: Mildly distended, mildly tender, diminished BS Ext: Extensive ecchymoses on both upper ext, symmetric edema of all 4 ext Neuro: CNs intact, no focal deficits  DATA:   BMP Latest Ref Rng & Units 05/04/2019 05/04/2019 05/03/2019  Glucose 70 - 99 mg/dL 136(H) 159(H) 89  BUN 8 - 23 mg/dL _0 Creatinine 0.61 - 1.24 mg/dL 0.87 0.92 0.99  Sodium 135 - 145 mmol/L 137 136 133(L)  Potassium 3.5 - 5.1 mmol/L 3.5 3.3(L) 4.0  Chloride 98 - 111 mmol/L 105 103 106  CO2 22 - 32 mmol/L 24 24 15(L)  Calcium 8.9 - 10.3 mg/dL 7.5(L) 7.2(L) 7.5(L)    CBC Latest Ref Rng & Units 05/04/2019 05/03/2019 05/03/2019  WBC 4.0 - 10.5 K/uL 24.7(H) 18.9(H) 13.5(H)  Hemoglobin 13.0 - 17.0 g/dL 8.5(L) 6.9(L) 7.9(L)  Hematocrit 39.0 - 52.0 % 25.3(L) 21.7(L) 23.8(L)  Platelets 150 - 400 K/uL 105(L) 113(L) 121(L)  Hepatic Function Latest Ref Rng & Units 05/04/2019 05/03/2019 05/03/2019  Total Protein 6.5 - 8.1 g/dL 4.9(L) 4.9(L) 4.3(L)  Albumin 3.5 - 5.0 g/dL 3.7 3.4(L) 2.6(L)  AST 15 - 41 U/L 126(H) 132(H) 147(H)  ALT 0 - 44 U/L 54(H) 57(H) 74(H)  Alk Phosphatase 38 - 126 U/L 83 80 90  Total Bilirubin 0.3 - 1.2 mg/dL 4.0(H) 2.6(H) 2.7(H)     Cardiac Panel (last 3 results) Recent Labs    05/04/19 0410  TROPONINI 0.29*    BNP (last 3 results) Recent Labs    05/04/19 0410  BNP 2,029.0*    ProBNP (last 3 results) No results for input(s): PROBNP in the last 8760 hours.    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% 4) persistent hypotension/shock - severe sepsis/relative hypovolemia 5) AKI, resolving 6) mild hyponatremia , resolved 7) alcoholic cirrhosis with ascites 8) severe sepsis/septic shock due to peritonitis 9) ICU acquired anemia without overt bleeding 10) mild thrombocytopenia 11) acute encephalopathy 12) history of heavy alcohol abuse   PLAN:  Cont full vent support - settings reviewed and/or adjusted Cont vent bundle Daily SBT if/when meets criteria Continue norepinephrine to maintain MAP > 65 mmHg Cont to volume resuscitate with albumin Continue LR infusion at reduced rate (50 cc/hr) Monitor temp, WBC count Micro and abx as above DVT px: SCDs Monitor CBC intermittently Transfuse per usual guidelines Postop management and decisions regarding nutrition per General Surgery  Minimize sedating medications Monitor for signs of alcohol withdrawal  Attempted to reach wife to provide update.  She was not available by phone.  CCM time: 40 mins The above time includes time spent in consultation with patient and/or family members and reviewing care plan on multidisciplinary rounds  David Simonds, MD PCCM service Mobile [phone] Pager [phone] 05/04/2019 1:35 PM   

## 2019-05-04 NOTE — Progress Notes (Signed)
Darren [name]  Darren [name] is a 73 y.o. male admitted on 05/06/2019 with abdominal pain. Patient with perforated viscous on CT and is s/p exploratory laparotomy and repair of perforation. Patient with significant history of alcohol abuse. Patient with penicillin allergy, with reaction of hives. Darren has been consulted for fluconazole dosing.  Plan: Fluconazole 400mg  IV Q24hr.   Meropenem 1g IV Q8hr.   Height: 6' (182.9 cm) Weight: 192 lb 10.9 oz (87.4 kg) IBW/kg (Calculated) : 77.6  Temp (24hrs), Avg:97.9 F (36.6 C), Min:96.6 F (35.9 C), Max:98.5 F (36.9 C)  Recent Labs  Lab 04/29/2019 1119 05/02/2019 1120 05/24/2019 1438 05/02/19 0322 05/02/19 1510 05/03/19 0455 05/03/19 2057 05/04/19 0410  WBC  --  13.1*  --  17.1*  --  13.5* 18.9* 24.7*  CREATININE  --  1.41*  --  1.21  --  1.00 0.99 0.92  LATICACIDVEN 3.8*  --  3.1*  --  1.3  --   --   --     Estimated Creatinine Clearance: 82 mL/min (by C-G formula based on SCr of 0.92 mg/dL).    Allergies  Allergen Reactions  . Penicillins Hives    Antimicrobials this admission: Ciprofloxacin 5/5 x 1 Metronidazole 5/6 x 1 Meropenem 5/6 >>  Fluconazole 5/6 >>   Dose adjustments this admission: N/A  Microbiology results: 5/6 BCx: no growth x 2 days  5/6 MRSA PCR: negative  5/6 COVID19: negative   Thank you for allowing Darren to be a part of this patient's care.  Darren [name] A 05/04/2019 10:32 AM

## 2019-05-04 NOTE — Progress Notes (Signed)
Pharmacy Electrolyte Monitoring Consult:  Pharmacy consulted to assist in monitoring and replacing electrolytes in this 73 y.o. male admitted on 05/02/2019 with Abdominal Pain   Labs:  Sodium (mmol/L)  Date Value  05/04/2019 136  08/28/2012 139   Potassium (mmol/L)  Date Value  05/04/2019 3.3 (L)  08/28/2012 3.6   Magnesium (mg/dL)  Date Value  37/16/9678 2.2   Phosphorus (mg/dL)  Date Value  93/81/0175 3.3   Calcium (mg/dL)  Date Value  10/19/8526 7.2 (L)   Calcium, Total (mg/dL)  Date Value  78/24/2353 8.9   Albumin (g/dL)  Date Value  61/44/3154 3.7  07/12/2012 3.8   Corrected calcium:  8.2  Assessment/Plan: Will replace K w/ KCI 10 mEq IV x 5 for a total of 0.5 mEq increase in K ~ will recheck electrolytes post K run infusions @ 1200.  Will replace for goal potassium ~ 4 and goal magnesium ~ 2.   Pharmacy will continue to monitor and adjust per consult.   Darren [name] 05/04/2019 5:31 AM

## 2019-05-04 NOTE — Progress Notes (Signed)
   05/04/19 2100  Clinical Encounter Type  Visited With Patient  Visit Type Initial  Spiritual Encounters  Spiritual Needs Prayer

## 2019-05-04 NOTE — Progress Notes (Signed)
S: Called to evaluate patient for worsening hypoxia and decrease consciousness.  Briefly, this is a 73 year old male who was admitted with a perforated viscus and taken for an exploratory laparoscopy and found to have a perforated pyloric ulcer.  He was kept intubated postoperatively and extubated earlier today.  Post extubation, patient remained somnolent and unable to control his secretions.  He has a history of alcoholic cirrhosis with ascites and a large amount of ascites fluid was drained during this admission.  He is currently on a low-dose of Precedex for agitation.  He has not received any narcotics post extubation.  Despite being transitioned to high flow nasal cannula with aggressive nasotracheal suctioning, patient remains barely responsive with copious amounts of secretions.  A stat ABG revealed H of 7.17, PCO2 of 40, and bicarb of 14.  When his declining mentation and inability to maintain his airway, the decision was made to emergently intubate patient.  Was given 2 A of bicarb prior to intubation  O Blood pressure 95/63, pulse (!) 102, temperature 97.9 F (36.6 C), temperature source Oral, resp. rate 18, height 6' (1.829 m), weight 87.4 kg, SpO2 94 %. Gen: Obtunded, in acute respiratory distress HEENT: PERRLA, trachea midline, significant drooling and pooling of secretions in oral cavity Respiratory: Increased work of breathing, diffuse rhonchi and crackles in all lung fields and diminished breath sounds in all lung fields Cardiovascular: Apical pulse tachycardic, regular, S1-S2, no murmur regurg or gallop, +3 pitting edema bilaterally Neuro: Withdraws and opens eyes to noxious stimulus, unable to follow commands Skin: Cyanotic, cool to touch  Assessment Acute hypoxic respiratory failure-failed extubation Aspiration into airway Acute CHF exacerbation Refractory septic shock due to peritonitis Sinus tachycardia with heart rate in the 150s Hypokalemia Elevated LFTs Elevated  troponin Acute blood loss anemia Severe metabolic encephalopathy  PLAN Patient successfully reintubated Chest x-ray and ABG post intubation reviewed, vent changes made with improvement in ABG and oxygenation VAP protocol Weaning trials as tolerated Nebulized bronchodilators Transfuse 1 unit of packed red blood cells IV diuresis Pressors to maintain mean arterial blood pressure greater than 65 Hold dobutamine in light of elevated heart rate in the 150s Strict I's and O's Continue meropenem Trend procalcitonin Sputum culture  General surgery notified and wife updated   S. Tukov-Yual ANP-BC Pulmonary and Critical Care Medicine [facility] Pager [phone] or [phone]  NB: This document was prepared using Dragon voice recognition software and may include unintentional dictation errors.

## 2019-05-04 NOTE — Progress Notes (Addendum)
Pharmacy Electrolyte Monitoring Consult:  Pharmacy consulted to assist in monitoring and replacing electrolytes in this 73 y.o. male admitted on 05/23/2019 with Abdominal Pain   Labs:  Sodium (mmol/L)  Date Value  05/04/2019 137  08/28/2012 139   Potassium (mmol/L)  Date Value  05/04/2019 3.5  08/28/2012 3.6   Magnesium (mg/dL)  Date Value  49/20/1007 2.2   Phosphorus (mg/dL)  Date Value  12/14/7587 3.3   Calcium (mg/dL)  Date Value  32/54/9826 7.5 (L)   Calcium, Total (mg/dL)  Date Value  41/58/3094 8.9   Albumin (g/dL)  Date Value  07/68/0881 3.7  07/12/2012 3.8   Corrected calcium:  8.2  Assessment/Plan: 5/9 at 1122:   K 3.5.  Scr 0.87 No supplementation at this time. F/u labs in am. Will replace for goal potassium ~ 4 and goal magnesium ~ 2.  Pharmacy will continue to monitor and adjust per consult.   Addendum:  Pharmacy electrolyte consult has been discontinued by MD  Dyrell Tuccillo A 05/04/2019 1:12 PM

## 2019-05-05 ENCOUNTER — Inpatient Hospital Stay: Payer: Medicare HMO

## 2019-05-05 LAB — MAGNESIUM: Magnesium: 2.1 mg/dL (ref 1.7–2.4)

## 2019-05-05 LAB — CBC
HCT: 25.5 % — ABNORMAL LOW (ref 39.0–52.0)
Hemoglobin: 8.7 g/dL — ABNORMAL LOW (ref 13.0–17.0)
MCH: 33.1 pg (ref 26.0–34.0)
MCHC: 34.1 g/dL (ref 30.0–36.0)
MCV: 97 fL (ref 80.0–100.0)
Platelets: 103 10*3/uL — ABNORMAL LOW (ref 150–400)
RBC: 2.63 MIL/uL — ABNORMAL LOW (ref 4.22–5.81)
RDW: 17.8 % — ABNORMAL HIGH (ref 11.5–15.5)
WBC: 21.9 10*3/uL — ABNORMAL HIGH (ref 4.0–10.5)
nRBC: 0.2 % (ref 0.0–0.2)

## 2019-05-05 LAB — COMPREHENSIVE METABOLIC PANEL
ALT: 51 U/L — ABNORMAL HIGH (ref 0–44)
AST: 112 U/L — ABNORMAL HIGH (ref 15–41)
Albumin: 3.4 g/dL — ABNORMAL LOW (ref 3.5–5.0)
Alkaline Phosphatase: 87 U/L (ref 38–126)
Anion gap: 7 (ref 5–15)
BUN: 17 mg/dL (ref 8–23)
CO2: 25 mmol/L (ref 22–32)
Calcium: 7.7 mg/dL — ABNORMAL LOW (ref 8.9–10.3)
Chloride: 105 mmol/L (ref 98–111)
Creatinine, Ser: 0.77 mg/dL (ref 0.61–1.24)
GFR calc Af Amer: >60 mL/min (ref >60)
GFR calc non Af Amer: >60 mL/min (ref >60)
Glucose, Bld: 153 mg/dL — ABNORMAL HIGH (ref 70–99)
Potassium: 3.5 mmol/L (ref 3.5–5.1)
Sodium: 137 mmol/L (ref 135–145)
Total Bilirubin: 4.7 mg/dL — ABNORMAL HIGH (ref 0.3–1.2)
Total Protein: 4.6 g/dL — ABNORMAL LOW (ref 6.5–8.1)

## 2019-05-05 LAB — TROPONIN I: Troponin I: 0.31 ng/mL (ref <0.03)

## 2019-05-05 LAB — PHOSPHORUS: Phosphorus: 2.5 mg/dL (ref 2.5–4.6)

## 2019-05-05 MED ORDER — HYDROCORTISONE NA SUCCINATE PF 100 MG IJ SOLR
25.0000 mg | Freq: Three times a day (TID) | INTRAMUSCULAR | Status: DC
  Administered 2019-05-05 – 2019-05-06 (×4): 25 mg via INTRAVENOUS
  Filled 2019-05-05 (×4): qty 2

## 2019-05-05 MED ORDER — ALBUMIN HUMAN 25 % IV SOLN
25.0000 g | Freq: Two times a day (BID) | INTRAVENOUS | Status: AC
  Administered 2019-05-05 (×2): 25 g via INTRAVENOUS
  Filled 2019-05-05 (×2): qty 100

## 2019-05-05 NOTE — Progress Notes (Signed)
PCCM PROGRESS NOTE   PT PROFILE/INITIAL PRESENTATION: 73 y.o. male admitted to ICU from the OR after exploratory laparotomy and repair of perforated pyloric ulcer.  Patient has history of alcohol abuse and intraoperative findings consistent with cirrhosis and significant ascites.  He remained intubated postoperatively. PMH of CAD, Htn.  MAJOR EVENTS/TEST RESULTS: 05/06 CT abd/pelvis: Free intraperitoneal air. There are 2 areas of abnormal bowel, within the 1st and 2nd portions of the duodenum where there is wall thickening and indistinctness concerning for peptic ulcer disease. Appearance of the liver is suspicious for cirrhosis. Moderate to large volume ascites 05/06 echocardiogram: LVEF 30-35%.  Severe hypokinesis of apical and periapical regions. 05/06 Admitted as above post op ex laparotomy with finding of perforated pyloric ulcer 05/07 Palliative Care consultation 05/08 Passed SBT and extubated.  Tolerated well initially.  Later in day, poor cough with excessive airway secretions.  Required reintubation.  Post intubation CXR consistent with aspiration (severe bilateral lower lobe airspace disease) 05/09 Full vent support with FiO2 65%.  Requiring vasopressors.  05/10 gas exchange improving.  Tolerates PS 10 cm H2O.  Vasopressor requirement improving.  INDWELLING DEVICES: L radial A-line 05/06 >> 05/08 ETT 05/06 >> 05/08, 05/08 (reintubated) >>  L IJ CVL 05/06 >>    MICRO DATA: SARS-CoV-2 5/5 >> NEG MRSA PCR 05/05 >> NEG Blood 05/05 >> NEG Resp culture 05/09 >>   ANTIMICROBIALS:  Meropenem 05/06 >>  Fluconazole05/06 >>   SUBJ:  Remains sedated, intubated.  RASS -3.  Outstripping ventilator flow.  Better patient-ventilator synchrony and PSV mode.   Vitals:   05/05/19 0900 05/05/19 0938 05/05/19 1000 05/05/19 1100  BP: 101/62 99/60 (!) 97/57 (!) 91/55  Pulse: (!) 101  (!) 112 96  Resp: 16  16 14   Temp:      TempSrc:      SpO2: 96%  97% 98%  Weight:      Height:         Vent Mode: PSV FiO2 (%):  [50 %-65 %] 50 % Set Rate:  [15 bmp] 15 bmp Vt Set:  [450 mL] 450 mL PEEP:  [5 cmH20] 5 cmH20 Pressure Support:  [10 cmH20] 10 cmH20 Plateau Pressure:  [18 cmH20-20 cmH20] 20 cmH20   05/09 0701 - 05/10 0700 In: 1499.3 [I.V.:739.3; IV Piggyback:760] Out: 2815 [Urine:1315; Emesis/NG output:100; Drains:1400]   Intake/Output Summary (Last 24 hours) at 05/05/2019 1229 Last data filed at 05/05/2019 1152 Gross per 24 hour  Intake 988.67 ml  Output 2735 ml  Net -1746.33 ml     EXAM:  Gen: Sedated, intubated.  RASS -3.  Synchronize his with ventilator better and PSV mode HEENT: NCAT, + sclericterus Neck: No JVD Lungs: Bilateral rhonchi Cardiovascular: Regular, no M Abdomen: Mildly distended, mildly tender, diminished BS Ext: Extensive ecchymoses on both upper ext, symmetric edema of all 4 ext Neuro: CNs intact, no focal deficits  DATA:   BMP Latest Ref Rng & Units 05/05/2019 05/04/2019 05/04/2019  Glucose 70 - 99 mg/dL 153(H) 136(H) 159(H)  BUN 8 - 23 mg/dL 17 17 18   Creatinine 0.61 - 1.24 mg/dL 0.77 0.87 0.92  Sodium 135 - 145 mmol/L 137 137 136  Potassium 3.5 - 5.1 mmol/L 3.5 3.5 3.3(L)  Chloride 98 - 111 mmol/L 105 105 103  CO2 22 - 32 mmol/L 25 24 24   Calcium 8.9 - 10.3 mg/dL 7.7(L) 7.5(L) 7.2(L)    CBC Latest Ref Rng & Units 05/05/2019 05/04/2019 05/03/2019  WBC 4.0 - 10.5 K/uL 21.9(H) 24.7(H) 18.9(H)  Hemoglobin 13.0 - 17.0 g/dL 8.7(L) 8.5(L) 6.9(L)  Hematocrit 39.0 - 52.0 % 25.5(L) 25.3(L) 21.7(L)  Platelets 150 - 400 K/uL 103(L) 105(L) 113(L)    Hepatic Function Latest Ref Rng & Units 05/05/2019 05/04/2019 05/03/2019  Total Protein 6.5 - 8.1 g/dL 4.6(L) 4.9(L) 4.9(L)  Albumin 3.5 - 5.0 g/dL 3.4(L) 3.7 3.4(L)  AST 15 - 41 U/L 112(H) 126(H) 132(H)  ALT 0 - 44 U/L 51(H) 54(H) 57(H)  Alk Phosphatase 38 - 126 U/L 87 83 80  Total Bilirubin 0.3 - 1.2 mg/dL 4.7(H) 4.0(H) 2.6(H)     Cardiac Panel (last 3 results) Recent Labs    05/04/19 0410  05/05/19 0329  TROPONINI 0.29* 0.31*    BNP (last 3 results) Recent Labs    05/04/19 0410  BNP 2,029.0*    ProBNP (last 3 results) No results for input(s): PROBNP in the last 8760 hours.    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 BNP > 2000 05/09 4) hypotension/shock due to severe sepsis/relative hypovolemia.  Improving 5) AKI, resolved 6) mild hyponatremia , resolved 7) alcoholic cirrhosis with ascites.  Rising bilirubin 8) severe sepsis/septic shock due to peritonitis and aspiration PNA 9) ICU acquired anemia without overt bleeding 10) mild thrombocytopenia 11) acute encephalopathy, controlled on propofol 12) history of heavy alcohol abuse   PLAN:  Cont vent support - settings reviewed and/or adjusted  PSV mode as tolerated Cont vent bundle Daily SBT if/when meets criteria Continue norepinephrine to maintain MAP > 65 mmHg Cont to volume resuscitate with albumin - 2 doses ordered 05/10 DC LR infusion 05/10 Monitor temp, WBC count Micro and abx as above DVT px: SCDs Monitor CBC intermittently Transfuse per usual guidelines Per Gen Surg, plan EGD 05/11 to look for gastric leak, then consider enteral nutrition  PAD protocol - propofol and PRN fentanyl Monitor for signs of alcohol withdrawal  Wife updated in detail over phone.  CCM time: 35 mins The above time includes time spent in consultation with patient and/or family members and reviewing care plan on multidisciplinary rounds  Merton Border, MD PCCM service Mobile  [phone] Pager [phone] 05/05/2019 12:29 PM

## 2019-05-05 NOTE — Progress Notes (Signed)
Subjective:  CC: Darren [name] is a 73 y.o. male  Hospital stay day 5, 4 Days Post-Op ex-lap, graham patch repair  HPI: No acute issues reported overnight.  Remains intubated.  Slowly weaning off pressors.  ROS:  Unable to obtain secondary to patient status.  Objective:   Temp:  [97.8 F (36.6 C)-98 F (36.7 C)] 97.9 F (36.6 C) (05/10 0800) Pulse Rate:  [93-130] 96 (05/10 1100) Resp:  [14-25] 14 (05/10 1100) BP: (76-117)/(50-76) 91/55 (05/10 1100) SpO2:  [88 %-98 %] 98 % (05/10 1100) FiO2 (%):  [50 %-65 %] 50 % (05/10 1213)     Height: 6' (182.9 cm) Weight: 87.4 kg BMI (Calculated): 26.13   Intake/Output this shift:   Intake/Output Summary (Last 24 hours) at 05/05/2019 1318 Last data filed at 05/05/2019 1152 Gross per 24 hour  Intake 988.67 ml  Output 2735 ml  Net -1746.33 ml    Constitutional :  vented, sedated  Gastrointestinal: soft, no involuntary guarding, staples c/d/i.  drain with serosanguinous output, no drastic change in amount or quality since last exam.   Skin: Cool and moist.   Psychiatric: Normal affect, non-agitated, not confused       LABS:  CMP Latest Ref Rng & Units 05/05/2019 05/04/2019 05/04/2019  Glucose 70 - 99 mg/dL 878(M) 767(M) 094(B)  BUN 8 - 23 mg/dL 17 17 18   Creatinine 0.61 - 1.24 mg/dL 0.96 2.83 6.62  Sodium 135 - 145 mmol/L 137 137 136  Potassium 3.5 - 5.1 mmol/L 3.5 3.5 3.3(L)  Chloride 98 - 111 mmol/L 105 105 103  CO2 22 - 32 mmol/L 25 24 24   Calcium 8.9 - 10.3 mg/dL 7.7(L) 7.5(L) 7.2(L)  Total Protein 6.5 - 8.1 g/dL 4.6(L) - 4.9(L)  Total Bilirubin 0.3 - 1.2 mg/dL 4.7(H) - 4.0(H)  Alkaline Phos 38 - 126 U/L 87 - 83  AST 15 - 41 U/L 112(H) - 126(H)  ALT 0 - 44 U/L 51(H) - 54(H)   CBC Latest Ref Rng & Units 05/05/2019 05/04/2019 05/03/2019  WBC 4.0 - 10.5 K/uL 21.9(H) 24.7(H) 18.9(H)  Hemoglobin 13.0 - 17.0 g/dL 9.4(T) 6.5(Y) 6.9(L)  Hematocrit 39.0 - 52.0 % 25.5(L) 25.3(L) 21.7(L)  Platelets 150 - 400 K/uL 103(L) 105(L) 113(L)     RADS: CLINICAL DATA:  RESP FAILURE  EXAM: PORTABLE CHEST 1 VIEW  COMPARISON:  None.  FINDINGS: Endotracheal tube and central venous line unchanged. NG tube is in the stomach. Stable cardiac silhouette.  Dense bilateral diffuse airspace disease.  No pneumothorax.  IMPRESSION: 1. Stable support apparatus. 2. No interval change. 3. Dense diffuse bilateral airspace disease.   Electronically Signed   By: Genevive Bi M.D.   On: 05/05/2019 05:56 CLINICAL DATA:  RESP FAILURE  EXAM: PORTABLE CHEST 1 VIEW  COMPARISON:  None.  FINDINGS: Endotracheal tube and central venous line unchanged. NG tube is in the stomach. Stable cardiac silhouette.  Dense bilateral diffuse airspace disease.  No pneumothorax.  IMPRESSION: 1. Stable support apparatus. 2. No interval change. 3. Dense diffuse bilateral airspace disease.   Electronically Signed   By: Genevive Bi M.D.   On: 05/05/2019 05:56  Assessment:   S/p ex-lap graham patch repair for perforated gastric ulcer.  Extubated but reintubated due to increased respiratory work, aspiration and eventual failure per report.  Continue aggressive ICU support per ICU team.  Hemoglobin stable after 24 hours. Even though the INR is 2.2 on last check, no need for immediate intervention at this time.  As long as  there is no acute worsening of current condition, we can proceed with an upper GI study tomorrow to determine if there is any persistent leak from the repair.  If there is no leak noted, can consider resuming enteral feeding via tube.

## 2019-05-05 NOTE — Progress Notes (Signed)
Pharmacy Antibiotic Note  Darren [name] is a 73 y.o. male admitted on 05-26-2019 with abdominal pain. Patient with perforated viscous on CT and is s/p exploratory laparotomy and repair of perforation. Patient with significant history of alcohol abuse. Patient with penicillin allergy, with reaction of hives. Pharmacy has been consulted for fluconazole dosing.  Plan: Fluconazole 400mg  IV Q24hr.   Meropenem 1g IV Q8hr.   Height: 6' (182.9 cm) Weight: 192 lb 10.9 oz (87.4 kg) IBW/kg (Calculated) : 77.6  Temp (24hrs), Avg:97.8 F (36.6 C), Min:97.5 F (36.4 C), Max:98 F (36.7 C)  Recent Labs  Lab 05/25/2019 1119  05/22/2019 1438 05/02/19 0322 05/02/19 1510 05/03/19 0455 05/03/19 2057 05/04/19 0410 05/04/19 1122 05/05/19 0329  WBC  --    < >  --  17.1*  --  13.5* 18.9* 24.7*  --  21.9*  CREATININE  --    < >  --  1.21  --  1.00 0.99 0.92 0.87 0.77  LATICACIDVEN 3.8*  --  3.1*  --  1.3  --   --   --   --   --    < > = values in this interval not displayed.    Estimated Creatinine Clearance: 94.3 mL/min (by C-G formula based on SCr of 0.77 mg/dL).    Allergies  Allergen Reactions  . Penicillins Hives    Antimicrobials this admission: Ciprofloxacin 5/5 x 1 Metronidazole 5/6 x 1 Meropenem 5/6 >>  Fluconazole 5/6 >>   Dose adjustments this admission: N/A  Microbiology results: 5/6 BCx: no growth x 4 days  5/6 MRSA PCR: negative  5/6 COVID19: negative  5/9 trach aspirate NGTD  Thank you for allowing pharmacy to be a part of this patient's care.  Seema Blum A 05/05/2019 10:33 AM

## 2019-05-05 NOTE — Progress Notes (Signed)
Assisted tele visit to patient with son.  Darren [name], Micki Riley, RN,BSN,CCRN

## 2019-05-05 NOTE — Plan of Care (Signed)

## 2019-05-06 ENCOUNTER — Inpatient Hospital Stay: Payer: Medicare HMO

## 2019-05-06 LAB — CULTURE, BLOOD (ROUTINE X 2)
Culture: NO GROWTH
Culture: NO GROWTH
Special Requests: ADEQUATE

## 2019-05-06 LAB — CBC
HCT: 25.1 % — ABNORMAL LOW (ref 39.0–52.0)
Hemoglobin: 8.4 g/dL — ABNORMAL LOW (ref 13.0–17.0)
MCH: 32.8 pg (ref 26.0–34.0)
MCHC: 33.5 g/dL (ref 30.0–36.0)
MCV: 98 fL (ref 80.0–100.0)
Platelets: 91 10*3/uL — ABNORMAL LOW (ref 150–400)
RBC: 2.56 MIL/uL — ABNORMAL LOW (ref 4.22–5.81)
RDW: 17.4 % — ABNORMAL HIGH (ref 11.5–15.5)
WBC: 15.4 10*3/uL — ABNORMAL HIGH (ref 4.0–10.5)
nRBC: 0.4 % — ABNORMAL HIGH (ref 0.0–0.2)

## 2019-05-06 LAB — COMPREHENSIVE METABOLIC PANEL
ALT: 48 U/L — ABNORMAL HIGH (ref 0–44)
AST: 101 U/L — ABNORMAL HIGH (ref 15–41)
Albumin: 3.8 g/dL (ref 3.5–5.0)
Alkaline Phosphatase: 91 U/L (ref 38–126)
Anion gap: 9 (ref 5–15)
BUN: 18 mg/dL (ref 8–23)
CO2: 25 mmol/L (ref 22–32)
Calcium: 8.2 mg/dL — ABNORMAL LOW (ref 8.9–10.3)
Chloride: 107 mmol/L (ref 98–111)
Creatinine, Ser: 0.73 mg/dL (ref 0.61–1.24)
GFR calc Af Amer: >60 mL/min (ref >60)
GFR calc non Af Amer: >60 mL/min (ref >60)
Glucose, Bld: 126 mg/dL — ABNORMAL HIGH (ref 70–99)
Potassium: 3.7 mmol/L (ref 3.5–5.1)
Sodium: 141 mmol/L (ref 135–145)
Total Bilirubin: 4.9 mg/dL — ABNORMAL HIGH (ref 0.3–1.2)
Total Protein: 5.1 g/dL — ABNORMAL LOW (ref 6.5–8.1)

## 2019-05-06 LAB — PHOSPHORUS: Phosphorus: 1.5 mg/dL — ABNORMAL LOW (ref 2.5–4.6)

## 2019-05-06 LAB — MAGNESIUM: Magnesium: 1.9 mg/dL (ref 1.7–2.4)

## 2019-05-06 LAB — BRAIN NATRIURETIC PEPTIDE: B Natriuretic Peptide: 3964 pg/mL — ABNORMAL HIGH (ref 0.0–100.0)

## 2019-05-06 MED ORDER — PIVOT 1.5 CAL PO LIQD
1000.0000 mL | ORAL | Status: DC
  Administered 2019-05-06: 1000 mL
  Filled 2019-05-06: qty 1000

## 2019-05-06 MED ORDER — HYDROCORTISONE NA SUCCINATE PF 100 MG IJ SOLR
25.0000 mg | Freq: Two times a day (BID) | INTRAMUSCULAR | Status: DC
  Administered 2019-05-06 – 2019-05-07 (×2): 25 mg via INTRAVENOUS
  Filled 2019-05-06 (×2): qty 2

## 2019-05-06 MED ORDER — VITAL HIGH PROTEIN PO LIQD: 1000.0000 mL | ORAL | Status: DC

## 2019-05-06 MED ORDER — PRO-STAT SUGAR FREE PO LIQD: 30.0000 mL | Freq: Two times a day (BID) | ORAL | Status: DC

## 2019-05-06 MED ORDER — FREE WATER
100.0000 mL | Freq: Three times a day (TID) | Status: DC
  Administered 2019-05-06 – 2019-05-07 (×3): 100 mL

## 2019-05-06 NOTE — Progress Notes (Signed)
05/06/19:  UGI study done and personally reviewed.  There is no contrast leak from distal stomach where perforation was located intraop.  Will place Dietician consult to start tube feeds for enteral nutrition.   Henrene Dodge, MD

## 2019-05-06 NOTE — Progress Notes (Addendum)
Nutrition Follow-up  RD working remotely.  DOCUMENTATION CODES:   Not applicable  INTERVENTION:  Recommend initiating Pivot 1.5 Cal at 20 mL/hr.  If patient tolerates can begin advancing by 15 mL/hr every 8 hours to goal rate 50 mL/hr. Provides 1800 kcal, 113 grams of protein, 900 mL H2O daily.  Provide minimum free water flush of 30 mL Q4hrs to maintain tube patency.  Goal TF regimen meets 100% RDIs for vitamins/minerals.  Monitor magnesium, potassium, and phosphorus daily for at least 3 days, MD to replete as needed, as pt is at risk for refeeding syndrome.  NUTRITION DIAGNOSIS:   Inadequate oral intake related to acute illness as evidenced by NPO status.  Ongoing.  GOAL:   Provide needs based on ASPEN/SCCM guidelines  Progressing with initiation of nutrition support.  MONITOR:   Vent status, Labs, Weight trends, TF tolerance, Skin, I & O's  REASON FOR ASSESSMENT:   Ventilator    ASSESSMENT:   73 y/o male with h/o MI and heavy etoh abuse admitted with perforated ulcer near the pylorus (unable to discern definitively if distal stomach or proximal duodenum) now s/p ex lap with graham patch 5/6. Pt also found to have new liver cirrhosis with 2L ascites noted during surgery    -Patient was extubated on 5/8. Required re-intubation later that day. -Patient underwent upper GI series today. No evidence of contrast leak was found.  Patient intubated and sedated. On spontaneous mode with FiO2 40%, Pressure Support 15 cmH2O, and PEEP 5 cmH2O. Abdomen is distended per RN assessment. Last BM was on 5/5.  IV Access: left IJ CVC triple lumen placed 5/6; tip in mid to lower SVC per chest x-ray 5/6  Enteral Access: 18 Fr. NGT placed 5/6; still terminates in stomach per chest x-ray 5/10; 65 cm at right nare  MAP: 72-86 mmHg  Patient is currently intubated on ventilator support Ve: 10.9 L/min Temp (24hrs), Avg:98 F (36.7 C), Min:97.5 F (36.4 C), Max:98.5 F (36.9  C)  Propofol: currently off; was running at 6.29 mL/hr (166 kcal daily)  Medications reviewed and include: Solu-Cortef 25 mg Q8hrs IV, pantoprazole, thiamine 100 mg daily IV, Diflucan 400 mg daily IV, meropenem, norepinephrine gtt at 2 mcg/min, propofol gtt, folic acid 1 mg daily IV.  Labs reviewed: BNP 3964. Triglycerides 38 on 5/9.  I/O: 960 mL UOP yesterday (0.5 mL/kg/hr); 0 mL output from NGT; 1590 mL output from JP drain yesterday  Weight trend: 87.4 kg on 5/9; + 6.2 kg from 5/6  Plan discussed on rounds was to initiate tube feeds if there is no evidence of leak on upper GI series today. If unable to start tube feeds plan is to start TPN.  Diet Order:   Diet Order            Diet NPO time specified  Diet effective now             EDUCATION NEEDS:   No education needs have been identified at this time  Skin:  Skin Assessment: Skin Integrity Issues:(ecchymosis; closed incision to abdomen)  Last BM:  05/01/2019 per chart  Height:   Ht Readings from Last 1 Encounters:  05/18/2019 6' (1.829 m)   Weight:   Wt Readings from Last 1 Encounters:  05/04/19 87.4 kg   Ideal Body Weight:  80.9 kg  BMI:  Body mass index is 26.13 kg/m.  Estimated Nutritional Needs:   Kcal:  1839 (PSU 2003b w/ MSJ 1615, Ve 10.9, Tmax 36.9)  Protein:  105-120g/day   Fluid:  >2L/day   Helane Rima, MS, RD, LDN Office: [phone] Pager: [phone] After Hours/Weekend Pager: [phone]

## 2019-05-06 NOTE — Progress Notes (Signed)
05/06/2019  Subjective: Patient is 5 Days Post-Op s/p exlap, repair of perforated gastric ulcer with graham patch.  Patient remains intubated, though sedation cut off this morning to assess respiratory status.  Norepi weaning down slowly.  WBC improving with IV abx treatment for perforation and aspiration pneumonia.  Vital signs: Temp:  [97.5 F (36.4 C)-98.5 F (36.9 C)] 97.8 F (36.6 C) (05/11 0800) Pulse Rate:  [25-129] 114 (05/11 1000) Resp:  [14-29] 23 (05/11 1000) BP: (91-121)/(55-76) 105/66 (05/11 1000) SpO2:  [88 %-99 %] 96 % (05/11 1000) FiO2 (%):  [40 %-50 %] 40 % (05/11 0818)   Intake/Output: 05/10 0701 - 05/11 0700 In: 932.4 [I.V.:338.6; IV Piggyback:393.9] Out: 2550 [Urine:960; Drains:1590] Last BM Date: 19-May-2019  Physical Exam: Constitutional:  Sedated, intubated Cardiac:  Low grade sinus tachycardia Pulm:  On vent Abdomen:  Soft, non-distended, cannot assess for tenderness given sedation.  Incision clean, dry, intact with staples in place and no evidence of infection.  JP drain with serosanguinous fluid, high output.  Labs:  Recent Labs    05/05/19 0329 05/06/19 0414  WBC 21.9* 15.4*  HGB 8.7* 8.4*  HCT 25.5* 25.1*  PLT 103* 91*   Recent Labs    05/05/19 0329 05/06/19 0414  NA 137 141  K 3.5 3.7  CL 105 107  CO2 25 25  GLUCOSE 153* 126*  BUN 17 18  CREATININE 0.77 0.73  CALCIUM 7.7* 8.2*   Recent Labs    05/04/19 0410  LABPROT 23.7*  INR 2.2*    Imaging: Dg Chest Port 1 View  Result Date: 05/06/2019 CLINICAL DATA:  73 year old male with respiratory failure. Negative for COVID-19. On 2019-05-19. EXAM: PORTABLE CHEST 1 VIEW COMPARISON:  05/05/2019 and earlier. FINDINGS: Portable AP semi upright view at 0622 hours. Endotracheal tube tip in good position between the clavicles and carina. Enteric tube courses to the left upper quadrant, tip not included. Stable left IJ central line. Coarse and confluent bilateral patchy and interstitial pulmonary  opacity in both lungs. Ventilation has mildly improved since 05/04/2019. No pneumothorax or pleural effusion is evident. Normal cardiac size and mediastinal contours. Calcified aortic atherosclerosis. IMPRESSION: 1. Stable lines and tubes. 2. Continued diffuse bilateral pulmonary opacity, mildly improved ventilation since 05/03/18. Electronically Signed   By: Odessa Fleming M.D.   On: 05/06/2019 07:34    Assessment/Plan: This is a 73 y.o. male s/p exlap, repair of perforated gastric ulcer with graham patch.  --Appreciate ICU management.  Continue weaning pressor and vent as tolerated. --UGI contrast study scheduled for this morning to evaluate repair for any leak.  If no leak, then will be able to start him on tube feeds.  Otherwise would need TPN. --JP drain connected to larger collection device given the high volume output.  Continue for now and replace with albumin, 6-8 gm per liter of output. --Continue IV abx.   Howie Ill, MD [facility]

## 2019-05-06 NOTE — Progress Notes (Signed)
PCCM PROGRESS NOTE   PT PROFILE/INITIAL PRESENTATION: 73 y.o. male admitted to ICU from the OR after exploratory laparotomy and repair of perforated pyloric ulcer.  Patient has history of alcohol abuse and intraoperative findings consistent with cirrhosis and significant ascites.  He remained intubated postoperatively. PMH of CAD, Htn.  MAJOR EVENTS/TEST RESULTS: 05/06 CT abd/pelvis: Free intraperitoneal air. There are 2 areas of abnormal bowel, within the 1st and 2nd portions of the duodenum where there is wall thickening and indistinctness concerning for peptic ulcer disease. Appearance of the liver is suspicious for cirrhosis. Moderate to large volume ascites 05/06 echocardiogram: LVEF 30-35%.  Severe hypokinesis of apical and periapical regions. 05/06 Admitted as above post op ex laparotomy with finding of perforated pyloric ulcer 05/07 Palliative Care consultation 05/08 Passed SBT and extubated.  Tolerated well initially.  Later in day, poor cough with excessive airway secretions.  Required reintubation.  Post intubation CXR consistent with aspiration (severe bilateral lower lobe airspace disease) 05/09 Full vent support with FiO2 65%.  Requiring vasopressors.  05/10 gas exchange improving.  Tolerates PS 10 cm H2O.  Vasopressor requirement improving.  INDWELLING DEVICES: L radial A-line 05/06 >> 05/08 ETT 05/06 >> 05/08, 05/08 (reintubated) >>  L IJ CVL 05/06 >>    MICRO DATA: SARS-CoV-2 5/5 >> NEG MRSA PCR 05/05 >> NEG Blood 05/05 >> NEG Resp culture 05/09 >>   ANTIMICROBIALS:  Meropenem 05/06 >>  Fluconazole05/06 >>   SUBJ:  Remains sedated, intubated.  RASS -2. Synchronous with vent in PSV mode.   Vitals:   05/06/19 1200 05/06/19 1300 05/06/19 1400 05/06/19 1500  BP: 100/69 96/62 101/66   Pulse: (!) 105 (!) 106 (!) 102 (!) 106  Resp: (!) 23 (!) 22 (!) 24 (!) 24  Temp: 97.7 F (36.5 C)     TempSrc: Oral     SpO2: 96% 95% 92% 99%  Weight:      Height:        Vent  Mode: Spontaneous FiO2 (%):  [40 %-50 %] 40 % Set Rate:  [15 bmp] 15 bmp Vt Set:  [450 mL] 450 mL PEEP:  [5 cmH20] 5 cmH20 Pressure Support:  [10 cmH20-15 cmH20] 15 cmH20 Plateau Pressure:  [18 cmH20] 18 cmH20   05/10 0701 - 05/11 0700 In: 932.4 [I.V.:338.6; IV Piggyback:393.9] Out: 2550 [Urine:960; Drains:1590]   Intake/Output Summary (Last 24 hours) at 05/06/2019 1521 Last data filed at 05/06/2019 1500 Gross per 24 hour  Intake 743.41 ml  Output 2075 ml  Net -1331.59 ml     EXAM:  Gen: Sedated, intubated.  RASS -.  Synchronous  HEENT: NCAT, + sclericterus Neck: No JVD Lungs: Few bilateral rhonchi, No wheezes Cardiovascular: Regular, no M Abdomen: Mildly distended, mildly tender, diminished BS Ext: Extensive ecchymoses on both upper ext, symmetric edema of all 4 ext - slightly improved Neuro: CNs intact, no focal deficits  DATA:   BMP Latest Ref Rng & Units 05/06/2019 05/05/2019 05/04/2019  Glucose 70 - 99 mg/dL 126(H) 153(H) 136(H)  BUN 8 - 23 mg/dL 18 17 17   Creatinine 0.61 - 1.24 mg/dL 0.73 0.77 0.87  Sodium 135 - 145 mmol/L 141 137 137  Potassium 3.5 - 5.1 mmol/L 3.7 3.5 3.5  Chloride 98 - 111 mmol/L 107 105 105  CO2 22 - 32 mmol/L 25 25 24   Calcium 8.9 - 10.3 mg/dL 8.2(L) 7.7(L) 7.5(L)    CBC Latest Ref Rng & Units 05/06/2019 05/05/2019 05/04/2019  WBC 4.0 - 10.5 K/uL 15.4(H) 21.9(H) 24.7(H)  Hemoglobin 13.0 - 17.0 g/dL 8.4(L) 8.7(L) 8.5(L)  Hematocrit 39.0 - 52.0 % 25.1(L) 25.5(L) 25.3(L)  Platelets 150 - 400 K/uL 91(L) 103(L) 105(L)    Hepatic Function Latest Ref Rng & Units 05/06/2019 05/05/2019 05/04/2019  Total Protein 6.5 - 8.1 g/dL 5.1(L) 4.6(L) 4.9(L)  Albumin 3.5 - 5.0 g/dL 3.8 3.4(L) 3.7  AST 15 - 41 U/L 101(H) 112(H) 126(H)  ALT 0 - 44 U/L 48(H) 51(H) 54(H)  Alk Phosphatase 38 - 126 U/L 91 87 83  Total Bilirubin 0.3 - 1.2 mg/dL 4.9(H) 4.7(H) 4.0(H)     Cardiac Panel (last 3 results) Recent Labs    05/04/19 0410 05/05/19 0329  TROPONINI 0.29* 0.31*     BNP (last 3 results) Recent Labs    05/04/19 0410 05/06/19 0414  BNP 2,029.0* 3,964.0*    ProBNP (last 3 results) No results for input(s): PROBNP in the last 8760 hours.    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 BNP > 2000 05/09 4) hypotension/shock due to severe sepsis/relative hypovolemia.  Improving 5) AKI, resolved 6) mild hyponatremia , resolved 7) alcoholic cirrhosis with ascites.  Rising bilirubin 8) severe sepsis/septic shock due to peritonitis and aspiration PNA 9) ICU acquired anemia without overt bleeding 10) mild thrombocytopenia 11) acute encephalopathy, controlled on propofol 12) history of heavy alcohol abuse   PLAN:  Cont vent support - settings reviewed and/or adjusted  Cont PSV mode as tolerated Cont vent bundle Daily SBT if/when meets criteria Continue norepinephrine as needed to maintain MAP > 65 mmHg Monitor temp, WBC count Micro and abx as above Plan to complete 7 days of meropenem and fluconazole DVT px: SCDs Monitor CBC intermittently Transfuse per usual guidelines Initiate TFs if OK with Gen Surg PAD protocol - propofol and PRN fentanyl   CCM time:  The above time includes time spent in consultation with patient and/or family members and reviewing care plan on multidisciplinary rounds  Merton Border, MD PCCM service Mobile [phone] Pager [phone] 05/06/2019 3:21 PM

## 2019-05-06 NOTE — Progress Notes (Addendum)
Daily Progress Note   Patient Name: Darren [name]       Date: 05/06/2019 DOB: [birth year]  Age: 73 y.o. MRN#: [id number] Attending Physician: Fredirick Maudlin, MD Primary Care Physician: Dion Body, MD Admit Date: 04/27/2019  Reason for Consultation/Follow-up: Psychosocial/spiritual support  Subjective: Patient is resting in bed intubated. Spoke with wife. She states she saw him Friday. Updated her. Plans to initiate tube feeds. Weaning from vent.  She states to use his glasses and speak loudly. She requests daily MD updates from CCM/primary team.   Length of Stay: 6  Current Medications: Scheduled Meds:  . chlorhexidine gluconate (MEDLINE KIT)  15 mL Mouth Rinse BID  . hydrocortisone sod succinate (SOLU-CORTEF) inj  25 mg Intravenous Q8H  . ipratropium-albuterol  3 mL Nebulization Q6H  . mouth rinse  15 mL Mouth Rinse 10 times per day  . pantoprazole (PROTONIX) IV  40 mg Intravenous QHS  . thiamine injection  100 mg Intravenous Daily    Continuous Infusions: . sodium chloride 5 mL/hr at 05/03/19 2350  . fluconazole (DIFLUCAN) IV 400 mg (05/06/19 1049)  . meropenem (MERREM) IV 1 g (05/06/19 1450)  . norepinephrine (LEVOPHED) Adult infusion Stopped (05/06/19 1000)  . propofol (DIPRIVAN) infusion 10 mcg/kg/min (05/06/19 1151)  . sodium chloride 0.9 % with folic acid 1 mg      PRN Meds: sodium chloride, acetaminophen **OR** [DISCONTINUED] acetaminophen, bisacodyl, fentaNYL (SUBLIMAZE) injection, midazolam, sennosides  Physical Exam Pulmonary:     Comments: On vent            Vital Signs: BP 96/62   Pulse (!) 106   Temp 97.7 F (36.5 C) (Oral)   Resp (!) 22   Ht 6' (1.829 m)   Wt 87.4 kg   SpO2 95%   BMI 26.13 kg/m  SpO2: SpO2: 95 % O2 Device: O2 Device:  Ventilator O2 Flow Rate: O2 Flow Rate (L/min): 50 L/min  Intake/output summary:   Intake/Output Summary (Last 24 hours) at 05/06/2019 1456 Last data filed at 05/06/2019 1200 Gross per 24 hour  Intake 804.83 ml  Output 2330 ml  Net -1525.17 ml   LBM: Last BM Date: 05/24/2019 Baseline Weight: Weight: 79.8 kg Most recent weight: Weight: 87.4 kg       Palliative Assessment/Data:    Flowsheet Rows  Most Recent Value  Intake Tab  Referral Department  Critical care  Unit at Time of Referral  ICU  Date Notified  05/02/19  Palliative Care Type  New Palliative care  Reason for referral  Clarify Goals of Care  Date of Admission  05/21/2019  # of days IP prior to Palliative referral  2  Clinical Assessment  Psychosocial & Spiritual Assessment  Palliative Care Outcomes      Patient Active Problem List   Diagnosis Date Noted  . Perforated viscus 05/16/2019    Palliative Care Assessment & Plan     Recommendations/Plan:  Full scope tx and full code.   Code Status:    Code Status Orders  (From admission, onward)         Start     Ordered   05/18/2019 2344  Full code  Continuous     05/06/2019 2350        Code Status History    This patient has a current code status but no historical code status.       Prognosis:   Unable to determine  Discharge Planning:  To Be Determined  Care plan was discussed with RN  Thank you for allowing the Palliative Medicine Team to assist in the care of this patient.   Total Time 25 min Prolonged Time Billed  no      Greater than 50%  of this time was spent counseling and coordinating care related to the above assessment and plan.  Asencion Gowda, NP  Please contact Palliative Medicine Team phone at [phone] for questions and concerns.

## 2019-05-06 NOTE — Progress Notes (Signed)
Pharmacy Antibiotic Note  Darren [name] is a 74 y.o. male admitted on 05/21/2019 with abdominal pain. Patient with perforated viscous on CT and is s/p exploratory laparotomy and repair of perforation. Patient with significant history of alcohol abuse. Patient with penicillin allergy, with reaction of hives. Pharmacy has been consulted for fluconazole dosing.  Plan: Continue Fluconazole 400mg  IV Q24hr, this is day 6 of therapy  Continue Meropenem 1g IV Q8hr, this is day 6 of therapy  Height: 6' (182.9 cm) Weight: 192 lb 10.9 oz (87.4 kg) IBW/kg (Calculated) : 77.6  Temp (24hrs), Avg:98 F (36.7 C), Min:97.5 F (36.4 C), Max:98.5 F (36.9 C)  Recent Labs  Lab 2019/05/04 1119  2019/05/04 1438  05/02/19 1510 05/03/19 0455 05/03/19 2057 05/04/19 0410 05/04/19 1122 05/05/19 0329 05/06/19 0414  WBC  --    < >  --    < >  --  13.5* 18.9* 24.7*  --  21.9* 15.4*  CREATININE  --    < >  --    < >  --  1.00 0.99 0.92 0.87 0.77 0.73  LATICACIDVEN 3.8*  --  3.1*  --  1.3  --   --   --   --   --   --    < > = values in this interval not displayed.    Estimated Creatinine Clearance: 94.3 mL/min (by C-G formula based on SCr of 0.73 mg/dL).    Allergies  Allergen Reactions  . Penicillins Hives    Antimicrobials this admission: Ciprofloxacin 5/5 x 1 Metronidazole 5/6 x 1 Meropenem 5/6 >>  Fluconazole 5/6 >>   Dose adjustments this admission: N/A  Microbiology results: 5/6 BCx: NGTD 5/6 MRSA PCR: negative  5/6 COVID19: negative  5/9 trach aspirate NGTD  Thank you for allowing pharmacy to be a part of this patient's care.  Clovia Cuff, PharmD, BCPS 05/06/2019 11:22 AM

## 2019-05-07 LAB — CBC
HCT: 25.9 % — ABNORMAL LOW (ref 39.0–52.0)
Hemoglobin: 9.1 g/dL — ABNORMAL LOW (ref 13.0–17.0)
MCH: 35.4 pg — ABNORMAL HIGH (ref 26.0–34.0)
MCHC: 35.1 g/dL (ref 30.0–36.0)
MCV: 100.8 fL — ABNORMAL HIGH (ref 80.0–100.0)
Platelets: 91 10*3/uL — ABNORMAL LOW (ref 150–400)
RBC: 2.57 MIL/uL — ABNORMAL LOW (ref 4.22–5.81)
RDW: 17.9 % — ABNORMAL HIGH (ref 11.5–15.5)
WBC: 14.7 10*3/uL — ABNORMAL HIGH (ref 4.0–10.5)
nRBC: 1 % — ABNORMAL HIGH (ref 0.0–0.2)

## 2019-05-07 LAB — CULTURE, RESPIRATORY W GRAM STAIN: Culture: NO GROWTH

## 2019-05-07 LAB — BASIC METABOLIC PANEL
Anion gap: 10 (ref 5–15)
BUN: 23 mg/dL (ref 8–23)
CO2: 26 mmol/L (ref 22–32)
Calcium: 8.3 mg/dL — ABNORMAL LOW (ref 8.9–10.3)
Chloride: 108 mmol/L (ref 98–111)
Creatinine, Ser: 0.72 mg/dL (ref 0.61–1.24)
GFR calc Af Amer: >60 mL/min (ref >60)
GFR calc non Af Amer: >60 mL/min (ref >60)
Glucose, Bld: 162 mg/dL — ABNORMAL HIGH (ref 70–99)
Potassium: 3.7 mmol/L (ref 3.5–5.1)
Sodium: 144 mmol/L (ref 135–145)

## 2019-05-07 LAB — PHOSPHORUS: Phosphorus: 1.7 mg/dL — ABNORMAL LOW (ref 2.5–4.6)

## 2019-05-07 LAB — CULTURE, RESPIRATORY

## 2019-05-07 LAB — TRIGLYCERIDES: Triglycerides: 74 mg/dL (ref <150)

## 2019-05-07 LAB — MAGNESIUM: Magnesium: 2 mg/dL (ref 1.7–2.4)

## 2019-05-07 MED ORDER — POTASSIUM CHLORIDE 20 MEQ/15ML (10%) PO SOLN
40.0000 meq | Freq: Two times a day (BID) | ORAL | Status: AC
  Administered 2019-05-07 (×2): 40 meq
  Filled 2019-05-07 (×2): qty 30

## 2019-05-07 MED ORDER — AMIODARONE HCL IN DEXTROSE 360-4.14 MG/200ML-% IV SOLN
60.0000 mg/h | INTRAVENOUS | Status: AC
  Administered 2019-05-07: 60 mg/h via INTRAVENOUS
  Filled 2019-05-07 (×2): qty 200

## 2019-05-07 MED ORDER — VITAMIN B-1 100 MG PO TABS
100.0000 mg | ORAL_TABLET | Freq: Every day | ORAL | Status: DC
  Administered 2019-05-07 – 2019-05-15 (×9): 100 mg
  Filled 2019-05-07 (×9): qty 1

## 2019-05-07 MED ORDER — AMIODARONE HCL IN DEXTROSE 360-4.14 MG/200ML-% IV SOLN
30.0000 mg/h | INTRAVENOUS | Status: DC
  Administered 2019-05-08 – 2019-05-09 (×3): 30 mg/h via INTRAVENOUS
  Filled 2019-05-07 (×3): qty 200

## 2019-05-07 MED ORDER — FUROSEMIDE 10 MG/ML IJ SOLN
40.0000 mg | Freq: Two times a day (BID) | INTRAMUSCULAR | Status: AC
  Administered 2019-05-07 (×2): 40 mg via INTRAVENOUS
  Filled 2019-05-07 (×2): qty 4

## 2019-05-07 MED ORDER — PANTOPRAZOLE SODIUM 40 MG PO PACK
40.0000 mg | PACK | Freq: Every day | ORAL | Status: DC
  Administered 2019-05-07 – 2019-05-14 (×8): 40 mg
  Filled 2019-05-07 (×8): qty 20

## 2019-05-07 MED ORDER — PIVOT 1.5 CAL PO LIQD
1000.0000 mL | ORAL | Status: DC
  Administered 2019-05-07: 17:00:00 1000 mL
  Filled 2019-05-07: qty 1000

## 2019-05-07 MED ORDER — FOLIC ACID 1 MG PO TABS
1.0000 mg | ORAL_TABLET | Freq: Every day | ORAL | Status: DC
  Administered 2019-05-07 – 2019-05-15 (×9): 1 mg
  Filled 2019-05-07 (×9): qty 1

## 2019-05-07 MED ORDER — FREE WATER
200.0000 mL | Freq: Four times a day (QID) | Status: DC
  Administered 2019-05-07 – 2019-05-13 (×17): 200 mL

## 2019-05-07 MED ORDER — SODIUM PHOSPHATES 45 MMOLE/15ML IV SOLN
30.0000 mmol | Freq: Once | INTRAVENOUS | Status: AC
  Administered 2019-05-07: 30 mmol via INTRAVENOUS
  Filled 2019-05-07: qty 10

## 2019-05-07 MED ORDER — AMIODARONE LOAD VIA INFUSION
150.0000 mg | Freq: Once | INTRAVENOUS | Status: AC
  Administered 2019-05-07: 150 mg via INTRAVENOUS
  Filled 2019-05-07: qty 83.34

## 2019-05-07 NOTE — Progress Notes (Signed)
PCCM PROGRESS NOTE   PT PROFILE/INITIAL PRESENTATION: 73 y.o. male admitted to ICU from the OR after exploratory laparotomy and repair of perforated pyloric ulcer.  Patient has history of alcohol abuse and intraoperative findings consistent with cirrhosis and significant ascites.  He remained intubated postoperatively. PMH of CAD, Htn.  MAJOR EVENTS/TEST RESULTS: 05/06 CT abd/pelvis: Free intraperitoneal air. There are 2 areas of abnormal bowel, within the 1st and 2nd portions of the duodenum where there is wall thickening and indistinctness concerning for peptic ulcer disease. Appearance of the liver is suspicious for cirrhosis. Moderate to large volume ascites 05/06 echocardiogram: LVEF 30-35%.  Severe hypokinesis of apical and periapical regions. 05/06 Admitted as above post op ex laparotomy with finding of perforated pyloric ulcer 05/07 Palliative Care consultation 05/08 Passed SBT and extubated.  Tolerated well initially.  Later in day, poor cough with excessive airway secretions.  Required reintubation.  Post intubation CXR consistent with aspiration (severe bilateral lower lobe airspace disease) 05/09 Full vent support with FiO2 65%.  Requiring vasopressors.  05/10 gas exchange improving.  Tolerates PS 10 cm H2O.  Vasopressor requirement improving. 05/11 UGI Xray: No leak demonstrated.  Tube feeds initiated. 05/12 tolerates pressure support ventilation.  Chest x-ray continues to work demonstrate ARDS versus pulmonary edema.  Furosemide ordered.  Tube feeds advanced.  INDWELLING DEVICES: L radial A-line 05/06 >> 05/08 ETT 05/06 >> 05/08, 05/08 (reintubated) >>  L IJ CVL 05/06 >>    MICRO DATA: SARS-CoV-2 5/5 >> NEG MRSA PCR 05/05 >> NEG Blood 05/05 >> NEG Resp culture 05/09 >>   ANTIMICROBIALS:  Meropenem 05/06 >> 05/13 Fluconazole05/06 >> 05/13  SUBJ:  Remains sedated, intubated.  RASS -3. Synchronous with vent in PSV mode.   Vitals:   05/07/19 0912 05/07/19 1000 05/07/19  1100 05/07/19 1200  BP: 112/75 101/69 103/76 113/74  Pulse: (!) 120 (!) 109 (!) 101 (!) 113  Resp: (!) 26 (!) 22 (!) 23 (!) 25  Temp: 97.7 F (36.5 C)     TempSrc: Axillary     SpO2: 93% 97% 98% 98%  Weight:      Height:        Vent Mode: Spontaneous FiO2 (%):  [40 %] 40 % Set Rate:  [15 bmp] 15 bmp Vt Set:  [450 mL] 450 mL PEEP:  [5 cmH20] 5 cmH20 Pressure Support:  [10 cmH20-15 cmH20] 10 cmH20 Plateau Pressure:  [10 cmH20-18 cmH20] 12 cmH20   05/11 0701 - 05/12 0700 In: 538.5 [I.V.:112.7; NG/GT:134.3; IV Piggyback:291.4] Out: 1875 [Urine:525; Emesis/NG output:250; Drains:1100]   Intake/Output Summary (Last 24 hours) at 05/07/2019 1203 Last data filed at 05/07/2019 1053 Gross per 24 hour  Intake 964.48 ml  Output 1810 ml  Net -845.52 ml     EXAM:  Gen: Sedated, intubated, RASS -3, synchronous with ventilator HEENT: NCAT, + sclericterus Neck: No JVD noted Lungs: Bilateral rhonchi, no wheezes Cardiovascular: tachy, reg, no M Abdomen: Soft, nondistended, + BS Ext: Ecchymoses on all extremities.  4+ symmetric LE edema Neuro: CNs intact, no focal deficits  DATA:   BMP Latest Ref Rng & Units 05/07/2019 05/06/2019 05/05/2019  Glucose 70 - 99 mg/dL 162(H) 126(H) 153(H)  BUN 8 - 23 mg/dL 23 18 17   Creatinine 0.61 - 1.24 mg/dL 0.72 0.73 0.77  Sodium 135 - 145 mmol/L 144 141 137  Potassium 3.5 - 5.1 mmol/L 3.7 3.7 3.5  Chloride 98 - 111 mmol/L 108 107 105  CO2 22 - 32 mmol/L 26 25 25   Calcium 8.9 -  10.3 mg/dL 8.3(L) 8.2(L) 7.7(L)    CBC Latest Ref Rng & Units 05/07/2019 05/06/2019 05/05/2019  WBC 4.0 - 10.5 K/uL 14.7(H) 15.4(H) 21.9(H)  Hemoglobin 13.0 - 17.0 g/dL 9.1(L) 8.4(L) 8.7(L)  Hematocrit 39.0 - 52.0 % 25.9(L) 25.1(L) 25.5(L)  Platelets 150 - 400 K/uL 91(L) 91(L) 103(L)    Hepatic Function Latest Ref Rng & Units 05/06/2019 05/05/2019 05/04/2019  Total Protein 6.5 - 8.1 g/dL 5.1(L) 4.6(L) 4.9(L)  Albumin 3.5 - 5.0 g/dL 3.8 3.4(L) 3.7  AST 15 - 41 U/L 101(H) 112(H)  126(H)  ALT 0 - 44 U/L 48(H) 51(H) 54(H)  Alk Phosphatase 38 - 126 U/L 91 87 83  Total Bilirubin 0.3 - 1.2 mg/dL 4.9(H) 4.7(H) 4.0(H)     Cardiac Panel (last 3 results) Recent Labs    05/05/19 0329  TROPONINI 0.31*    BNP (last 3 results) Recent Labs    05/04/19 0410 05/06/19 0414  BNP 2,029.0* 3,964.0*    ProBNP (last 3 results) No results for input(s): PROBNP in the last 8760 hours.    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 BNP > 2000 05/09 4) hypotension/shock due to severe sepsis/relative hypovolemia 5) AKI, resolved 6) mild hyponatremia , resolved 7) alcoholic cirrhosis with ascites.  Rising bilirubin 8) severe sepsis/septic shock due to peritonitis and aspiration PNA 9) ICU acquired anemia without overt bleeding 10) Tthrombocytopenia 11) acute encephalopathy, controlled on propofol 12) history of heavy alcohol abuse 13 severe hypervolemia   PLAN:  Cont vent support - settings reviewed and/or adjusted  Cont PSV mode as tolerated Cont vent bundle Daily SBT if/when meets criteria Monitor temp, WBC count Micro and abx as above Complete 7 days of meropenem and fluconazole - through 05/13 DVT px: SCDs Monitor CBC intermittently Transfuse per usual guidelines Increase TFs to 40 cc/hr Cont PAD protocol - propofol and PRN fentanyl Furosemide X 2 dose 05/12   I updated wife over the phone.  We again discussed goals of care.  I again encouraged that we consider do not reintubate status after he is extubated next time.  I suggested that he might be ready for extubation in the  next 2-3 days.   CCM time:  The above time includes time spent in consultation with patient and/or family members and reviewing care plan on multidisciplinary rounds  Merton Border, MD PCCM service Mobile [phone] Pager [phone] 05/07/2019 12:03 PM

## 2019-05-07 NOTE — Progress Notes (Addendum)
[facility] SURGICAL PROGRESS NOTE  Hospital Day(s): 7.   Post op day(s): 6 Days Post-Op.   Interval History: Patient seen and examined, no acute events or new complaints overnight. Patient remains intubated and sedated. Leukocytosis (14.7K) and Hgb (9.1) both improved this morning. JP output serosanguinous but 1.1L over last 24 hours per chart review. Started on tube feedings yesterday after UGI showed no leak.   Review of Systems:  Unable to preform secondary to intubation   Vital signs in last 24 hours: [min-max] current  Temp:  [97.6 F (36.4 C)-98.5 F (36.9 C)] 98.2 F (36.8 C) (05/12 0400) Pulse Rate:  [25-123] 121 (05/12 0600) Resp:  [19-30] 28 (05/12 0600) BP: (94-112)/(58-75) 109/73 (05/12 0600) SpO2:  [92 %-100 %] 94 % (05/12 0600) FiO2 (%):  [40 %] 40 % (05/12 0400)     Height: 6' (182.9 cm) Weight: 87.4 kg BMI (Calculated): 26.13   Intake/Output this shift:  No intake/output data recorded.    Physical Exam:  Constitutional: Sedated Respiratory: Ventilated Cardiovascular: tachycardic Gastrointestinal: soft, non-distended, JP in left abdomen with high output, serosanguinous Genitourinary: Foley catheter in place; hematuria present Integumentary: Midline laparotomy incision is CDI with staples, no erythema or drainage   Labs:  CBC Latest Ref Rng & Units 05/07/2019 05/06/2019 05/05/2019  WBC 4.0 - 10.5 K/uL 14.7(H) 15.4(H) 21.9(H)  Hemoglobin 13.0 - 17.0 g/dL 2.8(M) 0.3(K) 9.1(P)  Hematocrit 39.0 - 52.0 % 25.9(L) 25.1(L) 25.5(L)  Platelets 150 - 400 K/uL 91(L) 91(L) 103(L)   CMP Latest Ref Rng & Units 05/07/2019 05/06/2019 05/05/2019  Glucose 70 - 99 mg/dL 915(A) 569(V) 948(A)  BUN 8 - 23 mg/dL 23 18 17   Creatinine 0.61 - 1.24 mg/dL 1.65 5.37 4.82  Sodium 135 - 145 mmol/L 144 141 137  Potassium 3.5 - 5.1 mmol/L 3.7 3.7 3.5  Chloride 98 - 111 mmol/L 108 107 105  CO2 22 - 32 mmol/L 26 25 25   Calcium 8.9 - 10.3 mg/dL 8.3(L) 8.2(L) 7.7(L)  Total  Protein 6.5 - 8.1 g/dL - 5.1(L) 4.6(L)  Total Bilirubin 0.3 - 1.2 mg/dL - 4.9(H) 4.7(H)  Alkaline Phos 38 - 126 U/L - 91 87  AST 15 - 41 U/L - 101(H) 112(H)  ALT 0 - 44 U/L - 48(H) 51(H)    Imaging studies: No new pertinent imaging studies   Assessment/Plan: (ICD-10's: K25.5) 73 y.o. male with improving leukocytosis, stable hemoglobin, now receiving tube feedings although remains intubated and sedated attempting to be weaned from ventilatior 6 Days Post-Op s/p exploratory laparotomy and repair of perforated duodenal (just immediately post-pyloric) ulcer with graham patch.   - Continue tube feedings; dietitian following  - Continue IV Abx (Meropenem - Day 6)   - Monitor leukocytosis; hgb  - Monitor JP output; replete with albumin (6-8 gtram albumin per 1L output); remains serosanguinous.  Output will remain high due to cirrhosis and ascites production, however, if JP is removed, ascites will simply leak from the hole and could potentially result in bacterial peritonitis from tracking.  - appreciate ICU team help; wean from ventilator as tolerates    All of the above findings and recommendations were discussed with the medical team  -- Lynden Oxford, PA-C [facility] 05/07/2019, 7:57 AM [phone] M-F: 7am - 4pm  I saw and evaluated the patient.  I agree with the above documentation, exam, and plan, which I have edited where appropriate. Duanne Guess  10:39 AM

## 2019-05-07 NOTE — Progress Notes (Signed)
Pharmacy Antibiotic Note  Darren [name] is a 73 y.o. male admitted on 05/04/2019 with abdominal pain. Patient with perforated viscous on CT and is s/p exploratory laparotomy and repair of perforation. Patient with significant history of alcohol abuse. Patient with penicillin allergy, with reaction of hives. Pharmacy has been consulted for fluconazole dosing.  Plan: Continue Fluconazole 400mg  IV Q24hr, this is day 7 of therapy  Continue Meropenem 1g IV Q8hr, this is day 7 of therapy  Height: 6' (182.9 cm) Weight: 192 lb 10.9 oz (87.4 kg) IBW/kg (Calculated) : 77.6  Temp (24hrs), Avg:98.1 F (36.7 C), Min:97.6 F (36.4 C), Max:98.6 F (37 C)  Recent Labs  Lab 05/25/2019 1119  04/30/2019 1438  05/02/19 1510  05/03/19 2057 05/04/19 0410 05/04/19 1122 05/05/19 0329 05/06/19 0414 05/07/19 0511  WBC  --    < >  --    < >  --    < > 18.9* 24.7*  --  21.9* 15.4* 14.7*  CREATININE  --    < >  --    < >  --    < > 0.99 0.92 0.87 0.77 0.73 0.72  LATICACIDVEN 3.8*  --  3.1*  --  1.3  --   --   --   --   --   --   --    < > = values in this interval not displayed.    Estimated Creatinine Clearance: 94.3 mL/min (by C-G formula based on SCr of 0.72 mg/dL).    Allergies  Allergen Reactions  . Penicillins Hives    Antimicrobials this admission: Ciprofloxacin 5/5 x 1 Metronidazole 5/6 x 1 Meropenem 5/6 >>  Fluconazole 5/6 >>   Dose adjustments this admission: N/A  Microbiology results: 5/6 BCx: NGTD 5/6 MRSA PCR: negative  5/6 COVID19: negative  5/9 trach aspirate NGTD  Thank you for allowing pharmacy to be a part of this patient's care.  Clovia Cuff, PharmD, BCPS 05/07/2019 2:19 PM

## 2019-05-07 NOTE — Progress Notes (Signed)
Notified  Dr. Belia Heman that patient's heartrate chagned from NSR to AFib on monitor-- 120's- Amiodarone bolus and drip ordered.

## 2019-05-08 ENCOUNTER — Inpatient Hospital Stay: Payer: Medicare HMO

## 2019-05-08 DIAGNOSIS — G9340 Encephalopathy, unspecified: Secondary | ICD-10-CM

## 2019-05-08 DIAGNOSIS — R6520 Severe sepsis without septic shock: Secondary | ICD-10-CM

## 2019-05-08 DIAGNOSIS — I420 Dilated cardiomyopathy: Secondary | ICD-10-CM

## 2019-05-08 LAB — COMPREHENSIVE METABOLIC PANEL
ALT: 46 U/L — ABNORMAL HIGH (ref 0–44)
AST: 85 U/L — ABNORMAL HIGH (ref 15–41)
Albumin: 2.9 g/dL — ABNORMAL LOW (ref 3.5–5.0)
Alkaline Phosphatase: 155 U/L — ABNORMAL HIGH (ref 38–126)
Anion gap: 7 (ref 5–15)
BUN: 31 mg/dL — ABNORMAL HIGH (ref 8–23)
CO2: 30 mmol/L (ref 22–32)
Calcium: 8.3 mg/dL — ABNORMAL LOW (ref 8.9–10.3)
Chloride: 109 mmol/L (ref 98–111)
Creatinine, Ser: 0.61 mg/dL (ref 0.61–1.24)
GFR calc Af Amer: >60 mL/min (ref >60)
GFR calc non Af Amer: >60 mL/min (ref >60)
Glucose, Bld: 214 mg/dL — ABNORMAL HIGH (ref 70–99)
Potassium: 3.7 mmol/L (ref 3.5–5.1)
Sodium: 146 mmol/L — ABNORMAL HIGH (ref 135–145)
Total Bilirubin: 3.8 mg/dL — ABNORMAL HIGH (ref 0.3–1.2)
Total Protein: 4.6 g/dL — ABNORMAL LOW (ref 6.5–8.1)

## 2019-05-08 LAB — PHOSPHORUS: Phosphorus: 2.2 mg/dL — ABNORMAL LOW (ref 2.5–4.6)

## 2019-05-08 LAB — CBC
HCT: 25.8 % — ABNORMAL LOW (ref 39.0–52.0)
Hemoglobin: 8.8 g/dL — ABNORMAL LOW (ref 13.0–17.0)
MCH: 34.5 pg — ABNORMAL HIGH (ref 26.0–34.0)
MCHC: 34.1 g/dL (ref 30.0–36.0)
MCV: 101.2 fL — ABNORMAL HIGH (ref 80.0–100.0)
Platelets: 82 10*3/uL — ABNORMAL LOW (ref 150–400)
RBC: 2.55 MIL/uL — ABNORMAL LOW (ref 4.22–5.81)
RDW: 17.3 % — ABNORMAL HIGH (ref 11.5–15.5)
WBC: 18.2 10*3/uL — ABNORMAL HIGH (ref 4.0–10.5)
nRBC: 0.9 % — ABNORMAL HIGH (ref 0.0–0.2)

## 2019-05-08 LAB — MAGNESIUM: Magnesium: 1.9 mg/dL (ref 1.7–2.4)

## 2019-05-08 MED ORDER — PIVOT 1.5 CAL PO LIQD
1000.0000 mL | ORAL | Status: DC
  Filled 2019-05-08: qty 1000

## 2019-05-08 MED ORDER — POTASSIUM CHLORIDE 20 MEQ/15ML (10%) PO SOLN
40.0000 meq | Freq: Two times a day (BID) | ORAL | Status: AC
  Administered 2019-05-08 (×2): 40 meq
  Filled 2019-05-08 (×2): qty 30

## 2019-05-08 MED ORDER — PIVOT 1.5 CAL PO LIQD
1000.0000 mL | ORAL | Status: DC
  Administered 2019-05-08: 1000 mL
  Filled 2019-05-08: qty 1000

## 2019-05-08 MED ORDER — FUROSEMIDE 10 MG/ML IJ SOLN
40.0000 mg | Freq: Once | INTRAMUSCULAR | Status: AC
  Administered 2019-05-08: 11:00:00 40 mg via INTRAVENOUS
  Filled 2019-05-08: qty 4

## 2019-05-08 NOTE — Progress Notes (Signed)
PCCM PROGRESS NOTE   PT PROFILE/INITIAL PRESENTATION: 73 y.o. male admitted to ICU from the OR after exploratory laparotomy and repair of perforated pyloric ulcer.  Patient has history of alcohol abuse and intraoperative findings consistent with cirrhosis and significant ascites.  He remained intubated postoperatively. PMH of CAD, Htn.  MAJOR EVENTS/TEST RESULTS: 05/06 CT abd/pelvis: Free intraperitoneal air. There are 2 areas of abnormal bowel, within the 1st and 2nd portions of the duodenum where there is wall thickening and indistinctness concerning for peptic ulcer disease. Appearance of the liver is suspicious for cirrhosis. Moderate to large volume ascites 05/06 echocardiogram: LVEF 30-35%.  Severe hypokinesis of apical and periapical regions. 05/06 Admitted as above post op ex laparotomy with finding of perforated pyloric ulcer 05/07 Palliative Care consultation 05/08 Passed SBT and extubated.  Tolerated well initially.  Later in day, poor cough with excessive airway secretions.  Required reintubation.  Post intubation CXR consistent with aspiration (severe bilateral lower lobe airspace disease) 05/09 Full vent support with FiO2 65%.  Requiring vasopressors.  05/10 gas exchange improving.  Tolerates PS 10 cm H2O.  Vasopressor requirement improving. 05/11 UGI Xray: No leak demonstrated.  Tube feeds initiated. 05/12 tolerates pressure support ventilation.  Chest x-ray continues to work demonstrate ARDS versus pulmonary edema.  Furosemide ordered.  Tube feeds advanced. 05/13 Tolerates PS 10 cm H2O. Further diuresis with furosemide. Tolerating TFs - advanced to goal rate of 55 cc/hr. Goals of care conversation with wife - optimize resp status over next 24-48 hrs, then, one way extubation  INDWELLING DEVICES: L radial A-line 05/06 >> 05/08 ETT 05/06 >> 05/08, 05/08 (reintubated) >>  L IJ CVL 05/06 >>    MICRO DATA: SARS-CoV-2 5/5 >> NEG MRSA PCR 05/05 >> NEG Blood 05/05 >> NEG Resp  culture 05/09 >> NEG  ANTIMICROBIALS:  Meropenem 05/06 >> 05/13 Fluconazole05/06 >> 05/13  SUBJ:  Remains sedated, intubated.  RASS -1, -2. Not F/C Synchronous with vent. Comfortable in PSV mode @ 10 cm H2O.   Vitals:   05/08/19 1000 05/08/19 1100 05/08/19 1200 05/08/19 1300  BP: 103/62 109/72 96/63 (!) 101/57  Pulse: 100 (!) 103 (!) 44 96  Resp: (!) 24 (!) 24 (!) 24 (!) 25  Temp:   98.5 F (36.9 C)   TempSrc:   Oral   SpO2: 100% 99% (!) 87% 100%  Weight:      Height:        Vent Mode: PSV FiO2 (%):  [35 %-40 %] 35 % Set Rate:  [15 bmp] 15 bmp Vt Set:  [450 mL] 450 mL PEEP:  [5 cmH20] 5 cmH20 Pressure Support:  [10 cmH20-15 cmH20] 10 cmH20 Plateau Pressure:  [13 cmH20-14 cmH20] 14 cmH20   05/12 0701 - 05/13 0700 In: 2075.3 [I.V.:443.3; NG/GT:1082; IV Piggyback:550] Out: 6599 [Urine:2825; Drains:1180]   Intake/Output Summary (Last 24 hours) at 05/08/2019 1451 Last data filed at 05/08/2019 1238 Gross per 24 hour  Intake 2896.19 ml  Output 3770 ml  Net -873.81 ml     EXAM:  Gen: Sedated, intubated, RASS -1, -2.  Not F/C HEENT: NCAT, + sclericterus Neck: No JVD noted Lungs: Bilateral rhonchi, no wheezes Cardiovascular: tachy, reg, no M Abdomen: Soft, nondistended, + BS Ext: Ecchymoses on all extremities.  Improving pretibial and ankle edema.  Symmetric pedal edema Neuro: CNs intact, no focal deficits  DATA:   BMP Latest Ref Rng & Units 05/08/2019 05/07/2019 05/06/2019  Glucose 70 - 99 mg/dL 214(H) 162(H) 126(H)  BUN 8 - 23 mg/dL  31(H) 23 18  Creatinine 0.61 - 1.24 mg/dL 0.61 0.72 0.73  Sodium 135 - 145 mmol/L 146(H) 144 141  Potassium 3.5 - 5.1 mmol/L 3.7 3.7 3.7  Chloride 98 - 111 mmol/L 109 108 107  CO2 22 - 32 mmol/L 30 26 25   Calcium 8.9 - 10.3 mg/dL 8.3(L) 8.3(L) 8.2(L)    CBC Latest Ref Rng & Units 05/08/2019 05/07/2019 05/06/2019  WBC 4.0 - 10.5 K/uL 18.2(H) 14.7(H) 15.4(H)  Hemoglobin 13.0 - 17.0 g/dL 8.8(L) 9.1(L) 8.4(L)  Hematocrit 39.0 - 52.0 %  25.8(L) 25.9(L) 25.1(L)  Platelets 150 - 400 K/uL 82(L) 91(L) 91(L)    Hepatic Function Latest Ref Rng & Units 05/08/2019 05/06/2019 05/05/2019  Total Protein 6.5 - 8.1 g/dL 4.6(L) 5.1(L) 4.6(L)  Albumin 3.5 - 5.0 g/dL 2.9(L) 3.8 3.4(L)  AST 15 - 41 U/L 85(H) 101(H) 112(H)  ALT 0 - 44 U/L 46(H) 48(H) 51(H)  Alk Phosphatase 38 - 126 U/L 155(H) 91 87  Total Bilirubin 0.3 - 1.2 mg/dL 3.8(H) 4.9(H) 4.7(H)     Cardiac Panel (last 3 results) No results for input(s): CKTOTAL, CKMB, TROPONINI, RELINDX in the last 72 hours.  BNP (last 3 results) Recent Labs    05/04/19 0410 05/06/19 0414  BNP 2,029.0* 3,964.0*    ProBNP (last 3 results) No results for input(s): PROBNP in the last 8760 hours.    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 BNP > 3000 05/11 4) hypotension/shock (septic + cardiogenic), resolved 5) AKI, resolved 6) mild hypernatremia 7) alcoholic cirrhosis with ascites.   8) severe sepsis due to peritonitis and aspiration PNA 9) ICU acquired anemia without overt bleeding 10) Thrombocytopenia 11) acute encephalopathy, controlled on propofol 12) history of heavy alcohol abuse 13) Hypervolemia, improving    PLAN:  Cont vent support - settings reviewed and/or adjusted  Cont weaning in PSV mode as tolerated Cont vent bundle Daily SBT if/when meets criteria Monitor temp, WBC count Micro and abx as above Complete 7 days of meropenem and fluconazole - through today DVT px: SCDs Monitor CBC intermittently Transfuse per usual guidelines Increase TFs to 55 cc/hour Cont PAD  protocol - propofol and PRN fentanyl Furosemide X 1 dose 05/13   I met with patient's wife in person today.  I again reviewed his hospital course and current critical status.  We discussed goals of care.  He has previously expressed to her that he would not wish to be sustained on prolonged mechanical ventilation if prognosis for functional recovery were poor.  We agreed to optimize his respiratory status, extubate with plan for no reintubation.  I expressed that I hope that we can reach this optimized status in the next day or 2.   CCM time: 45 mins The above time includes time spent in consultation with patient and/or family members and reviewing care plan on multidisciplinary rounds  Merton Border, MD PCCM service Mobile [phone] Pager [phone] 05/08/2019 2:51 PM

## 2019-05-08 NOTE — Progress Notes (Signed)
Pharmacy Antibiotic Note  Darren [name] is a 73 y.o. male admitted on 05/08/19 with abdominal pain. Patient with perforated viscous on CT and is s/p exploratory laparotomy and repair of perforation. Patient with significant history of alcohol abuse. Patient with penicillin allergy, with reaction of hives. Pharmacy has been consulted for fluconazole dosing.  Plan: Completed 7 days of  Fluconazole 400mg  IV Q24hr  Will complete 7 days of Meropenem 1g IV Q8hr this evening.   Height: 6' (182.9 cm) Weight: 192 lb 10.9 oz (87.4 kg) IBW/kg (Calculated) : 77.6  Temp (24hrs), Avg:98.6 F (37 C), Min:98 F (36.7 C), Max:99 F (37.2 C)  Recent Labs  Lab 05/12/2019 1438  05/02/19 1510  05/04/19 0410 05/04/19 1122 05/05/19 0329 05/06/19 0414 05/07/19 0511 05/08/19 0433  WBC  --    < >  --    < > 24.7*  --  21.9* 15.4* 14.7* 18.2*  CREATININE  --    < >  --    < > 0.92 0.87 0.77 0.73 0.72 0.61  LATICACIDVEN 3.1*  --  1.3  --   --   --   --   --   --   --    < > = values in this interval not displayed.    Estimated Creatinine Clearance: 94.3 mL/min (by C-G formula based on SCr of 0.61 mg/dL).    Allergies  Allergen Reactions  . Penicillins Hives    Antimicrobials this admission: Ciprofloxacin 5/5 x 1 Metronidazole 5/6 x 1 Meropenem 5/6 >> 5/13 Fluconazole 5/6 >> 5/13  Dose adjustments this admission: N/A  Microbiology results: 5/6 BCx: NGTD 5/6 MRSA PCR: negative  5/6 COVID19: negative  5/9 trach aspirate NGTD  Thank you for allowing pharmacy to be a part of this patient's care.  Clovia Cuff, PharmD, BCPS 05/08/2019 11:55 AM

## 2019-05-08 NOTE — Progress Notes (Signed)
[name] SUR[facility]ICAL PROGRESS NOTE  Hospital Day(s): 8.   Post op day(s): 7 Days Post-Op.   Interval History: Patient seen and examined, patient remains intubated and sedated. Leukocytosis worsening to 18K this morning. Hemoglobin remains stable without evidence of bleeding.  JP still with >1L output in last 24 hours per chart review. Tube feedings advanced yesterday to 40 cc/hr.    Review of Systems:  Unable to preform secondary to intubation   Vital signs in last 24 hours: [min-max] current  Temp:  [97.7 F (36.5 C)-99 F (37.2 C)] 99 F (37.2 C) (05/13 0400) Pulse Rate:  [97-120] 103 (05/13 0700) Resp:  [16-29] 26 (05/13 0700) BP: (93-113)/(53-76) 107/60 (05/13 0700) SpO2:  [93 %-100 %] 100 % (05/13 0728) FiO2 (%):  [35 %-40 %] 35 % (05/13 0728)     Height: 6' (182.9 cm) Weight: 87.4 kg BMI (Calculated): 26.13   Intake/Output this shift:  No intake/output data recorded.    Physical Exam:  Constitutional: Sedated Respiratory: Ventilated Cardiovascular: tachycardic Gastrointestinal: soft, non-distended, JP in left abdomen with high output, serosanguinous Genitourinary: Foley catheter in place Integumentary: Midline laparotomy incision is CDI with staples, no erythema or drainage   Labs:  CBC Latest Ref Rng & Units 05/08/2019 05/07/2019 05/06/2019  WBC 4.0 - 10.5 K/uL 18.2(H) 14.7(H) 15.4(H)  Hemoglobin 13.0 - 17.0 g/dL 0.9(Q) 3.3(A) 0.7(M)  Hematocrit 39.0 - 52.0 % 25.8(L) 25.9(L) 25.1(L)  Platelets 150 - 400 K/uL 82(L) 91(L) 91(L)   CMP Latest Ref Rng & Units 05/08/2019 05/07/2019 05/06/2019  Glucose 70 - 99 mg/dL 226(J) 335(K) 562(B)  BUN 8 - 23 mg/dL 63(S) 23 18  Creatinine 0.61 - 1.24 mg/dL 9.37 3.42 8.76  Sodium 135 - 145 mmol/L 146(H) 144 141  Potassium 3.5 - 5.1 mmol/L 3.7 3.7 3.7  Chloride 98 - 111 mmol/L 109 108 107  CO2 22 - 32 mmol/L 30 26 25   Calcium 8.9 - 10.3 mg/dL 8.3(L) 8.3(L) 8.2(L)  Total Protein 6.5 - 8.1 g/dL 4.6(L) - 5.1(L)   Total Bilirubin 0.3 - 1.2 mg/dL 8.1(L) - 4.9(H)  Alkaline Phos 38 - 126 U/L 155(H) - 91  AST 15 - 41 U/L 85(H) - 101(H)  ALT 0 - 44 U/L 46(H) - 48(H)     Imaging studies:   CXR (05/08/2019) personally reviewed and radiologist report reviewed:  IMPRESSION: 1.  Stable lines and tubes. 2. Mildly improved perihilar ventilation since 05/06/2019 with continued diffuse coarse interstitial opacity and left lower lobe collapse or consolidation.   Assessment/Plan: (ICD-10's: K25.5) 73 y.o. male with worsening leukocytosis and what appears to be mildly improved pulmonary edema on CXR who remains intubated and sedated 7 Days Post-Op s/p exploratory laparotomy and repair of perforated duodenal (just immediately post-pyloric) ulcer with graham patch   - Continue tube feedings; dietitian following             - Continue IV Abx (Meropenem - Day 7)              - Monitor leukocytosis; hgb  - monitor bowel function             - Monitor JP output; replete with albumin (6-8 gtram albumin per 1L output); remains serosanguinous.  Output will remain high due to cirrhosis and ascites production, however, if JP is removed, ascites will simply leak from the hole and could potentially result in bacterial peritonitis from tracking.             - appreciate ICU team help;  wean from ventilator as tolerates    All of the above findings and recommendations were discussed with the medical team.  -- Darren OxfordZachary Schulz, PA-C [facility] 05/08/2019, 7:58 AM [phone] M-F: 7am - 4pm

## 2019-05-09 ENCOUNTER — Inpatient Hospital Stay: Payer: Medicare HMO

## 2019-05-09 LAB — COMPREHENSIVE METABOLIC PANEL
ALT: 42 U/L (ref 0–44)
AST: 74 U/L — ABNORMAL HIGH (ref 15–41)
Albumin: 2.6 g/dL — ABNORMAL LOW (ref 3.5–5.0)
Alkaline Phosphatase: 164 U/L — ABNORMAL HIGH (ref 38–126)
Anion gap: 5 (ref 5–15)
BUN: 43 mg/dL — ABNORMAL HIGH (ref 8–23)
CO2: 31 mmol/L (ref 22–32)
Calcium: 8.1 mg/dL — ABNORMAL LOW (ref 8.9–10.3)
Chloride: 112 mmol/L — ABNORMAL HIGH (ref 98–111)
Creatinine, Ser: 1 mg/dL (ref 0.61–1.24)
GFR calc Af Amer: >60 mL/min (ref >60)
GFR calc non Af Amer: >60 mL/min (ref >60)
Glucose, Bld: 167 mg/dL — ABNORMAL HIGH (ref 70–99)
Potassium: 5.1 mmol/L (ref 3.5–5.1)
Sodium: 148 mmol/L — ABNORMAL HIGH (ref 135–145)
Total Bilirubin: 3.1 mg/dL — ABNORMAL HIGH (ref 0.3–1.2)
Total Protein: 4.5 g/dL — ABNORMAL LOW (ref 6.5–8.1)

## 2019-05-09 LAB — CBC
HCT: 29.4 % — ABNORMAL LOW (ref 39.0–52.0)
Hemoglobin: 9.5 g/dL — ABNORMAL LOW (ref 13.0–17.0)
MCH: 33 pg (ref 26.0–34.0)
MCHC: 32.3 g/dL (ref 30.0–36.0)
MCV: 102.1 fL — ABNORMAL HIGH (ref 80.0–100.0)
Platelets: 78 10*3/uL — ABNORMAL LOW (ref 150–400)
RBC: 2.88 MIL/uL — ABNORMAL LOW (ref 4.22–5.81)
RDW: 18.2 % — ABNORMAL HIGH (ref 11.5–15.5)
WBC: 23.3 10*3/uL — ABNORMAL HIGH (ref 4.0–10.5)
nRBC: 0.7 % — ABNORMAL HIGH (ref 0.0–0.2)

## 2019-05-09 LAB — BRAIN NATRIURETIC PEPTIDE: B Natriuretic Peptide: 1728 pg/mL — ABNORMAL HIGH (ref 0.0–100.0)

## 2019-05-09 LAB — MAGNESIUM: Magnesium: 1.8 mg/dL (ref 1.7–2.4)

## 2019-05-09 LAB — PHOSPHORUS: Phosphorus: 2.7 mg/dL (ref 2.5–4.6)

## 2019-05-09 MED ORDER — AMIODARONE HCL 200 MG PO TABS
400.0000 mg | ORAL_TABLET | Freq: Two times a day (BID) | ORAL | Status: DC
  Administered 2019-05-09 – 2019-05-14 (×11): 400 mg
  Filled 2019-05-09 (×10): qty 2

## 2019-05-09 MED ORDER — FUROSEMIDE 10 MG/ML IJ SOLN
40.0000 mg | Freq: Once | INTRAMUSCULAR | Status: AC
  Administered 2019-05-09: 09:00:00 40 mg via INTRAVENOUS
  Filled 2019-05-09: qty 4

## 2019-05-09 MED ORDER — FENTANYL CITRATE (PF) 100 MCG/2ML IJ SOLN
12.5000 ug | INTRAMUSCULAR | Status: DC | PRN
  Administered 2019-05-09 – 2019-05-15 (×3): 12.5 ug via INTRAVENOUS
  Filled 2019-05-09 (×3): qty 2

## 2019-05-09 MED ORDER — DEXTROSE 5 % IV SOLN
INTRAVENOUS | Status: AC
  Administered 2019-05-09 (×2): via INTRAVENOUS

## 2019-05-09 NOTE — Progress Notes (Signed)
Elink  RN entry:  Family member(Sheila) was phoned concerning tele visit.  Message was left that tele visit invitation would be sent at 2000 today.

## 2019-05-09 NOTE — TOC Initial Note (Signed)
Transition of Care [facility]) - Initial/Assessment Note    Patient Details  Name: Darren [name] MRN: [id number] Date of Birth: June 16, 1946  [facility]) CM/SW Contact:    Allayne Butcher, RN Phone Number: 05/09/2019, 11:10 AM  Clinical Narrative:                 Patient remains critically ill in the ICU.  Plan is for patient to be extubated once wife arrives and not to be re intubated.  RNCM unsure of how patient will do once extubated.  RNCM will follow patient progress.    Expected Discharge Plan: (unsure of disposition) Barriers to Discharge: Continued Medical Work up   Patient Goals and CMS Choice        Expected Discharge Plan and Services Expected Discharge Plan: (unsure of disposition)                                              Prior Living Arrangements/Services   Lives with:: Spouse          Need for Family Participation in Patient Care: Yes (Comment)(Pt is critically ill and unable to make decisions for himself at this time) Care giver support system in place?: Yes (comment)(wife)   Criminal Activity/Legal Involvement Pertinent to Current Situation/Hospitalization: No - Comment as needed  Activities of Daily Living      Permission Sought/Granted                  Emotional Assessment Appearance:: Appears stated age Attitude/Demeanor/Rapport: Intubated (Following Commands or Not Following Commands) Affect (typically observed): (intubated, sedated)   Alcohol / Substance Use: Alcohol Use Psych Involvement: No (comment)  Admission diagnosis:  Free intraperitoneal air [K66.8] Abdominal pain, unspecified abdominal location [R10.9] Patient Active Problem List   Diagnosis Date Noted  . Perforated viscus 05-06-19   PCP:  Marisue Ivan, MD Pharmacy:   [phone] - [place], [place] - [address] [place] [address] Phone: [phone] Fax: [phone]     Social Determinants of Health (SDOH)  Interventions    Readmission Risk Interventions No flowsheet data found.

## 2019-05-09 NOTE — Progress Notes (Addendum)
Daily Progress Note   Patient Name: Darren [name]       Date: 05/09/2019 DOB: 07-06-1946  Age: 73 y.o. MRN#: [id number] Attending Physician: Fredirick Maudlin, MD Primary Care Physician: Dion Body, MD Admit Date: 05/09/2019  Reason for Consultation/Follow-up: Psychosocial/spiritual support  Subjective: Patient is resting in bed intubated. No distress noted. No family at beside. Spoke with CCM NP, plans in place for 1 way extubation today when wife arrives.   Addendum: Met with wife at bedside. Patient extubated. She is hopeful. We discussed his respiratory status, as well as liver disease and ascites. Will continue to follow.    Length of Stay: 9  Current Medications: Scheduled Meds:  . amiodarone  400 mg Per Tube BID  . chlorhexidine gluconate (MEDLINE KIT)  15 mL Mouth Rinse BID  . folic acid  1 mg Per Tube Daily  . free water  200 mL Per Tube Q6H  . ipratropium-albuterol  3 mL Nebulization Q6H  . mouth rinse  15 mL Mouth Rinse 10 times per day  . pantoprazole sodium  40 mg Per Tube Q1200  . thiamine  100 mg Per Tube Daily    Continuous Infusions: . sodium chloride 5 mL/hr at 05/03/19 2350  . dextrose 100 mL/hr at 05/09/19 1000  . feeding supplement (PIVOT 1.5 CAL) 50 mL/hr at 05/09/19 0600  . propofol (DIPRIVAN) infusion 5 mcg/kg/min (05/09/19 1000)  . sodium chloride 0.9 % with folic acid 1 mg Stopped (05/07/19 1930)    PRN Meds: sodium chloride, acetaminophen **OR** [DISCONTINUED] acetaminophen, bisacodyl, sennosides  Physical Exam Pulmonary:     Comments: On vent            Vital Signs: BP (!) 101/52   Pulse 98   Temp 100 F (37.8 C) (Oral)   Resp (!) 25   Ht 6' (1.829 m)   Wt 87.4 kg   SpO2 98%   BMI 26.13 kg/m  SpO2: SpO2: 98 % O2 Device: O2  Device: Ventilator O2 Flow Rate: O2 Flow Rate (L/min): 50 L/min  Intake/output summary:   Intake/Output Summary (Last 24 hours) at 05/09/2019 1221 Last data filed at 05/09/2019 1000 Gross per 24 hour  Intake 1918.68 ml  Output 3390 ml  Net -1471.32 ml   LBM: Last BM Date: 05/18/2019 Baseline Weight: Weight: 79.8 kg Most  recent weight: Weight: 87.4 kg       Palliative Assessment/Data:    Flowsheet Rows     Most Recent Value  Intake Tab  Referral Department  Critical care  Unit at Time of Referral  ICU  Date Notified  05/02/19  Palliative Care Type  New Palliative care  Reason for referral  Clarify Goals of Care  Date of Admission  05/10/2019  Date first seen by Palliative Care  05/02/19  # of days Palliative referral response time  0 Day(s)  # of days IP prior to Palliative referral  2  Clinical Assessment  Psychosocial & Spiritual Assessment  Palliative Care Outcomes      Patient Active Problem List   Diagnosis Date Noted  . Perforated viscus 05/08/2019    Palliative Care Assessment & Plan     Recommendations/Plan:  Current plan for 1 way extubation today when wife arrives.   Code Status:    Code Status Orders  (From admission, onward)         Start     Ordered   05/05/2019 2344  Full code  Continuous     05/05/2019 2350        Code Status History    This patient has a current code status but no historical code status.       Prognosis:   Unable to determine  Discharge Planning:  To Be Determined  Care plan was discussed with RN  Thank you for allowing the Palliative Medicine Team to assist in the care of this patient.   Total Time 15 min Prolonged Time Billed  no      Greater than 50%  of this time was spent counseling and coordinating care related to the above assessment and plan.  Asencion Gowda, NP  Please contact Palliative Medicine Team phone at [phone] for questions and concerns.

## 2019-05-09 NOTE — Progress Notes (Signed)
Patient extubated per NP request. Pt extubated to 3l  with no complications. Will continue to monitor patient.

## 2019-05-09 NOTE — Progress Notes (Signed)
PCCM PROGRESS NOTE   PT PROFILE/INITIAL PRESENTATION: 73 y.o. male admitted to ICU from the OR after exploratory laparotomy and repair of perforated pyloric ulcer.  Patient has history of alcohol abuse and intraoperative findings consistent with cirrhosis and significant ascites.  He remained intubated postoperatively. PMH of CAD, Htn.  MAJOR EVENTS/TEST RESULTS: 05/06 CT abd/pelvis: Free intraperitoneal air. There are 2 areas of abnormal bowel, within the 1st and 2nd portions of the duodenum where there is wall thickening and indistinctness concerning for peptic ulcer disease. Appearance of the liver is suspicious for cirrhosis. Moderate to large volume ascites 05/06 echocardiogram: LVEF 30-35%.  Severe hypokinesis of apical and periapical regions. 05/06 Admitted as above post op ex laparotomy with finding of perforated pyloric ulcer 05/07 Palliative Care consultation 05/08 Passed SBT and extubated.  Tolerated well initially.  Later in day, poor cough with excessive airway secretions.  Required reintubation.  Post intubation CXR consistent with aspiration (severe bilateral lower lobe airspace disease) 05/09 Full vent support with FiO2 65%.  Requiring vasopressors.  05/10 gas exchange improving.  Tolerates PS 10 cm H2O.  Vasopressor requirement improving. 05/11 UGI Xray: No leak demonstrated.  Tube feeds initiated. 05/12 tolerates pressure support ventilation.  Chest x-ray continues to work demonstrate ARDS versus pulmonary edema.  Furosemide ordered.  Tube feeds advanced. Amiodarone initiated for recurrent AF 05/13 Tolerated PS 10 cm H2O. Further diuresis with furosemide. Tolerating TFs - advanced to goal rate of 55 cc/hr. Goals of care conversation with wife - optimize resp status over next 24-48 hrs, then, one way extubation 05/14 RASS -2. + F/C. NSR. Amiodarone transitioned to enteral - continue loading dose. Passed SBT. Plan one way extubation when wife arrives  INDWELLING DEVICES: L radial  A-line 05/06 >> 05/08 ETT 05/06 >> 05/08, 05/08 (reintubated) >>  L IJ CVL 05/06 >>    MICRO DATA: SARS-CoV-2 5/5 >> NEG MRSA PCR 05/05 >> NEG Blood 05/05 >> NEG Resp culture 05/09 >> NEG  ANTIMICROBIALS:  Meropenem 05/06 >> 05/13 Fluconazole05/06 >> 05/13  SUBJ:  RASS -2. + F/C. Passed SBT. Plan one way extubation when wife arrives   Vitals:   05/09/19 0800 05/09/19 0900 05/09/19 1000 05/09/19 1100  BP: (!) 94/51 94/66 103/61 (!) 101/52  Pulse: (!) 101 95 95 98  Resp: (!) 29 (!) 24 (!) 26 (!) 25  Temp:      TempSrc:      SpO2: 99% 99% 100% 98%  Weight:      Height:        Vent Mode: PSV FiO2 (%):  [35 %] 35 % Set Rate:  [15 bmp] 15 bmp Vt Set:  [450 mL] 450 mL PEEP:  [5 cmH20] 5 cmH20 Pressure Support:  [5 cmH20-10 cmH20] 5 cmH20 Plateau Pressure:  [8 cmH20-17 cmH20] 8 cmH20   05/13 0701 - 05/14 0700 In: 3240.8 [I.V.:457; WU/JW:1191.4; IV Piggyback:319.2] Out: 3040 [Urine:1175; Drains:1865]   Intake/Output Summary (Last 24 hours) at 05/09/2019 1210 Last data filed at 05/09/2019 1000 Gross per 24 hour  Intake 1918.68 ml  Output 3390 ml  Net -1471.32 ml     EXAM:  Gen: Remains intubated, RASS -2.  Follows commands HEENT: NCAT, + sclericterus Neck: No JVD noted Lungs: Clear anteriorly Cardiovascular: Regular, no M Abdomen: Soft, nondistended, + BS Ext: Improved pedal edema Neuro: CNs intact, no focal deficits  DATA:   BMP Latest Ref Rng & Units 05/09/2019 05/08/2019 05/07/2019  Glucose 70 - 99 mg/dL 167(H) 214(H) 162(H)  BUN 8 - 23  mg/dL 43(H) 31(H) 23  Creatinine 0.61 - 1.24 mg/dL 1.00 0.61 0.72  Sodium 135 - 145 mmol/L 148(H) 146(H) 144  Potassium 3.5 - 5.1 mmol/L 5.1 3.7 3.7  Chloride 98 - 111 mmol/L 112(H) 109 108  CO2 22 - 32 mmol/L 31 30 26   Calcium 8.9 - 10.3 mg/dL 8.1(L) 8.3(L) 8.3(L)    CBC Latest Ref Rng & Units 05/09/2019 05/08/2019 05/07/2019  WBC 4.0 - 10.5 K/uL 23.3(H) 18.2(H) 14.7(H)  Hemoglobin 13.0 - 17.0 g/dL 9.5(L) 8.8(L) 9.1(L)   Hematocrit 39.0 - 52.0 % 29.4(L) 25.8(L) 25.9(L)  Platelets 150 - 400 K/uL 78(L) 82(L) 91(L)    Hepatic Function Latest Ref Rng & Units 05/09/2019 05/08/2019 05/06/2019  Total Protein 6.5 - 8.1 g/dL 4.5(L) 4.6(L) 5.1(L)  Albumin 3.5 - 5.0 g/dL 2.6(L) 2.9(L) 3.8  AST 15 - 41 U/L 74(H) 85(H) 101(H)  ALT 0 - 44 U/L 42 46(H) 48(H)  Alk Phosphatase 38 - 126 U/L 164(H) 155(H) 91  Total Bilirubin 0.3 - 1.2 mg/dL 3.1(H) 3.8(H) 4.9(H)     Cardiac Panel (last 3 results) No results for input(s): CKTOTAL, CKMB, TROPONINI, RELINDX in the last 72 hours.  BNP (last 3 results) Recent Labs    05/04/19 0410 05/06/19 0414 05/09/19 0509  BNP 2,029.0* 3,964.0* 1,728.0*    ProBNP (last 3 results) No results for input(s): PROBNP in the last 8760 hours.    CXR: Continued improvement in edema pattern  I have personally reviewed all chest radiographs reported above including CXRs and CT chest unless otherwise indicated  IMPRESSION:   S/P emergent laparotomy for perforated pyloric ulcer with postoperative shock and ventilator dependence  1) prolonged post op VDRF with failed extubation 05/08 2) acute hypoxemic resp failure.  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  4) hypotension/shock (septic + cardiogenic), resolved 5) AKI, resolved 6) Hypernatremia 7) alcoholic cirrhosis with ascites   8) severe sepsis due to peritonitis and aspiration PNA, treated 9) ICU acquired anemia without overt bleeding 10) Thrombocytopenia 11) acute encephalopathy, much improved 12) history of heavy alcohol abuse 13) Hypervolemia, much improved   PLAN:  Continue vent support for now Anticipate one-way extubation later today after wife arrives Monitor temp, WBC count Micro and abx as above 7-day course of meropenem and fluconazole completed yesterday DVT px: SCDs Monitor CBC intermittently Transfuse per usual guidelines Holding TF's in anticipation  of extubation Will leave NGT after extubation Repeat furosemide X 1 dose 05/14 Discontinue PAD protocol after extubation Monitor for signs of alcohol withdrawal after extubation   Wife updated over phone   CCM time: 35 mins The above time includes time spent in consultation with patient and/or family members and reviewing care plan on multidisciplinary rounds  Merton Border, MD PCCM service Mobile [phone] Pager [phone] 05/09/2019 12:10 PM

## 2019-05-09 NOTE — Progress Notes (Signed)
Placed patient back on rate and volume for rest. Spontaneous rate noted 38. Patient tolerated transition well.

## 2019-05-09 NOTE — Progress Notes (Signed)
Assisted tele visit to patient with daughter.  Leitha Hyppolite William, RN  

## 2019-05-09 NOTE — Progress Notes (Signed)
Pts wife arrived at bedside agreeable to one way extubation.  Pt extubated successfully currently on 2L via nasal canula, pt able to cough up secretions, O2 sats 91% and respiratory rate 20.  He opens his eyes spontaneously and is attempting to talk.  Pts wife at bedside post extubation all questions answered will continue to monitor and assess pt.  Sonda Rumble, AGNP  Pulmonary/Critical Care Pager [phone] (please enter 7 digits) PCCM Consult Pager [phone] (please enter 7 digits)

## 2019-05-09 NOTE — Progress Notes (Signed)
[facility] SURGICAL PROGRESS NOTE  Hospital Day(s): 9.   Post op day(s): 8 Days Post-Op.   Interval History: Patient seen and examined, remain intubated and sedated. Still with high output from surgical drain (>1.8L). Worsening leukocytosis to 23K. Hemoglobin stable. Discussion with RN, plan for extubation without re-intubation plans today.   Review of Systems:  Unable to perform secondary to intubation   Vital signs in last 24 hours: [min-max] current  Temp:  [98.5 F (36.9 C)-100.8 F (38.2 C)] 100 F (37.8 C) (05/14 0730) Pulse Rate:  [44-105] 95 (05/14 1000) Resp:  [23-29] 26 (05/14 1000) BP: (85-109)/(50-72) 103/61 (05/14 1000) SpO2:  [87 %-100 %] 100 % (05/14 1000) FiO2 (%):  [35 %] 35 % (05/14 0754)     Height: 6' (182.9 cm) Weight: 87.4 kg BMI (Calculated): 26.13   Intake/Output this shift:  Total I/O In: 115.8 [I.V.:115.8] Out: 700 [Urine:100; Drains:600]     Physical Exam:  Constitutional:Sedated Respiratory:Ventilated Cardiovascular:tachycardic Gastrointestinal: soft, non-distended, JP in left abdomen with high output, serosanguinous Genitourinary: Foley catheter in place Integumentary:Midline laparotomy incision is CDI with staples, no erythema or drainage   Labs:  CBC Latest Ref Rng & Units 05/09/2019 05/08/2019 05/07/2019  WBC 4.0 - 10.5 K/uL 23.3(H) 18.2(H) 14.7(H)  Hemoglobin 13.0 - 17.0 g/dL 5.0(V) 6.9(V) 9.4(I)  Hematocrit 39.0 - 52.0 % 29.4(L) 25.8(L) 25.9(L)  Platelets 150 - 400 K/uL 78(L) 82(L) 91(L)   CMP Latest Ref Rng & Units 05/09/2019 05/08/2019 05/07/2019  Glucose 70 - 99 mg/dL 016(P) 537(S) 827(M)  BUN 8 - 23 mg/dL 78(M) 75(Q) 23  Creatinine 0.61 - 1.24 mg/dL 4.92 0.10 0.71  Sodium 135 - 145 mmol/L 148(H) 146(H) 144  Potassium 3.5 - 5.1 mmol/L 5.1 3.7 3.7  Chloride 98 - 111 mmol/L 112(H) 109 108  CO2 22 - 32 mmol/L 31 30 26   Calcium 8.9 - 10.3 mg/dL 8.1(L) 8.3(L) 8.3(L)  Total Protein 6.5 - 8.1 g/dL 2.1(F) 4.6(L) -   Total Bilirubin 0.3 - 1.2 mg/dL 3.1(H) 3.8(H) -  Alkaline Phos 38 - 126 U/L 164(H) 155(H) -  AST 15 - 41 U/L 74(H) 85(H) -  ALT 0 - 44 U/L 42 46(H) -     Imaging studies: No new pertinent imaging studies   Assessment/Plan: (ICD-10's: K25.5) 73 y.o. male critically ill from cirrhosis and VDRF with worsening leukocytosis still ventilated and sedated 8 Days Post-Op s/p exploratory laparotomy and repair of perforatedduodenal (just immediately post-pyloric)ulcer with graham patch.    - Tube feeding as tolerating   - Monitor leukocytosis; hgb             - monitor bowel function - Monitor JP output; replete with albumin (6-8 gtram albumin per 1L output); remains serosanguinous. Output will remain high due to cirrhosis and ascites production, however, if JP is removed, ascites will simply leak from the hole and could potentially result in bacterial peritonitis from tracking.   - Plans for extubation today without re-intubation, appreciate PCCM assistance with this case    All of the above findings and recommendations were discussed with the medical team.  -- Lynden Oxford, PA-C [facility] 05/09/2019, 10:56 AM [phone] M-F: 7am - 4pm

## 2019-05-09 NOTE — Progress Notes (Signed)
Discussed code status and goals of treatment with pts wife Emeril Bellman via telephone.  She states the pt has previously expressed he would not want to be sustained on prolonged mechanical intubation if prognosis for functional recovery were poor.  Therefore, she has decided she would not want the pt reintubated if his respiratory status were to decline because he would not want a tracheostomy.  Therefore, code status changed to limited code: no mechanical ventilation/intubation.  Sonda Rumble, AGNP  Pulmonary/Critical Care Pager [phone] (please enter 7 digits) PCCM Consult Pager [phone] (please enter 7 digits)

## 2019-05-09 NOTE — TOC Progression Note (Signed)
Transition of Care [facility]) - Progression Note    Patient Details  Name: ERMAN IKNER MRN: [id number] Date of Birth: July 19, 1946  Transition [facility] CM/SW Contact  Allayne Butcher, RN Phone Number: 05/09/2019, 10:58 AM  Clinical Narrative:    Patient admitted to ICU from the OR after exploratory laparotomy and repair of perforated pyloric ulcer.  Patient has been in the ICU for 9 days failed extubation previously.  Patient remains intubated and sedated.  Plan for today is to extubate patient once the wife arrives.  The patient will not be re intubated.  RNCM will continue to follow patient progress.          Expected Discharge Plan and Services                                                 Social Determinants of Health (SDOH) Interventions    Readmission Risk Interventions No flowsheet data found.

## 2019-05-10 DIAGNOSIS — K7031 Alcoholic cirrhosis of liver with ascites: Secondary | ICD-10-CM

## 2019-05-10 LAB — COMPREHENSIVE METABOLIC PANEL
ALT: 37 U/L (ref 0–44)
AST: 67 U/L — ABNORMAL HIGH (ref 15–41)
Albumin: 2.5 g/dL — ABNORMAL LOW (ref 3.5–5.0)
Alkaline Phosphatase: 129 U/L — ABNORMAL HIGH (ref 38–126)
Anion gap: 7 (ref 5–15)
BUN: 52 mg/dL — ABNORMAL HIGH (ref 8–23)
CO2: 30 mmol/L (ref 22–32)
Calcium: 8.3 mg/dL — ABNORMAL LOW (ref 8.9–10.3)
Chloride: 104 mmol/L (ref 98–111)
Creatinine, Ser: 1.09 mg/dL (ref 0.61–1.24)
GFR calc Af Amer: >60 mL/min (ref >60)
GFR calc non Af Amer: >60 mL/min (ref >60)
Glucose, Bld: 163 mg/dL — ABNORMAL HIGH (ref 70–99)
Potassium: 4.6 mmol/L (ref 3.5–5.1)
Sodium: 141 mmol/L (ref 135–145)
Total Bilirubin: 4.1 mg/dL — ABNORMAL HIGH (ref 0.3–1.2)
Total Protein: 4.5 g/dL — ABNORMAL LOW (ref 6.5–8.1)

## 2019-05-10 LAB — AMMONIA: Ammonia: 33 umol/L (ref 9–35)

## 2019-05-10 LAB — CBC
HCT: 28.9 % — ABNORMAL LOW (ref 39.0–52.0)
Hemoglobin: 9.7 g/dL — ABNORMAL LOW (ref 13.0–17.0)
MCH: 34.4 pg — ABNORMAL HIGH (ref 26.0–34.0)
MCHC: 33.6 g/dL (ref 30.0–36.0)
MCV: 102.5 fL — ABNORMAL HIGH (ref 80.0–100.0)
Platelets: 73 10*3/uL — ABNORMAL LOW (ref 150–400)
RBC: 2.82 MIL/uL — ABNORMAL LOW (ref 4.22–5.81)
RDW: 17.8 % — ABNORMAL HIGH (ref 11.5–15.5)
WBC: 23.1 10*3/uL — ABNORMAL HIGH (ref 4.0–10.5)
nRBC: 0.1 % (ref 0.0–0.2)

## 2019-05-10 LAB — BRAIN NATRIURETIC PEPTIDE: B Natriuretic Peptide: 545 pg/mL — ABNORMAL HIGH (ref 0.0–100.0)

## 2019-05-10 MED ORDER — SENNOSIDES 8.8 MG/5ML PO SYRP
5.0000 mL | ORAL_SOLUTION | Freq: Every day | ORAL | Status: DC
  Administered 2019-05-10 – 2019-05-13 (×4): 5 mL
  Filled 2019-05-10 (×5): qty 5

## 2019-05-10 MED ORDER — ADULT MULTIVITAMIN LIQUID CH
15.0000 mL | Freq: Every day | ORAL | Status: DC
  Administered 2019-05-11 – 2019-05-15 (×5): 15 mL
  Filled 2019-05-10 (×7): qty 15

## 2019-05-10 MED ORDER — FUROSEMIDE 10 MG/ML IJ SOLN
20.0000 mg | Freq: Once | INTRAMUSCULAR | Status: AC
  Administered 2019-05-10: 20 mg via INTRAVENOUS
  Filled 2019-05-10: qty 2

## 2019-05-10 MED ORDER — DOCUSATE SODIUM 50 MG/5ML PO LIQD
100.0000 mg | Freq: Every day | ORAL | Status: DC
  Administered 2019-05-10 – 2019-05-13 (×4): 100 mg
  Filled 2019-05-10 (×4): qty 10

## 2019-05-10 MED ORDER — PIVOT 1.5 CAL PO LIQD
1000.0000 mL | ORAL | Status: DC
  Administered 2019-05-10 – 2019-05-11 (×2): 1000 mL
  Filled 2019-05-10: qty 1000

## 2019-05-10 NOTE — Consult Note (Signed)
Darren Miniumarren Alisyn Lequire, MD [facility]  [address] [place], [place] [address] Phone: [phone] Fax : [phone]  Consultation  Referring Provider:     Dr. Tim LairKassa Primary Care Physician:  Marisue IvanLinthavong, [place], MD Primary Gastroenterologist:  Gentry FitzUnassigned       Reason for Consultation:     Cirrhosis and ascites  Date of Admission:  05/03/2019 Date of Consultation:  05/10/2019         HPI:   Darren [name] is a 73 y.o. male Who was admitted on 564 abdominal pain and reported to be in the epigastric area.  The patient had a CT scan due to the thought that he may have an aortic dissection with an angiogram.  There was no dissection seen but there was free air and thickening of the first and second portion of duodenum consistent with a perforation.  The patient has a history of alcohol abuse. His lab work done in 2019 and 2018 showed a consistently elevated MCV. The patient also had a increased AST chronically with an increased alkaline phosphatase. The patient was admitted with significantly elevated liver enzymes with an AST of 136 and an ALT of 70 with alkaline phosphatase of 143 and total bilirubin of 2.3.  Initially the liver enzymes went up but have slowly trended down except for the bilirubin which seems to fluctuate. The patient is reported to drink a bottle of wine every day.  He is presently unable to give any history due to confusion and lethargy. The patient's hemoglobin has been stable and has actually started to increase.  The patient's platelets have trended downward. The CT done on admission also showed a shrunken liver with what appeared to be nodularity along the surface consistent with cirrhosis. At the time of surgery the patient had a percutaneous drain placed and he has had continuous ascites drainage from the site.  The nurse reports that the patient is putting out a liter per shift.  I'm now being asked to see the patient for cirrhosis and ascites.  Past Medical History:   Diagnosis Date  . Hypertension   . MI (myocardial infarction) (HCC) 2013      Prior to Admission medications   Medication Sig Start Date End Date Taking? Authorizing Provider  aspirin EC 81 MG tablet Take 81 mg by mouth daily.   Yes [provider]  atorvastatin (LIPITOR) 80 MG tablet Take 80 mg by mouth every evening. 03/10/19  Yes [provider]  Cholecalciferol (VITAMIN D-1000 MAX ST) 25 MCG (1000 UT) tablet Take 1,000 Units by mouth daily.   Yes [provider]  gabapentin (NEURONTIN) 300 MG capsule Take 600 mg by mouth 3 (three) times daily. 04/10/19  Yes [provider]  metoprolol succinate (TOPROL-XL) 25 MG 24 hr tablet Take 25 mg by mouth daily. 03/07/19  Yes [provider]  pyridOXINE (B-6) 50 MG tablet Take 50 mg by mouth daily.   Yes [provider]  ramipril (ALTACE) 5 MG capsule Take 5 mg by mouth daily. 04/08/19  Yes [provider]  thiamine (VITAMIN B-1) 100 MG tablet Take 100 mg by mouth daily.   Yes [provider]  vitamin B-12 (CYANOCOBALAMIN) 500 MCG tablet Take 500 mcg by mouth daily.   Yes [provider]  DULoxetine (CYMBALTA) 20 MG capsule Take 20 mg by mouth daily. 05/26/2019 05/30/19  [provider]    No family history on file.   Social History   Tobacco Use  .  Smoking status: Not on file  Substance Use Topics  . Alcohol use: Not on file  . Drug use: Not on file    Allergies as of 05/21/2019 - Review Complete 05/16/2019  Allergen Reaction Noted  . Penicillins Hives 10/03/2016    Review of Systems:    All systems reviewed and negative except where noted in HPI.   Physical Exam:  Vital signs in last 24 hours: Temp:  [97.8 F (36.6 C)-99.8 F (37.7 C)] 97.8 F (36.6 C) (05/15 1545) Pulse Rate:  [75-101] 99 (05/15 1545) Resp:  [20-28] 23 (05/15 1545) BP: (81-107)/(50-69) 98/57 (05/15 1515) SpO2:  [91 %-99 %] 93 % (05/15 1545) Weight:  [82.4 kg] 82.4 kg (05/15  1305) Last BM Date: 05/22/2019 General:  Lethargic and appears gravely ill Head:  Normocephalic and atraumatic. Eyes:   Positiveicterus.   Conjunctiva Yellow. PERRLA. Ears:  Normal auditory acuity. Neck:  Supple; no masses or thyroidomegaly Lungs: Respirations even and unlabored. Lungs clear to auscultation bilaterally.   No wheezes, crackles, or rhonchi.  Heart:  Regular rate and rhythm;  Without murmur, clicks, rubs or gallops Abdomen:  Soft, nondistended, nontender. Normal bowel sounds. No appreciable masses or hepatomegaly.  No rebound or guarding. There is a midline scar with staples and a right-sided drain protruding from the abdomen Rectal:  Not performed. Msk:  Symmetrical without gross deformities.   Extremities:  Without edema, cyanosis or clubbing. Neurologic:  Lethargic and unable to assess a neurological exam Skin:  Intact without significant lesions or rashes. Cervical Nodes:  No significant cervical adenopathy. Psych:  Alert and cooperative. Normal affect.  LAB RESULTS: Recent Labs    05/08/19 0433 05/09/19 0509 05/10/19 0422  WBC 18.2* 23.3* 23.1*  HGB 8.8* 9.5* 9.7*  HCT 25.8* 29.4* 28.9*  PLT 82* 78* 73*   BMET Recent Labs    05/08/19 0433 05/09/19 0509 05/10/19 0422  NA 146* 148* 141  K 3.7 5.1 4.6  CL 109 112* 104  CO2 GLUCOSE 214* 167* 163*  BUN 31* 43* 52*  CREATININE 0.61 1.00 1.09  CALCIUM 8.3* 8.1* 8.3*   LFT Recent Labs    05/10/19 0422  PROT 4.5*  ALBUMIN 2.5*  AST 67*  ALT 37  ALKPHOS 129*  BILITOT 4.1*   PT/INR No results for input(s): LABPROT, INR in the last 72 hours.  STUDIES: Dg Chest Port 1 View  Result Date: 05/09/2019 CLINICAL DATA:  Respiratory failure. EXAM: PORTABLE CHEST 1 VIEW COMPARISON:  Radiograph yesterday. FINDINGS: Endotracheal tube, enteric tube, and left central line remain in place. Slight improvement in bilateral diffuse reticular opacities. Improved retrocardiac aeration with decreasing  consolidation. No visualized pneumothorax. IMPRESSION: Slight improvement in diffuse bilateral reticular opacities. Improved retrocardiac aeration with decreasing left lung base consolidation. Electronically Signed   By: Narda Rutherford M.D.   On: 05/09/2019 03:16      Impression / Plan:   Assessment: and Plan: Active Problems:   Perforated viscus   Darren [name] is a 73 y.o. y/o male with what appears to be cirrhosis and ascites.  The patient has had significant drainage out of his percutaneous drain consistent with ascites. The patient's thrombocytopenia and abnormal liver enzymes with increased bilirubin are consistent with his imaging showing cirrhosis and his history of alcohol abuse.  The patient should be on a restricted sodium intake To decrease the amount of fluid in the abdomen.  Volume expanders to help with his hypotension and to keep the fluid intravascularly  may be helpful such as albumin. There is not a lot of room to work with the patient's diuretics since he is hypotensive and his creatinine has come up from 0.6 to 1.09. Alternatively the patient can be set up for a TIPS procedure which will decrease the portal pressure thereby decreasing the ascites. I will not make any changes to his medication at this time since the ICU team has been following his vital signs were closely and should determine when it is safe to increase the diuretics. If it is not controlled with diuretics then a TIPS procedure may be needed. The patient does not have a recent INR and his last INR was 2.2 on 5/6.  Using this INR and the most recent labs the patient's meld score is 21.  I will recheck an INR for tomorrow to see if we can get a more accurate meld score.    Thank you for involving me in the care of this patient.      LOS: 10 days   Darren Minium, MD  05/10/2019, 6:17 PM    Note: This dictation was prepared with Dragon dictation along with smaller phrase technology. Any transcriptional  errors that result from this process are unintentional.

## 2019-05-10 NOTE — Progress Notes (Signed)
PCCM PROGRESS NOTE   PT PROFILE/INITIAL PRESENTATION: 73 y.o. male admitted to ICU from the OR after exploratory laparotomy and repair of perforated pyloric ulcer.  Patient has history of alcohol abuse and intraoperative findings consistent with cirrhosis and significant ascites.  He remained intubated postoperatively. PMH of CAD, Htn.   CC  follow up resp failure  HPI Patient very weak Alert and awake Follows simple commands Increased RR High risk for aspiration Denies pain      MAJOR EVENTS/TEST RESULTS: 05/06 CT abd/pelvis: Free intraperitoneal air. There are 2 areas of abnormal bowel, within the 1st and 2nd portions of the duodenum where there is wall thickening and indistinctness concerning for peptic ulcer disease. Appearance of the liver is suspicious for cirrhosis. Moderate to large volume ascites 05/06 echocardiogram: LVEF 30-35%.  Severe hypokinesis of apical and periapical regions. 05/06 Admitted as above post op ex laparotomy with finding of perforated pyloric ulcer 05/07 Palliative Care consultation 05/08 Passed SBT and extubated.  Tolerated well initially.  Later in day, poor cough with excessive airway secretions.  Required reintubation.  Post intubation CXR consistent with aspiration (severe bilateral lower lobe airspace disease) 05/09 Full vent support with FiO2 65%.  Requiring vasopressors.  05/10 gas exchange improving.  Tolerates PS 10 cm H2O.  Vasopressor requirement improving. 05/11 UGI Xray: No leak demonstrated.  Tube feeds initiated. 05/12 tolerates pressure support ventilation.  Chest x-ray continues to work demonstrate ARDS versus pulmonary edema.  Furosemide ordered.  Tube feeds advanced. Amiodarone initiated for recurrent AF 05/13 Tolerated PS 10 cm H2O. Further diuresis with furosemide. Tolerating TFs - advanced to goal rate of 55 cc/hr. Goals of care conversation with wife - optimize resp status over next 24-48 hrs, then, one way extubation 05/14 RASS  -2. + F/C. NSR. Amiodarone transitioned to enteral - continue loading dose. Passed SBT. Plan one way extubation when wife arrives 5/15 very weak, high risk for aspiration, remains SD status  INDWELLING DEVICES: L radial A-line 05/06 >> 05/08 ETT 05/06 >> 05/08, 05/08 (reintubated) >> 5/14 extubated L IJ CVL 05/06 >>    MICRO DATA: SARS-CoV-2 5/5 >> NEG MRSA PCR 05/05 >> NEG Blood 05/05 >> NEG Resp culture 05/09 >> NEG  ANTIMICROBIALS:  Meropenem 05/06 >> 05/13 Fluconazole05/06 >> 05/13   BP (!) 98/57   Pulse 94   Temp 98.1 F (36.7 C) (Axillary)   Resp (!) 24   Ht 6' (1.829 m)   Wt 87.4 kg   SpO2 92%   BMI 26.13 kg/m   Output by Drain (mL) 05/08/19 0701 - 05/08/19 1900 05/08/19 1901 - 05/09/19 0700 05/09/19 0701 - 05/09/19 1900 05/09/19 1901 - 05/10/19 0700 05/10/19 0701 - 05/10/19 0846  Closed System Drain Superior;Medial Abdomen Bulb (JP) 19 Fr. 1075 790 1270 1250 150     Intake/Output Summary (Last 24 hours) at 05/10/2019 0844 Last data filed at 05/10/2019 [id number] Gross per 24 hour  Intake 72.04 ml  Output 3870 ml  Net -3797.96 ml        Review of Systems:  Gen:  Denies  fever, sweats, chills weigh loss  HEENT: Denies blurred vision, double vision, ear pain, eye pain, hearing loss, nose bleeds, sore throat Cardiac:  No dizziness, chest pain or heaviness, chest tightness,edema, No JVD Resp:   No cough, -sputum production, -shortness of breath,-wheezing, -hemoptysis,  Gi: Denies swallowing difficulty, stomach pain, nausea or vomiting, diarrhea, constipation, bowel incontinence Other:  All other systems negative   Physical Examination:   GENERAL:NAD, no fevers,  chills, +weakness  HEAD: Normocephalic, atraumatic.  EYES: PERLA, EOMI No scleral icterus.  MOUTH: Moist mucosal membrane.  EAR, NOSE, THROAT: Clear without exudates. No external lesions.  NECK: Supple. No thyromegaly.  No JVD.  PULMONARY: CTA B/L +rhonchi CARDIOVASCULAR: S1 and S2. Regular rate  and rhythm. No murmurs GASTROINTESTINAL: Soft, nontender, nondistended. Positive bowel sounds.  MUSCULOSKELETAL: +edema.  NEUROLOGIC: No gross focal neurological deficits. 5/5 strength all extremities SKIN: No ulceration, lesions, rashes, or cyanosis.  PSYCHIATRIC: poor Insight,  ALL OTHER ROS ARE NEGATIVE     CBC    Component Value Date/Time   WBC 23.1 (H) 05/10/2019 0422   RBC 2.82 (L) 05/10/2019 0422   HGB 9.7 (L) 05/10/2019 0422   HGB 11.8 (L) 08/29/2012 0428   HCT 28.9 (L) 05/10/2019 0422   HCT 34.6 (L) 08/29/2012 0428   PLT 73 (L) 05/10/2019 0422   PLT 200 08/29/2012 0428   MCV 102.5 (H) 05/10/2019 0422   MCV 94 08/29/2012 0428   MCH 34.4 (H) 05/10/2019 0422   MCHC 33.6 05/10/2019 0422   RDW 17.8 (H) 05/10/2019 0422   RDW 13.7 08/29/2012 0428   LYMPHSABS 1.1 05/03/2019 0455   LYMPHSABS 2.1 08/29/2012 0428   MONOABS 1.0 05/03/2019 0455   MONOABS 0.9 08/29/2012 0428   EOSABS 0.2 05/03/2019 0455   EOSABS 0.7 08/29/2012 0428   BASOSABS 0.0 05/03/2019 0455   BASOSABS 0.1 08/29/2012 0428   BMP Latest Ref Rng & Units 05/10/2019 05/09/2019 05/08/2019  Glucose 70 - 99 mg/dL 470(L) 295(F) 473(U)  BUN 8 - 23 mg/dL 03(J) 09(U) 43(C)  Creatinine 0.61 - 1.24 mg/dL 3.81 8.40 3.75  Sodium 135 - 145 mmol/L 141 148(H) 146(H)  Potassium 3.5 - 5.1 mmol/L 4.6 5.1 3.7  Chloride 98 - 111 mmol/L 104 112(H) 109  CO2 22 - 32 mmol/L 30 31 30   Calcium 8.9 - 10.3 mg/dL 8.3(L) 8.1(L) 8.3(L)     Cardiac Panel (last 3 results) No results for input(s): CKTOTAL, CKMB, TROPONINI, RELINDX in the last 72 hours.  BNP (last 3 results) Recent Labs    05/06/19 0414 05/09/19 0509 05/10/19 0422  BNP 3,964.0* 1,728.0* 545.0*    ProBNP (last 3 results) No results for input(s): PROBNP in the last 8760 hours.  CXR-improved Pulm edema I have Independently reviewed images  Interpretation:improved pulm edema    IMPRESSION:   S/P emergent laparotomy for perforated pyloric ulcer with  postoperative shock and ventilator dependence  Severe ACUTE Hypoxic extubated But very high risk for aspiration Oxygen as needed CHEST PT BD therapy as needed  CARDIAC FAILURE-EF 30% -oxygen as needed -Lasix as tolerated -follow up cardiac enzymes as indicated Follow up cardiology recs Amiodarone infusion   LIVER CIRRHOSIS Follow CMP as needed  ELECTROLYTES -follow labs as needed -replace as needed -pharmacy consultation and following    DVT/GI PRX ordered TRANSFUSIONS AS NEEDED MONITOR FSBS ASSESS the need for LABS as needed   GI GI PROPHYLAXIS as indicated  NUTRITIONAL STATUS DIET-->hold TF's for now Continue NGT for now  Lucie Leather, M.D.  [facility]  Medical Director [facility] Medical Director [facility] Cardio-Pulmonary Department

## 2019-05-10 NOTE — TOC Progression Note (Signed)
Transition of Care [facility]) - Progression Note    Patient Details  Name: Darren [name] MRN: [id number] Date of Birth: 1946-05-07  Transition of Care [facility]) CM/SW Contact  Barrie Dunker, RN Phone Number: 05/10/2019, 2:30 PM  Clinical Narrative:     Patient was extubated yesterday, not to be reintubated, the spouse was to come today Chest PT is ongoing every 6 hours, Being fed trickle feed thru NG tube, continue to monitor for needs  Expected Discharge Plan: (unsure of disposition) Barriers to Discharge: Continued Medical Work up  Expected Discharge Plan and Services Expected Discharge Plan: (unsure of disposition)                                               Social Determinants of Health (SDOH) Interventions    Readmission Risk Interventions No flowsheet data found.

## 2019-05-10 NOTE — Progress Notes (Signed)
Pt now on nonrebreather after pt's sats began to drop after deep suctioning.  Pt's O2 level was increased to 6L n/c   with no improvement in oxygen saturation.  RT was called and pt was suctioned then placed on nonrebreather. Pt's O2 sats now are 98% and pt is resting comfortably.

## 2019-05-10 NOTE — Progress Notes (Signed)
Assisted tele visit to patient with son.  Darren Justman M Payden Bonus, RN   

## 2019-05-10 NOTE — Progress Notes (Signed)
[facility] SURGICAL PROGRESS NOTE  Hospital Day(s): 10.   Post op day(s): 9 Days Post-Op.   Interval History: Patient seen and examined, patient extubated yesterday without plans for re-intubation. Patient is alert this morning and on [place]. He shakes his head to answer a few questions but otherwise appears very weak. He denied any abdominal pain. Still having high output from JP drain (~2.5L in last 24 hours). Tube feedings being held due to high aspiration risk   Review of Systems:  Unable to obtain: patient participation   Vital signs in last 24 hours: [min-max] current  Temp:  [98 F (36.7 C)-99.8 F (37.7 C)] 98.1 F (36.7 C) (05/15 0844) Pulse Rate:  [75-101] 94 (05/15 0844) Resp:  [18-28] 24 (05/15 0844) BP: (81-107)/(50-70) 98/57 (05/15 0800) SpO2:  [91 %-99 %] 92 % (05/15 0844) FiO2 (%):  [35 %] 35 % (05/14 1044)     Height: 6' (182.9 cm) Weight: 87.4 kg BMI (Calculated): 26.13   Intake/Output this shift:  Total I/O In: -  Out: 150 [Drains:150]     Physical Exam:  Constitutional: alert, appears very weak Respiratory: breathing non-labored at rest, on [place]  Gastrointestinal: soft, non-tender, and non-distended. JP in RUQ with high output, serosanguinous Genitourinary: Foley catheter in place Integumentary:Midline laparotomy incision is CDI with staples, no erythema or drainage   Labs:  CBC Latest Ref Rng & Units 05/10/2019 05/09/2019 05/08/2019  WBC 4.0 - 10.5 K/uL 23.1(H) 23.3(H) 18.2(H)  Hemoglobin 13.0 - 17.0 g/dL 1.6(X9.7(L) 0.9(U9.5(L) 0.4(V8.8(L)  Hematocrit 39.0 - 52.0 % 28.9(L) 29.4(L) 25.8(L)  Platelets 150 - 400 K/uL 73(L) 78(L) 82(L)   CMP Latest Ref Rng & Units 05/10/2019 05/09/2019 05/08/2019  Glucose 70 - 99 mg/dL 409(W163(H) 119(J167(H) 478(G214(H)  BUN 8 - 23 mg/dL 95(A52(H) 21(H43(H) 08(M31(H)  Creatinine 0.61 - 1.24 mg/dL 5.781.09 4.691.00 6.290.61  Sodium 135 - 145 mmol/L 141 148(H) 146(H)  Potassium 3.5 - 5.1 mmol/L 4.6 5.1 3.7  Chloride 98 - 111 mmol/L 104 112(H) 109  CO2 22 -  32 mmol/L 30 31 30   Calcium 8.9 - 10.3 mg/dL 8.3(L) 8.1(L) 8.3(L)  Total Protein 6.5 - 8.1 g/dL 5.2(W4.5(L) 4.5(L) 4.6(L)  Total Bilirubin 0.3 - 1.2 mg/dL 4.1(H) 3.1(H) 3.8(H)  Alkaline Phos 38 - 126 U/L 129(H) 164(H) 155(H)  AST 15 - 41 U/L 67(H) 74(H) 85(H)  ALT 0 - 44 U/L 37 42 46(H)     Imaging studies: No new pertinent imaging studies   Assessment/Plan: (ICD-10's: K25.5) 73 y.o. male critically ill from cirrhosis/respiratory status with stable anemia but persistently elevated leukocytosis (23.1K) otherwise from a surgical standpoint is doing better 9 Days Post-Op s/p exploratory laparotomy and repair of perforatedduodenal (just immediately post-pyloric)ulcer with graham patch.   - Discussed case with PCCM MD (Dr Belia HemanKasa)   - Will remove NGT and consult SLP to evaluate for PO intake   - discussed management of ascites, plan for PCCM to discuss with GI   - Staples to stay in for at least 14 days post-op  - Monitor leukocytosis; hgb; electrolytes - monitor bowel function - Monitor JP output; replete with albumin (6-8 gtram albumin per 1L output); remains serosanguinous. Output will remain high due to cirrhosis and ascites production, however, if JP is removed, ascites will simply leak from the hole and could potentially result in bacterial peritonitis from tracking.    - Further management per PCCM team; appreciate their help  All of the above findings and recommendations were discussed with the patient, and  the medical team, and all of the medical teams questions were addressed and answered.   -- Lynden Oxford, PA-C [facility] 05/10/2019, 10:21 AM [phone] M-F: 7am - 4pm

## 2019-05-10 NOTE — Progress Notes (Signed)
Nutrition Follow-up  RD working remotely.  DOCUMENTATION CODES:   Not applicable  INTERVENTION:   Initiate Pivot 1.5 Cal at trickle rate of 20 mL/hr per MD  Once medically appropriate, can begin advancing by 15 mL/hr every 8 hours to goal rate 60 mL/hr. Provides 2160 kcal, 135 grams of protein, 1093 mL H2O daily.  Free water flush of 200 mL Q6hrs per MD  Goal TF regimen meets 100% RDIs for vitamins/minerals.  MVI, thiamine and folic acid in setting of etoh abuse (can d/c MVI once pt at goal rate)  Monitor magnesium, potassium, and phosphorus daily for at least 3 days, MD to replete as needed, as pt is at risk for refeeding syndrome.  NUTRITION DIAGNOSIS:   Inadequate oral intake related to acute illness as evidenced by NPO status.  Ongoing.  GOAL:   Patient will meet greater than or equal to 90% of their needs  Progressing with initiation of nutrition support.  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 Pt also found to have new liver cirrhosis with 2L ascites noted during surgery    Pt s/p one way extubation 5/14; tolerating well. Plan is to start trickle feeds via NGT today. May need to consider post-pyloric NGT placement if there is concern for aspiration. Per MD, plan is to reassess daily to determine whether or not to remove NGT. No new weight since 5/9; will request weekly weights.   Medications reviewed and include: colace, folic acid, protonix, senokot, thiamine   Labs reviewed: K 4.6 wnl, BUN 52(H), alk phos 129(H), AST 67(H), tbili 4.1(H) BNP- 545(H) Wbc- 23.1(H), Hgb 9.7(L), Hct 28.9(L), MCV 102.5(H), MCH 34.4(H)  Diet Order:   Diet Order    None     EDUCATION NEEDS:   No education needs have been identified at this  time  Skin:  Skin Assessment: Skin Integrity Issues:(ecchymosis; closed incision to abdomen)  Last BM:  04/26/2019 per chart  Height:   Ht Readings from Last 1 Encounters:  05/16/2019 6' (1.829 m)   Weight:   Wt Readings from Last 1 Encounters:  05/04/19 87.4 kg   Ideal Body Weight:  80.9 kg  BMI:  Body mass index is 26.13 kg/m.  Estimated Nutritional Needs:   Kcal:  2200-2500kcal/day   Protein:  110-125g/day   Fluid:  >2.2L/day   Darren Distance MS, RD, LDN Pager #- [phone] Office#- [phone] After Hours Pager: [phone]

## 2019-05-11 ENCOUNTER — Inpatient Hospital Stay: Payer: Medicare HMO

## 2019-05-11 LAB — CBC WITH DIFFERENTIAL/PLATELET
Abs Immature Granulocytes: 0.31 10*3/uL — ABNORMAL HIGH (ref 0.00–0.07)
Basophils Absolute: 0 10*3/uL (ref 0.0–0.1)
Basophils Relative: 0 %
Eosinophils Absolute: 0.1 10*3/uL (ref 0.0–0.5)
Eosinophils Relative: 1 %
HCT: 28.9 % — ABNORMAL LOW (ref 39.0–52.0)
Hemoglobin: 9.9 g/dL — ABNORMAL LOW (ref 13.0–17.0)
Immature Granulocytes: 1 %
Lymphocytes Relative: 5 %
Lymphs Abs: 1.3 10*3/uL (ref 0.7–4.0)
MCH: 35.6 pg — ABNORMAL HIGH (ref 26.0–34.0)
MCHC: 34.3 g/dL (ref 30.0–36.0)
MCV: 104 fL — ABNORMAL HIGH (ref 80.0–100.0)
Monocytes Absolute: 1.8 10*3/uL — ABNORMAL HIGH (ref 0.1–1.0)
Monocytes Relative: 7 %
Neutro Abs: 20.4 10*3/uL — ABNORMAL HIGH (ref 1.7–7.7)
Neutrophils Relative %: 86 %
Platelets: 100 10*3/uL — ABNORMAL LOW (ref 150–400)
RBC: 2.78 MIL/uL — ABNORMAL LOW (ref 4.22–5.81)
RDW: 17.9 % — ABNORMAL HIGH (ref 11.5–15.5)
WBC: 23.9 10*3/uL — ABNORMAL HIGH (ref 4.0–10.5)
nRBC: 0.1 % (ref 0.0–0.2)

## 2019-05-11 LAB — COMPREHENSIVE METABOLIC PANEL
ALT: 35 U/L (ref 0–44)
AST: 71 U/L — ABNORMAL HIGH (ref 15–41)
Albumin: 2.5 g/dL — ABNORMAL LOW (ref 3.5–5.0)
Alkaline Phosphatase: 140 U/L — ABNORMAL HIGH (ref 38–126)
Anion gap: 8 (ref 5–15)
BUN: 57 mg/dL — ABNORMAL HIGH (ref 8–23)
CO2: 30 mmol/L (ref 22–32)
Calcium: 8.6 mg/dL — ABNORMAL LOW (ref 8.9–10.3)
Chloride: 106 mmol/L (ref 98–111)
Creatinine, Ser: 1.23 mg/dL (ref 0.61–1.24)
GFR calc Af Amer: >60 mL/min (ref >60)
GFR calc non Af Amer: 59 mL/min — ABNORMAL LOW (ref >60)
Glucose, Bld: 168 mg/dL — ABNORMAL HIGH (ref 70–99)
Potassium: 4.4 mmol/L (ref 3.5–5.1)
Sodium: 144 mmol/L (ref 135–145)
Total Bilirubin: 4.3 mg/dL — ABNORMAL HIGH (ref 0.3–1.2)
Total Protein: 4.7 g/dL — ABNORMAL LOW (ref 6.5–8.1)

## 2019-05-11 LAB — PROTIME-INR
INR: 1.4 — ABNORMAL HIGH (ref 0.8–1.2)
Prothrombin Time: 17.4 seconds — ABNORMAL HIGH (ref 11.4–15.2)

## 2019-05-11 MED ORDER — ALBUMIN HUMAN 25 % IV SOLN
12.5000 g | Freq: Once | INTRAVENOUS | Status: AC
  Administered 2019-05-11: 12.5 g via INTRAVENOUS
  Filled 2019-05-11: qty 50

## 2019-05-11 NOTE — Progress Notes (Signed)
SURGICAL PROGRESS NOTE   Hospital Day(s): 11.   Post op day(s): 10 Days Post-Op.   Interval History: Patient seen and examined.  Continue very weak.  Patient unable to give history or any interval changes in his status.  Stable discussed with Dr. Belia Heman and there has been any changes since last evaluation.  Plan is to continue optimizing patient regarding nutrition, pulmonary wise, physical therapy, following therapy, and others.  Vital signs in last 24 hours: [min-max] current  Temp:  [97.8 F (36.6 C)-99.6 F (37.6 C)] 99.6 F (37.6 C) (05/16 0800) Pulse Rate:  [93-101] 98 (05/16 0630) Resp:  [18-26] 19 (05/16 0700) BP: (87-106)/(52-70) 106/63 (05/16 0700) SpO2:  [90 %-100 %] 94 % (05/16 0717) Weight:  [76.2 kg-82.4 kg] 76.2 kg (05/16 0400)     Height: 6' (182.9 cm) Weight: 76.2 kg BMI (Calculated): 22.78   Physical Exam:  Constitutional: alert, cooperative and no distress  Respiratory: breathing non-labored at rest  Cardiovascular: regular rate and sinus rhythm  Gastrointestinal: soft, non-tender, and non-distended.  Wound is dry and clean.  Labs:  CBC Latest Ref Rng & Units 05/11/2019 05/10/2019 05/09/2019  WBC 4.0 - 10.5 K/uL 23.9(H) 23.1(H) 23.3(H)  Hemoglobin 13.0 - 17.0 g/dL 8.3(J) 8.2(N) 0.5(L)  Hematocrit 39.0 - 52.0 % 28.9(L) 28.9(L) 29.4(L)  Platelets 150 - 400 K/uL 100(L) 73(L) 78(L)   CMP Latest Ref Rng & Units 05/11/2019 05/10/2019 05/09/2019  Glucose 70 - 99 mg/dL 976(B) 341(P) 379(K)  BUN 8 - 23 mg/dL 24(O) 97(D) 53(G)  Creatinine 0.61 - 1.24 mg/dL 9.92 4.26 8.34  Sodium 135 - 145 mmol/L 144 141 148(H)  Potassium 3.5 - 5.1 mmol/L 4.4 4.6 5.1  Chloride 98 - 111 mmol/L 106 104 112(H)  CO2 22 - 32 mmol/L 30 30 31   Calcium 8.9 - 10.3 mg/dL 1.9(Q) 8.3(L) 8.1(L)  Total Protein 6.5 - 8.1 g/dL 4.7(L) 4.5(L) 4.5(L)  Total Bilirubin 0.3 - 1.2 mg/dL 4.3(H) 4.1(H) 3.1(H)  Alkaline Phos 38 - 126 U/L 140(H) 129(H) 164(H)  AST 15 - 41 U/L 71(H) 67(H) 74(H)  ALT 0 - 44 U/L  35 37 42    Imaging studies: No new pertinent imaging studies   Assessment/Plan:  73 y.o. male critically ill from cirrhosis/respiratory status with stable anemia but persistently elevated leukocytosis (23.9K) otherwise from a surgical standpoint is stable 10 Days Post-Op s/p exploratory laparotomy and repair of perforatedduodenal (just immediately post-pyloric)ulcer with graham patch. Patient critically ill and extremely weak.  He was on tube feedings and tolerating low rate regularly.  There is no sign of nausea or vomiting.  He continues with persistent elevation of white blood cell count.  There is no fever.  I evaluated the x-ray which is consistent with suspected aspiration but improving from previous x-rays.  There is no sign of significant pneumonia that explain the leukocytosis.  If urinalysis or any other source of infection is are not found, will recommend abdomen and pelvis CT scan for evaluation of intra-abdominal abscess.  Blood cultures are negative 1-week ago.  Agree with current management, trial of tube feedings, appreciate GI input regarding ascites management.  Agree to continue management by critical care physician and recommendations.  Gae Gallop, MD

## 2019-05-11 NOTE — Progress Notes (Signed)
Resting in bed. Responds to voice. Denies pain at this this time.

## 2019-05-11 NOTE — Progress Notes (Signed)
Assisted tele visit to patient with family member.  Reiley Bertagnolli M, RN  

## 2019-05-11 NOTE — Progress Notes (Signed)
CRITICAL CARE NOTE  CC  follow up respiratory failure  SUBJECTIVE Patient remains critically ill Prognosis is guarded High risk for aspiration  More somnelant Patient not able to move neck Seems to have neck mass and enlarged thyroid cartlage Will obatin CT head and NECK   BP 105/67   Pulse (!) 101   Temp 99.5 F (37.5 C) (Oral)   Resp (!) 23   Ht 6' (1.829 m)   Wt 76.2 kg   SpO2 91%   BMI 22.78 kg/m    I/O last 3 completed shifts: In: 1768 [NG/GT:1768] Out: 5195 [Urine:1875; Drains:3320] Total I/O In: -  Out: 275 [Urine:75; Drains:200]  SpO2: 91 % O2 Flow Rate (L/min): 3 L/min FiO2 (%): 35 %   SIGNIFICANT EVENTS MAJOR EVENTS/TEST RESULTS: 05/06 CT abd/pelvis: Free intraperitoneal air. There are 2 areas of abnormal bowel, within the 1st and 2nd portions of the duodenum where there is wall thickening and indistinctness concerning for peptic ulcer disease. Appearance of the liver is suspicious for cirrhosis. Moderate to large volume ascites 05/06 echocardiogram: LVEF 30-35%.  Severe hypokinesis of apical and periapical regions. 05/06 Admitted as above post op ex laparotomy with finding of perforated pyloric ulcer 05/07 Palliative Care consultation 05/08 Passed SBT and extubated.  Tolerated well initially.  Later in day, poor cough with excessive airway secretions.  Required reintubation.  Post intubation CXR consistent with aspiration (severe bilateral lower lobe airspace disease) 05/09 Full vent support with FiO2 65%.  Requiring vasopressors.  05/10 gas exchange improving.  Tolerates PS 10 cm H2O.  Vasopressor requirement improving. 05/11 UGI Xray: No leak demonstrated.  Tube feeds initiated. 05/12 tolerates pressure support ventilation.  Chest x-ray continues to work demonstrate ARDS versus pulmonary edema.  Furosemide ordered.  Tube feeds advanced. Amiodarone initiated for recurrent AF 05/13 Tolerated PS 10 cm H2O. Further diuresis with furosemide. Tolerating TFs -  advanced to goal rate of 55 cc/hr. Goals of care conversation with wife - optimize resp status over next 24-48 hrs, then, one way extubation 05/14 RASS -2. + F/C. NSR. Amiodarone transitioned to enteral - continue loading dose. Passed SBT. Plan one way extubation when wife arrives 5/15 very weak, high risk for aspiration, remains SD status 5/16 work up neck mass-CT head/neck, more critical today  REVIEW OF SYSTEMS  PATIENT IS UNABLE TO PROVIDE COMPLETE REVIEW OF SYSTEMS DUE TO SEVERE CRITICAL ILLNESS   PHYSICAL EXAMINATION:  GENERAL:critically ill appearing,  HEAD: Normocephalic, atraumatic.  EYES: Pupils equal, round, reactive to light.  No scleral icterus.  MOUTH: Moist mucosal membrane. NECK: Supple. No thyromegaly. No nodules. No JVD.  PULMONARY: +rhonchi, +wheezing CARDIOVASCULAR: S1 and S2. Regular rate and rhythm. No murmurs, rubs, or gallops.  GASTROINTESTINAL: Soft, nontender, -distended. No masses. Positive bowel sounds. No hepatosplenomegaly.  MUSCULOSKELETAL: No swelling, clubbing, or edema.  NEUROLOGIC: obtunded SKIN:intact,warm,dry  MEDICATIONS: I have reviewed all medications and confirmed regimen as documented   CULTURE RESULTS   Recent Results (from the past 240 hour(s))  MRSA PCR Screening     Status: None   Collection Time: 05/02/19 12:49 PM  Result Value Ref Range Status   MRSA by PCR NEGATIVE NEGATIVE Final    Comment:        The GeneXpert MRSA Assay (FDA approved for NASAL specimens only), is one component of a comprehensive MRSA colonization surveillance program. It is not intended to diagnose MRSA infection nor to guide or monitor treatment for MRSA infections. Performed at [facility], [address] [place],  [place] [address]   Culture, respiratory (non-expectorated)     Status: None   Collection Time: 05/04/19  8:40 AM  Result Value Ref Range Status   Specimen Description   Final    TRACHEAL ASPIRATE Performed at [facility], [address] [place], [place] [address]    Special Requests   Final    NONE Performed at [facility], [address] [place], [place] [address]    Gram Stain   Final    MODERATE WBC PRESENT, PREDOMINANTLY PMN NO SQUAMOUS EPITHELIAL CELLS SEEN NO ORGANISMS SEEN    Culture   Final    NO GROWTH 2 DAYS Performed at [facility], [address], [place], [place] [address]    Report Status 05/07/2019 FINAL  Final         Indwelling Urinary Catheter continued, requirement due to   Reason to continue Indwelling Urinary Catheter strict Intake/Output monitoring for hemodynamic instability   Central Line/ continued, requirement due to  Reason to continue [employer] Monitoring of central venous pressure or other hemodynamic parameters and poor IV access   Ventilator continued, requirement due to severe respiratory failure   Ventilator Sedation RASS 0 to -2      ASSESSMENT AND PLAN SYNOPSIS S/P emergent laparotomy for perforated pyloric ulcer with postoperative shock complicated by severe sCHF and severe liver cirrhosis   Severe ACUTE Hypoxic and Hypercapnic Respiratory Failure Resolving but very high risk for aspiration Patient is DNI Oxygen as needed  ACUTE SYSTOLIC CARDIAC FAILURE- EF 30% -oxygen as needed -Lasix as tolerated -follow up cardiac enzymes as indicated -follow up cardiology recs    ACUTE KIDNEY INJURY/Renal Failure -follow chem 7 -follow UO -continue Foley Catheter-assess need    NEUROLOGY More somnolent CT head pending Will check ammonia levels    SHOCK-/HYPOVOLUMIC/CARDIOGENIC Very poor malnutrition -use vasopressors to keep MAP>65 if needed   CARDIAC ICU monitoring   GI GI PROPHYLAXIS as indicated  NUTRITIONAL STATUS DIET-->TF's as tolerated Constipation protocol as indicated GI consult input appreciated  ENDO - will use ICU hypoglycemic\Hyperglycemia protocol if  indicated   ELECTROLYTES -follow labs as needed -replace as needed -pharmacy consultation and following   DVT/GI PRX ordered TRANSFUSIONS AS NEEDED MONITOR FSBS ASSESS the need for LABS as needed   Critical Care Time devoted to patient care services described in this note is 43 minutes.   Overall, patient is critically ill, prognosis is guarded.  Patient with Multiorgan failure and at high risk for cardiac arrest and death.   Patient is DNI  Lucie Leather, M.D.  [facility]  Medical Director [facility] Medical Director [facility] Cardio-Pulmonary Department

## 2019-05-12 LAB — COMPREHENSIVE METABOLIC PANEL
ALT: 34 U/L (ref 0–44)
AST: 62 U/L — ABNORMAL HIGH (ref 15–41)
Albumin: 2.6 g/dL — ABNORMAL LOW (ref 3.5–5.0)
Alkaline Phosphatase: 139 U/L — ABNORMAL HIGH (ref 38–126)
Anion gap: 9 (ref 5–15)
BUN: 65 mg/dL — ABNORMAL HIGH (ref 8–23)
CO2: 29 mmol/L (ref 22–32)
Calcium: 8.6 mg/dL — ABNORMAL LOW (ref 8.9–10.3)
Chloride: 107 mmol/L (ref 98–111)
Creatinine, Ser: 1.38 mg/dL — ABNORMAL HIGH (ref 0.61–1.24)
GFR calc Af Amer: 60 mL/min — ABNORMAL LOW (ref >60)
GFR calc non Af Amer: 51 mL/min — ABNORMAL LOW (ref >60)
Glucose, Bld: 163 mg/dL — ABNORMAL HIGH (ref 70–99)
Potassium: 3.5 mmol/L (ref 3.5–5.1)
Sodium: 145 mmol/L (ref 135–145)
Total Bilirubin: 3.6 mg/dL — ABNORMAL HIGH (ref 0.3–1.2)
Total Protein: 4.8 g/dL — ABNORMAL LOW (ref 6.5–8.1)

## 2019-05-12 LAB — CBC WITH DIFFERENTIAL/PLATELET
Abs Immature Granulocytes: 0.22 10*3/uL — ABNORMAL HIGH (ref 0.00–0.07)
Basophils Absolute: 0 10*3/uL (ref 0.0–0.1)
Basophils Relative: 0 %
Eosinophils Absolute: 0.2 10*3/uL (ref 0.0–0.5)
Eosinophils Relative: 1 %
HCT: 29.6 % — ABNORMAL LOW (ref 39.0–52.0)
Hemoglobin: 9.8 g/dL — ABNORMAL LOW (ref 13.0–17.0)
Immature Granulocytes: 1 %
Lymphocytes Relative: 7 %
Lymphs Abs: 1.3 10*3/uL (ref 0.7–4.0)
MCH: 33.3 pg (ref 26.0–34.0)
MCHC: 33.1 g/dL (ref 30.0–36.0)
MCV: 100.7 fL — ABNORMAL HIGH (ref 80.0–100.0)
Monocytes Absolute: 1.8 10*3/uL — ABNORMAL HIGH (ref 0.1–1.0)
Monocytes Relative: 9 %
Neutro Abs: 15.9 10*3/uL — ABNORMAL HIGH (ref 1.7–7.7)
Neutrophils Relative %: 82 %
Platelets: 129 10*3/uL — ABNORMAL LOW (ref 150–400)
RBC: 2.94 MIL/uL — ABNORMAL LOW (ref 4.22–5.81)
RDW: 17.6 % — ABNORMAL HIGH (ref 11.5–15.5)
WBC: 19.4 10*3/uL — ABNORMAL HIGH (ref 4.0–10.5)
nRBC: 0 % (ref 0.0–0.2)

## 2019-05-12 MED ORDER — PIVOT 1.5 CAL PO LIQD
1000.0000 mL | ORAL | Status: DC
  Administered 2019-05-13: 11:00:00 1000 mL
  Filled 2019-05-12: qty 1000

## 2019-05-12 MED ORDER — DIAZEPAM 2 MG PO TABS
2.0000 mg | ORAL_TABLET | Freq: Four times a day (QID) | ORAL | Status: DC | PRN
  Administered 2019-05-12 – 2019-05-14 (×3): 2 mg via ORAL
  Filled 2019-05-12 (×3): qty 1

## 2019-05-12 NOTE — Progress Notes (Signed)
Patient repositioned and desated to the mid 80s.  Patient's nasal cannula increased and ineffective.  Patient placed on non-rebreather and oxygen saturation increased to 100%.  Tube feedings placed on hold at this time due to patient sounding rhonchorus. MD aware, orders received for valium for neck spasms and labs.

## 2019-05-12 NOTE — Progress Notes (Signed)
SURGICAL PROGRESS NOTE   Hospital Day(s): 12.   Post op day(s): 11 Days Post-Op.   Interval History: Patient seen and examined, no acute events or new complaints overnight. Patient not talkative.  Patient does follow voice commands which is fusion.  Not sure if this is due to the severe weakness or some type of delirium/depressive state.  Vital signs in last 24 hours: [min-max] current  Temp:  [98 F (36.7 C)-100 F (37.8 C)] 98.1 F (36.7 C) (05/17 0400) Pulse Rate:  [90-107] 90 (05/17 0700) Resp:  [16-34] 23 (05/17 0700) BP: (98-122)/(52-94) 112/53 (05/17 0700) SpO2:  [90 %-98 %] 91 % (05/17 0744)     Height: 6' (182.9 cm) Weight: 76.2 kg BMI (Calculated): 22.78   Drain output: 1920 mL in last 24 hours (serous)  Physical Exam:  Constitutional: Arousable, no distress, severely weak Respiratory: breathing non-labored at rest  Cardiovascular: regular rate and sinus rhythm  Gastrointestinal: soft, non-tender, and non-distended.  Wound is dry and clean  Labs:  CBC Latest Ref Rng & Units 05/11/2019 05/10/2019 05/09/2019  WBC 4.0 - 10.5 K/uL 23.9(H) 23.1(H) 23.3(H)  Hemoglobin 13.0 - 17.0 g/dL 3.0(D) 3.1(Y) 3.8(O)  Hematocrit 39.0 - 52.0 % 28.9(L) 28.9(L) 29.4(L)  Platelets 150 - 400 K/uL 100(L) 73(L) 78(L)   CMP Latest Ref Rng & Units 05/11/2019 05/10/2019 05/09/2019  Glucose 70 - 99 mg/dL 875(Z) 972(Q) 206(O)  BUN 8 - 23 mg/dL 15(I) 15(P) 79(K)  Creatinine 0.61 - 1.24 mg/dL 3.27 6.14 7.09  Sodium 135 - 145 mmol/L 144 141 148(H)  Potassium 3.5 - 5.1 mmol/L 4.4 4.6 5.1  Chloride 98 - 111 mmol/L 106 104 112(H)  CO2 22 - 32 mmol/L 30 30 31   Calcium 8.9 - 10.3 mg/dL 2.9(V) 8.3(L) 8.1(L)  Total Protein 6.5 - 8.1 g/dL 4.7(L) 4.5(L) 4.5(L)  Total Bilirubin 0.3 - 1.2 mg/dL 4.3(H) 4.1(H) 3.1(H)  Alkaline Phos 38 - 126 U/L 140(H) 129(H) 164(H)  AST 15 - 41 U/L 71(H) 67(H) 74(H)  ALT 0 - 44 U/L 35 37 42    Imaging studies: No new pertinent imaging studies   Assessment/Plan:  72  y.o.malecritically ill from cirrhosis/respiratory statuswith stable anemia but persistently elevated leukocytosis (23.9K), no new CBC today, 11 Days Post-Ops/p exploratory laparotomy and repair of perforatedduodenal (just immediately post-pyloric)ulcer with graham patch. Patient critically ill and extremely weak.  He was on tube feedings and tolerating low rate.    Maximum temp yesterday was 100.0.    May consider doing a CT of the abdomen and pelvis if source of leukocytosis is not completely explained by her current lung status. Agree with current management, continue tube feedings and may advance slowly as tolerated with aspiration precautions.  We will keep trying to help control ascites for now.  We have to keep in consideration that this can be a source of focus of infection for the ascites.  Agree to continue management by critical care physician and recommendations.   Gae Gallop, MD

## 2019-05-12 NOTE — Progress Notes (Signed)
CRITICAL CARE NOTE  CC  follow up respiratory failure  SUBJECTIVE Patient remains critically ill Prognosis is guarded High risk for aspiration Increased somnolence Severe torticollis of neck CT neck and head NEG for acute process   BP (!) 112/54   Pulse 97   Temp 99.2 F (37.3 C) (Axillary)   Resp 20   Ht 6' (1.829 m)   Wt 76.2 kg   SpO2 92%   BMI 22.78 kg/m    I/O last 3 completed shifts: In: 2028 [NG/GT:2028] Out: 4245 [Urine:1575; Drains:2670] Total I/O In: 940 [NG/GT:940] Out: 1970 [Urine:650; Drains:1320]  SpO2: 92 % O2 Flow Rate (L/min): 3 L/min FiO2 (%): 35 %   SIGNIFICANT EVENTS MAJOR EVENTS/TEST RESULTS: 05/06CT abd/pelvis:Free intraperitoneal air. There are 2 areas of abnormal bowel, within the 1st and 2nd portions of the duodenum where there is wall thickening and indistinctness concerning for peptic ulcer disease. Appearance of the liver is suspicious for cirrhosis. Moderate to large volume ascites 05/06 echocardiogram:LVEF 30-35%. Severe hypokinesis of apical and periapical regions. 05/06 Admitted as above post op ex laparotomy with finding of perforated pyloric ulcer 05/07 Palliative Care consultation 05/08 Passed SBT and extubated. Tolerated well initially. Later in day, poor cough with excessive airway secretions. Required reintubation. Post intubation CXR consistent with aspiration (severe bilateral lower lobe airspace disease) 05/09 Full vent support with FiO2 65%. Requiring vasopressors.  05/10 gas exchange improving. Tolerates PS 10 cm H2O. Vasopressor requirement improving. 05/11 UGI Xray: No leak demonstrated. Tube feeds initiated. 05/12 tolerates pressure support ventilation. Chest x-ray continues to work demonstrate ARDS versus pulmonary edema. Furosemide ordered. Tube feeds advanced. Amiodarone initiated for recurrent AF 05/13 Tolerated PS 10 cm H2O. Further diuresis with furosemide. Tolerating TFs - advanced to goal rate of 55  cc/hr. Goals of care conversation with wife - optimize resp status over next 24-48 hrs, then, one way extubation 05/14 RASS -2. + F/C. NSR. Amiodarone transitioned to enteral - continue loading dose. Passed SBT. Plan one way extubation when wife arrives 5/15 very weak, high risk for aspiration, remains SD status 5/16 work up neck mass-CT head/neck, more critical today 5/17 CT head and NECK  REVIEW OF SYSTEMS  PATIENT IS UNABLE TO PROVIDE COMPLETE REVIEW OF SYSTEMS DUE TO SEVERE CRITICAL ILLNESS   PHYSICAL EXAMINATION:  GENERAL:critically ill appearing, HEAD: Normocephalic, atraumatic.  EYES: Pupils equal, round, reactive to light.  No scleral icterus.  MOUTH: Moist mucosal membrane. NECK: hard lump, increased muscle tone PULMONARY: +rhonchi,  CARDIOVASCULAR: S1 and S2. Regular rate and rhythm. No murmurs, rubs, or gallops.  GASTROINTESTINAL: Soft, nontender, -distended. No masses. Positive bowel sounds. No hepatosplenomegaly.  MUSCULOSKELETAL: No swelling, clubbing, or edema.  NEUROLOGIC: stuporous SKIN:intact,warm,dry  MEDICATIONS: I have reviewed all medications and confirmed regimen as documented   CULTURE RESULTS   Recent Results (from the past 240 hour(s))  MRSA PCR Screening     Status: None   Collection Time: 05/02/19 12:49 PM  Result Value Ref Range Status   MRSA by PCR NEGATIVE NEGATIVE Final    Comment:        The GeneXpert MRSA Assay (FDA approved for NASAL specimens only), is one component of a comprehensive MRSA colonization surveillance program. It is not intended to diagnose MRSA infection nor to guide or monitor treatment for MRSA infections. Performed at [facility], [address] [place], [place] [address]   Culture, respiratory (non-expectorated)     Status: None   Collection Time: 05/04/19  8:40 AM  Result Value Ref  Range Status   Specimen Description   Final    TRACHEAL ASPIRATE Performed at [facility] [address] [place], [place] [address]    Special Requests   Final    NONE Performed at [facility], [address] [place], [place] [address]    Gram Stain   Final    MODERATE WBC PRESENT, PREDOMINANTLY PMN NO SQUAMOUS EPITHELIAL CELLS SEEN NO ORGANISMS SEEN    Culture   Final    NO GROWTH 2 DAYS Performed at [facility], [address] [place], [place] [address]    Report Status 05/07/2019 FINAL  Final          IMAGING    Ct Head Wo Contrast  Result Date: 05/11/2019 CLINICAL DATA:  Altered mental status EXAM: CT HEAD WITHOUT CONTRAST TECHNIQUE: Contiguous axial images were obtained from the base of the skull through the vertex without intravenous contrast. COMPARISON:  MRI head 05/29/2018 FINDINGS: Brain: Moderate atrophy. Negative for hydrocephalus. Negative for acute infarct, hemorrhage, or mass. Vascular: Negative for hyperdense vessel. Skull: Negative Sinuses/Orbits: Mucosal edema right maxillary sinus and left ethmoid sinus. Mild left mastoid effusion. Negative orbit Other: None IMPRESSION: Moderate atrophy.  No acute abnormality. Electronically Signed   By: Marlan Palau M.D.   On: 05/11/2019 18:38   Ct Soft Tissue Neck Wo Contrast  Result Date: 05/11/2019 CLINICAL DATA:  Abnormal neck position.  Recently extubated. EXAM: CT NECK WITHOUT CONTRAST TECHNIQUE: Multidetector CT imaging of the neck was performed following the standard protocol without intravenous contrast. COMPARISON:  None. FINDINGS: Pharynx and larynx: Normal. No mass or swelling. NG tube in the pharynx extending into the esophagus. Salivary glands: No inflammation, mass, or stone. Thyroid: Negative Lymph nodes: No enlarged lymph nodes in the neck Vascular: Carotid artery calcification bilaterally. Limited vascular evaluation without intravenous contrast. Left jugular vein central venous catheter extending into the SVC. Atherosclerotic aortic arch. Limited intracranial: CT head from today reported  separately Visualized orbits: Negative Mastoids and visualized paranasal sinuses: Mucosal edema right maxillary sinus. Skeleton: The head is tilted and turned to the left compatible with torticollis. Cervical spondylosis. No acute skeletal abnormality. Upper chest: Bilateral upper lobe infiltrates as noted on chest x-ray Other: None IMPRESSION: 1. 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 No acute skeletal abnormality. Cervical spondylosis. 2. Atherosclerotic disease 3. Negative for mass in the neck. Electronically Signed   By: Marlan Palau M.D.   On: 05/11/2019 18:35    CBC    Component Value Date/Time   WBC 23.9 (H) 05/11/2019 0420   RBC 2.78 (L) 05/11/2019 0420   HGB 9.9 (L) 05/11/2019 0420   HGB 11.8 (L) 08/29/2012 0428   HCT 28.9 (L) 05/11/2019 0420   HCT 34.6 (L) 08/29/2012 0428   PLT 100 (L) 05/11/2019 0420   PLT 200 08/29/2012 0428   MCV 104.0 (H) 05/11/2019 0420   MCV 94 08/29/2012 0428   MCH 35.6 (H) 05/11/2019 0420   MCHC 34.3 05/11/2019 0420   RDW 17.9 (H) 05/11/2019 0420   RDW 13.7 08/29/2012 0428   LYMPHSABS 1.3 05/11/2019 0420   LYMPHSABS 2.1 08/29/2012 0428   MONOABS 1.8 (H) 05/11/2019 0420   MONOABS 0.9 08/29/2012 0428   EOSABS 0.1 05/11/2019 0420   EOSABS 0.7 08/29/2012 0428   BASOSABS 0.0 05/11/2019 0420   BASOSABS 0.1 08/29/2012 0428   BMP Latest Ref Rng & Units 05/11/2019 05/10/2019 05/09/2019  Glucose 70 - 99 mg/dL 962(X) 528(U) 132(G)  BUN 8 - 23 mg/dL 16(X57(H) 09(U52(H) 04(V43(H)  Creatinine 0.61 - 1.24 mg/dL 4.091.23 8.111.09 9.141.00  Sodium 135 - 145 mmol/L 144 141 148(H)  Potassium 3.5 - 5.1 mmol/L 4.4 4.6 5.1  Chloride 98 - 111 mmol/L 106 104 112(H)  CO2 22 - 32 mmol/L 30 30 31   Calcium 8.9 - 10.3 mg/dL 7.8(G8.6(L) 8.3(L) 8.1(L)      Indwelling Urinary Catheter continued, requirement due to   Reason to continue Indwelling Urinary Catheter strict Intake/Output monitoring for hemodynamic instability   Central Line/ continued, requirement due to  Reason to  continue [employer] Line Monitoring of central venous pressure or other hemodynamic parameters and poor IV access   Ventilator continued, requirement due to severe respiratory failure   Ventilator Sedation RASS 0 to -2      ASSESSMENT AND PLAN SYNOPSIS S/P emergent laparotomy for perforated pyloric ulcer with postoperative shock complicated by severe sCHF and severe liver cirrhosis  Severe ACUTE Hypoxic and Hypercapnic Respiratory Failure Resolved Oxygen as needed High risk for aspiration  ACUTE SYSTOLIC CARDIAC FAILURE- EF 30% -oxygen as needed -Lasix as tolerated -follow up cardiac enzymes as indicated -follow up cardiology recs    ACUTE KIDNEY INJURY/Renal Failure -follow chem 7 -follow UO -continue Foley Catheter-assess need    NEUROLOGY Avoid sedatives Will give valium as muscle relaxant   SHOCK-HYPOVOLUMIC/CARDIOGENIC -use vasopressors to keep MAP>65 if needed   CARDIAC ICU monitoring  ID -continue IV abx as prescibed -follow up cultures  GI GI PROPHYLAXIS as indicated  NUTRITIONAL STATUS-severe malnutrition DIET-->TF's as tolerated Constipation protocol as indicated  ENDO - will use ICU hypoglycemic\Hyperglycemia protocol if indicated   ELECTROLYTES -follow labs as needed -replace as needed -pharmacy consultation and following   DVT/GI PRX ordered TRANSFUSIONS AS NEEDED MONITOR FSBS ASSESS the need for LABS as needed   Critical Care Time devoted to patient care services described in this note is 34 minutes.   Overall, patient is critically ill, prognosis is guarded.  Patient with Multiorgan failure and at high risk for cardiac arrest and death.    Patient is DNI  Lucie LeatherKurian David Kealey Kemmer, M.D.  [facility]  Medical Director [facility] Medical Director [facility] Card[facility]

## 2019-05-12 NOTE — Progress Notes (Signed)
GI note  Patient critically ill with possible sepsis, S/p surgery , GI consulted for ascites. Not yet a candidate for TIPS due to concerns for ongoing Sepsis. Fluids and diuretics managed in ICU.   I will sign off.  Please call me if any further GI concerns or questions.  We would like to thank you for the opportunity to participate in the care of Darren [name].

## 2019-05-12 NOTE — Progress Notes (Signed)
PT Cancellation Note  Patient Details Name: Darren [name] MRN: [id number] DOB: 26-Sep-1946   Cancelled Treatment:    Reason Eval/Treat Not Completed: Patient not medically ready(Hold per nursing at this time. Patient not medically ready/stable. Will follow patient closely  and attempt again at later time/date once appropriate. )  Precious Bard, PT, DPT   05/12/2019, 11:58 AM

## 2019-05-13 LAB — CBC
HCT: 30.9 % — ABNORMAL LOW (ref 39.0–52.0)
Hemoglobin: 10.2 g/dL — ABNORMAL LOW (ref 13.0–17.0)
MCH: 34.9 pg — ABNORMAL HIGH (ref 26.0–34.0)
MCHC: 33 g/dL (ref 30.0–36.0)
MCV: 105.8 fL — ABNORMAL HIGH (ref 80.0–100.0)
Platelets: 171 10*3/uL (ref 150–400)
RBC: 2.92 MIL/uL — ABNORMAL LOW (ref 4.22–5.81)
RDW: 17.8 % — ABNORMAL HIGH (ref 11.5–15.5)
WBC: 19 10*3/uL — ABNORMAL HIGH (ref 4.0–10.5)
nRBC: 0.1 % (ref 0.0–0.2)

## 2019-05-13 LAB — GLUCOSE, CAPILLARY
Glucose-Capillary: 121 mg/dL — ABNORMAL HIGH (ref 70–99)
Glucose-Capillary: 130 mg/dL — ABNORMAL HIGH (ref 70–99)
Glucose-Capillary: 133 mg/dL — ABNORMAL HIGH (ref 70–99)
Glucose-Capillary: 152 mg/dL — ABNORMAL HIGH (ref 70–99)

## 2019-05-13 LAB — BASIC METABOLIC PANEL
Anion gap: 9 (ref 5–15)
BUN: 60 mg/dL — ABNORMAL HIGH (ref 8–23)
CO2: 29 mmol/L (ref 22–32)
Calcium: 8.8 mg/dL — ABNORMAL LOW (ref 8.9–10.3)
Chloride: 111 mmol/L (ref 98–111)
Creatinine, Ser: 1.2 mg/dL (ref 0.61–1.24)
GFR calc Af Amer: >60 mL/min (ref >60)
GFR calc non Af Amer: >60 mL/min (ref >60)
Glucose, Bld: 124 mg/dL — ABNORMAL HIGH (ref 70–99)
Potassium: 3.3 mmol/L — ABNORMAL LOW (ref 3.5–5.1)
Sodium: 149 mmol/L — ABNORMAL HIGH (ref 135–145)

## 2019-05-13 LAB — PHOSPHORUS: Phosphorus: 4.2 mg/dL (ref 2.5–4.6)

## 2019-05-13 LAB — MAGNESIUM: Magnesium: 2.6 mg/dL — ABNORMAL HIGH (ref 1.7–2.4)

## 2019-05-13 MED ORDER — POTASSIUM CHLORIDE 20 MEQ PO PACK
40.0000 meq | PACK | ORAL | Status: AC
  Administered 2019-05-13 (×2): 40 meq
  Filled 2019-05-13 (×2): qty 2

## 2019-05-13 MED ORDER — FREE WATER
200.0000 mL | Status: DC
  Administered 2019-05-13 – 2019-05-15 (×14): 200 mL

## 2019-05-13 MED ORDER — ENOXAPARIN SODIUM 40 MG/0.4ML ~~LOC~~ SOLN
40.0000 mg | Freq: Every day | SUBCUTANEOUS | Status: DC
  Administered 2019-05-13 – 2019-05-15 (×3): 40 mg via SUBCUTANEOUS
  Filled 2019-05-13 (×3): qty 0.4

## 2019-05-13 MED ORDER — VENLAFAXINE HCL 37.5 MG PO TABS
18.7500 mg | ORAL_TABLET | Freq: Two times a day (BID) | ORAL | Status: DC
  Administered 2019-05-13 – 2019-05-16 (×6): 18.75 mg
  Filled 2019-05-13 (×9): qty 0.5

## 2019-05-13 MED ORDER — INSULIN ASPART 100 UNIT/ML ~~LOC~~ SOLN
0.0000 [IU] | SUBCUTANEOUS | Status: DC
  Administered 2019-05-13 – 2019-05-14 (×4): 1 [IU] via SUBCUTANEOUS
  Administered 2019-05-14 (×3): 2 [IU] via SUBCUTANEOUS
  Administered 2019-05-14: 1 [IU] via SUBCUTANEOUS
  Administered 2019-05-15 (×2): 2 [IU] via SUBCUTANEOUS
  Administered 2019-05-15 (×2): 1 [IU] via SUBCUTANEOUS
  Administered 2019-05-15 (×2): 2 [IU] via SUBCUTANEOUS
  Administered 2019-05-16: 1 [IU] via SUBCUTANEOUS
  Filled 2019-05-13 (×14): qty 1

## 2019-05-13 NOTE — Progress Notes (Signed)
Pharmacy Electrolyte Monitoring Consult:  Pharmacy consulted to assist in monitoring and replacing electrolytes in this 73 y.o. male admitted on May 24, 2019. Patient is currently day 12, s/p exploratory laparotomy and repair of perforated duodenal ulcer. Patient with significant history of alcohol abuse causing cirrhosis and ascites. Patient remains in the ICU and receiving PIVOT 1.5 CAL at 43mL/hr.   Labs:  Sodium (mmol/[name])  Date Value  05/13/2019 149 (H)  08/28/2012 139   Potassium (mmol/[name])  Date Value  05/13/2019 3.3 ([name])  08/28/2012 3.6   Magnesium (mg/dL)  Date Value  75/30/0511 2.6 (H)   Phosphorus (mg/dL)  Date Value  02/05/1734 4.2   Calcium (mg/dL)  Date Value  67/12/4101 8.8 ([name])   Calcium, Total (mg/dL)  Date Value  01/25/4387 8.9   Albumin (g/dL)  Date Value  87/57/9728 2.6 ([name])  07/12/2012 3.8   Corrected calcium:  8.2  Assessment/Plan: Sodium is trending up. Free water flushes increased to Q4hr.   Potassium continues to trend down. Potassium VT Q4hr x 2 doses.   BMP with am labs.   Will replace for goal potassium ~ 4 and goal magnesium ~ 2.   Pharmacy will continue to monitor and adjust per consult.   Darren [name],Darren [name] 05/13/2019 12:11 PM

## 2019-05-13 NOTE — Progress Notes (Signed)
[facility] SURGICAL PROGRESS NOTE  Hospital Day(s): 13.   Post op day(s): 12 Days Post-Op.   Interval History: Patient seen and examined, Tube feedings held yesterday for rhonchorous breath sounds + hypoxia which responded to NRB. Leukocytosis had imprved to 19K over the weekend, pending CBC this morning. No fevers. No complaints of abdominal pain per patient. Difficult to obtain history as patient very weak with limited participation. JP with 1.2L out    Review of Systems:  Constitutional: denies fever Gastrointestinal: denies abdominal pain, N/V Integumentary: + Midline laparotomy incision   Vital signs in last 24 hours: [min-max] current  Temp:  [97.7 F (36.5 C)-98.6 F (37 C)] 97.7 F (36.5 C) (05/18 0400) Pulse Rate:  [87-104] 95 (05/18 0600) Resp:  [19-32] 21 (05/18 0600) BP: (88-125)/(47-77) 108/57 (05/18 0600) SpO2:  [87 %-100 %] 95 % (05/18 0721)     Height: 6' (182.9 cm) Weight: 76.2 kg BMI (Calculated): 22.78   Intake/Output this shift:  No intake/output data recorded.    Physical Exam:  Constitutional: alert, appears very weak Respiratory: breathing non-labored at rest, on [place]  Gastrointestinal: soft, non-tender, and non-distended. JP in RUQ with high output, serosanguinous, consistent with ascites Genitourinary: Foley catheter in place Integumentary:Midline laparotomy incision is CDI with staples, no erythema or drainage   Labs:  CBC Latest Ref Rng & Units 05/12/2019 05/11/2019 05/10/2019  WBC 4.0 - 10.5 K/uL 19.4(H) 23.9(H) 23.1(H)  Hemoglobin 13.0 - 17.0 g/dL 6.2(M) 3.5(D) 9.7(C)  Hematocrit 39.0 - 52.0 % 29.6(L) 28.9(L) 28.9(L)  Platelets 150 - 400 K/uL 129(L) 100(L) 73(L)   CMP Latest Ref Rng & Units 05/12/2019 05/11/2019 05/10/2019  Glucose 70 - 99 mg/dL 163(A) 453(M) 468(E)  BUN 8 - 23 mg/dL 32(Z) 22(Q) 82(N)  Creatinine 0.61 - 1.24 mg/dL 0.03(B) 0.48 8.89  Sodium 135 - 145 mmol/L 145 144 141  Potassium 3.5 - 5.1 mmol/L 3.5 4.4 4.6   Chloride 98 - 111 mmol/L 107 106 104  CO2 22 - 32 mmol/L 29 30 30   Calcium 8.9 - 10.3 mg/dL 1.6(X) 4.5(W) 8.3(L)  Total Protein 6.5 - 8.1 g/dL 4.8(L) 4.7(L) 4.5(L)  Total Bilirubin 0.3 - 1.2 mg/dL 3.6(H) 4.3(H) 4.1(H)  Alkaline Phos 38 - 126 U/L 139(H) 140(H) 129(H)  AST 15 - 41 U/L 62(H) 71(H) 67(H)  ALT 0 - 44 U/L 34 35 37     Imaging studies: No new pertinent imaging studies   Assessment/Plan: (ICD-10's: K25.5) 73 y.o. male with improving leukocytosis over the weekend (pending CBC this morning) who remains critically ill 12 Days Post-Op s/p exploratory laparotomy and repair of perforatedduodenal (just immediately post-pyloric)ulcer with graham patch   - Tube feedings held due to respiratory concerns; resume when felt appropriate   - Repeat CBC pending; if persistently elevated without obvious source may need to consider CT A/P to further assess   - Monitor hgb; electrolytes - monitor bowel function - Monitor JP output; replete with albumin (6-8 gtram albumin per 1L output); remains serosanguinous. Output will remain high due to cirrhosis and ascites production, however, if JP is removed, ascites will simply leak from the hole and could potentially result in bacterial peritonitis from tracking.              - Further management per PCCM team; appreciate their help   All of the above findings and recommendations were discussed with the patient, and the medical team, and all of patient's questions were answered to his expressed satisfaction.  -- Lynden Oxford, PA-C  [facility] 05/13/2019, 8:15 AM [phone] M-F: 7am - 4pm

## 2019-05-13 NOTE — TOC Progression Note (Signed)
Transition of Care [facility]) - Progression Note    Patient Details  Name: Darren [name] MRN: [id number] Date of Birth: [birth year]  Transition of Care [facility]) CM/SW Contact  Allayne Butcher, RN Phone Number: 05/13/2019, 12:11 PM  Clinical Narrative:    RNCM spoke with patient's wife Darren [name] about LTAC.  Darren [name] is agreeable to seeing if patient qualifies for LTAC.  Wife understands that patient will need extensive recovery and rehab once stable enough to leave the ICU.  Patient is currently on nasal cannula very lethargic and weak, patient is unable to speak at this time.  PT attempted to work with patient with little success.  Erika with [facility] will screen to see if patient meets criteria.  Sarah with [facility] will also screen patient.     Expected Discharge Plan: Long Term Acute Care (LTAC) Barriers to Discharge: Continued Medical Work up  Expected Discharge Plan and Services Expected Discharge Plan: Long Term Acute Care (LTAC)       Living arrangements for the past 2 months: Single Family Home                                       Social Determinants of Health (SDOH) Interventions    Readmission Risk Interventions No flowsheet data found.

## 2019-05-13 NOTE — Progress Notes (Signed)
PT Cancellation Note  Patient Details Name: Darren [name] MRN: [id number] DOB: 1946-02-08   Cancelled Treatment:    Reason Eval/Treat Not Completed: Other (comment).  PT consult received.  Chart reviewed.  Nurse cleared pt for participation in physical therapy.  Pt with eyes closed upon PT entering room.  Able to briefly open eyes with vc's or tactile cues but pt appeared to quickly fall back asleep each time.  On 3rd attempt able to get pt to gently squeeze therapists hand with R UE but pt did not respond to any other cueing.  No attempts at vocalization noted.  Attempted to gently turn pt's head to R (positioned rotated significantly to L) but with very minimal movement pt appearing to be in significant pain and resisting so deferred further attempts (pt appearing comfortable once resting back in L cervical rotation).  Will re-attempt PT evaluation at a later date/time.  Hendricks Limes, PT 05/13/19, 12:21 PM [phone]

## 2019-05-14 ENCOUNTER — Encounter: Payer: Self-pay | Admitting: Internal Medicine

## 2019-05-14 DIAGNOSIS — L8990 Pressure ulcer of unspecified site, unspecified stage: Secondary | ICD-10-CM

## 2019-05-14 LAB — BASIC METABOLIC PANEL
Anion gap: 6 (ref 5–15)
BUN: 68 mg/dL — ABNORMAL HIGH (ref 8–23)
CO2: 30 mmol/L (ref 22–32)
Calcium: 8.6 mg/dL — ABNORMAL LOW (ref 8.9–10.3)
Chloride: 115 mmol/L — ABNORMAL HIGH (ref 98–111)
Creatinine, Ser: 1.37 mg/dL — ABNORMAL HIGH (ref 0.61–1.24)
GFR calc Af Amer: >60 mL/min (ref >60)
GFR calc non Af Amer: 52 mL/min — ABNORMAL LOW (ref >60)
Glucose, Bld: 150 mg/dL — ABNORMAL HIGH (ref 70–99)
Potassium: 4.1 mmol/L (ref 3.5–5.1)
Sodium: 151 mmol/L — ABNORMAL HIGH (ref 135–145)

## 2019-05-14 LAB — CBC WITH DIFFERENTIAL/PLATELET
Abs Immature Granulocytes: 0.14 10*3/uL — ABNORMAL HIGH (ref 0.00–0.07)
Basophils Absolute: 0 10*3/uL (ref 0.0–0.1)
Basophils Relative: 0 %
Eosinophils Absolute: 0.2 10*3/uL (ref 0.0–0.5)
Eosinophils Relative: 1 %
HCT: 28.3 % — ABNORMAL LOW (ref 39.0–52.0)
Hemoglobin: 9.9 g/dL — ABNORMAL LOW (ref 13.0–17.0)
Immature Granulocytes: 1 %
Lymphocytes Relative: 8 %
Lymphs Abs: 1.6 10*3/uL (ref 0.7–4.0)
MCH: 37.4 pg — ABNORMAL HIGH (ref 26.0–34.0)
MCHC: 35 g/dL (ref 30.0–36.0)
MCV: 106.8 fL — ABNORMAL HIGH (ref 80.0–100.0)
Monocytes Absolute: 1.8 10*3/uL — ABNORMAL HIGH (ref 0.1–1.0)
Monocytes Relative: 9 %
Neutro Abs: 16.4 10*3/uL — ABNORMAL HIGH (ref 1.7–7.7)
Neutrophils Relative %: 81 %
Platelets: 183 10*3/uL (ref 150–400)
RBC: 2.65 MIL/uL — ABNORMAL LOW (ref 4.22–5.81)
RDW: 18 % — ABNORMAL HIGH (ref 11.5–15.5)
WBC: 20.1 10*3/uL — ABNORMAL HIGH (ref 4.0–10.5)
nRBC: 0 % (ref 0.0–0.2)

## 2019-05-14 LAB — GLUCOSE, CAPILLARY
Glucose-Capillary: 133 mg/dL — ABNORMAL HIGH (ref 70–99)
Glucose-Capillary: 136 mg/dL — ABNORMAL HIGH (ref 70–99)
Glucose-Capillary: 149 mg/dL — ABNORMAL HIGH (ref 70–99)
Glucose-Capillary: 150 mg/dL — ABNORMAL HIGH (ref 70–99)
Glucose-Capillary: 158 mg/dL — ABNORMAL HIGH (ref 70–99)
Glucose-Capillary: 196 mg/dL — ABNORMAL HIGH (ref 70–99)

## 2019-05-14 LAB — AMMONIA: Ammonia: 36 umol/L — ABNORMAL HIGH (ref 9–35)

## 2019-05-14 MED ORDER — AMIODARONE HCL 200 MG PO TABS
200.0000 mg | ORAL_TABLET | Freq: Every day | ORAL | Status: DC
  Administered 2019-05-15: 11:00:00 200 mg
  Filled 2019-05-14: qty 1

## 2019-05-14 MED ORDER — CHLORHEXIDINE GLUCONATE CLOTH 2 % EX PADS
6.0000 | MEDICATED_PAD | Freq: Every day | CUTANEOUS | Status: DC
  Administered 2019-05-14 – 2019-05-15 (×2): 6 via TOPICAL

## 2019-05-14 MED ORDER — DEXTROSE 5 % IV SOLN
INTRAVENOUS | Status: DC
  Administered 2019-05-14 – 2019-05-15 (×2): via INTRAVENOUS

## 2019-05-14 MED ORDER — PIVOT 1.5 CAL PO LIQD
1000.0000 mL | ORAL | Status: DC
  Administered 2019-05-14 – 2019-05-15 (×2): 1000 mL
  Filled 2019-05-14: qty 1000

## 2019-05-14 MED ORDER — AMIODARONE HCL 200 MG PO TABS: 200.0000 mg | ORAL_TABLET | Freq: Two times a day (BID) | ORAL | Status: DC

## 2019-05-14 MED ORDER — SENNOSIDES-DOCUSATE SODIUM 8.6-50 MG PO TABS: 1.0000 | ORAL_TABLET | Freq: Two times a day (BID) | ORAL | Status: DC

## 2019-05-14 MED ORDER — SENNOSIDES-DOCUSATE SODIUM 8.6-50 MG PO TABS
1.0000 | ORAL_TABLET | Freq: Two times a day (BID) | ORAL | Status: DC
  Filled 2019-05-14: qty 1

## 2019-05-14 NOTE — Progress Notes (Signed)
Daily Progress Note   Patient Name: Darren [name]       Date: 05/14/2019 DOB: [birth year]  Age: 73 y.o. MRN#: [id number] Attending Physician: Duanne Guessannon, Jennifer, MD Primary Care Physician: Marisue IvanLinthavong, Kanhka, MD Admit Date: 05/23/2019  Reason for Consultation/Follow-up: Psychosocial/spiritual support  Subjective: Patient is resting in bed extubated. Drain remains in place, sanguinous drainage, drain output decreasing. He opens eyes to voice but does not speak. No distress noted.  Called wife to offer support and discuss status, unable to reach her.   Length of Stay: 14  Current Medications: Scheduled Meds:  . [START ON 05/15/2019] amiodarone  200 mg Per Tube Daily  . Chlorhexidine Gluconate Cloth  6 each Topical Daily  . enoxaparin (LOVENOX) injection  40 mg Subcutaneous Daily  . folic acid  1 mg Per Tube Daily  . free water  200 mL Per Tube Q4H  . insulin aspart  0-9 Units Subcutaneous Q4H  . ipratropium-albuterol  3 mL Nebulization Q6H  . multivitamin  15 mL Per Tube Daily  . pantoprazole sodium  40 mg Per Tube Q1200  . senna-docusate  1 tablet Per Tube BID  . thiamine  100 mg Per Tube Daily  . venlafaxine  18.75 mg Per Tube BID    Continuous Infusions: . sodium chloride 250 mL (05/10/19 0301)  . dextrose    . feeding supplement (PIVOT 1.5 CAL)      PRN Meds: sodium chloride, acetaminophen **OR** [DISCONTINUED] acetaminophen, bisacodyl, fentaNYL (SUBLIMAZE) injection, sennosides  Physical Exam Pulmonary:     Effort: Pulmonary effort is normal.  Neurological:     Comments: Opens eyes to speech. Does not speak to this Clinical research associatewriter.               Vital Signs: BP (!) 99/54   Pulse 81   Temp 98 F (36.7 C) (Axillary)   Resp 20   Ht 6' (1.829 m)   Wt 76.2 kg   SpO2 95%    BMI 22.78 kg/m  SpO2: SpO2: 95 % O2 Device: O2 Device: Nasal Cannula O2 Flow Rate: O2 Flow Rate (L/min): 6 L/min  Intake/output summary:   Intake/Output Summary (Last 24 hours) at 05/14/2019 1251 Last data filed at 05/14/2019 0600 Gross per 24 hour  Intake 1816.33 ml  Output 1925 ml  Net -108.67 ml  LBM: Last BM Date: 05/05/19 Baseline Weight: Weight: 79.8 kg Most recent weight: Weight: 76.2 kg       Palliative Assessment/Data:    Flowsheet Rows     Most Recent Value  Intake Tab  Referral Department  Critical care  Unit at Time of Referral  ICU  Date Notified  05/02/19  Palliative Care Type  New Palliative care  Reason for referral  Clarify Goals of Care  Date of Admission  2019-05-05  Date first seen by Palliative Care  05/02/19  # of days Palliative referral response time  0 Day(s)  # of days IP prior to Palliative referral  2  Clinical Assessment  Psychosocial & Spiritual Assessment  Palliative Care Outcomes      Patient Active Problem List   Diagnosis Date Noted  . Pressure injury of skin 05/14/2019  . Alcoholic cirrhosis of liver with ascites (HCC)   . Perforated viscus 05/05/19    Palliative Care Assessment & Plan     Recommendations/Plan:  Remains extubated.   Recommend palliative to follow at D/C.   Code Status:    Code Status Orders  (From admission, onward)         Start     Ordered   2019-05-05 2344  Full code  Continuous     May 05, 2019 2350        Code Status History    This patient has a current code status but no historical code status.       Prognosis:   Unable to determine  Discharge Planning:  To Be Determined  Care plan was discussed with RN  Thank you for allowing the Palliative Medicine Team to assist in the care of this patient.   Total Time 15 min Prolonged Time Billed  no      Greater than 50%  of this time was spent counseling and coordinating care related to the above assessment and plan.  Morton Stall, NP  Please contact Palliative Medicine Team phone at [phone] for questions and concerns.

## 2019-05-14 NOTE — Progress Notes (Signed)
Assisted tele visit to patient with wife.  Ranvir Renovato Ferrer, RN  

## 2019-05-14 NOTE — Progress Notes (Signed)
Pharmacy Electrolyte Monitoring Consult:  Pharmacy consulted to assist in monitoring and replacing electrolytes in this 73 y.o. male admitted on 05/09/2019. Patient is currently day 12, s/p exploratory laparotomy and repair of perforated duodenal ulcer. Patient with significant history of alcohol abuse causing cirrhosis and ascites. Patient remains in the ICU and receiving PIVOT 1.5 CAL at 69mL/hr.   Labs:  Sodium (mmol/L)  Date Value  05/14/2019 151 (H)  08/28/2012 139   Potassium (mmol/L)  Date Value  05/14/2019 4.1  08/28/2012 3.6   Magnesium (mg/dL)  Date Value  30/04/1101 2.6 (H)   Phosphorus (mg/dL)  Date Value  11/11/3566 4.2   Calcium (mg/dL)  Date Value  01/41/0301 8.6 (L)   Calcium, Total (mg/dL)  Date Value  31/43/8887 8.9   Albumin (g/dL)  Date Value  57/97/2820 2.6 (L)  07/12/2012 3.8    Assessment/Plan: Sodium is trending up. Free water flushes increased to Q4hr on 5/18. Per AM ICU rounds, will initiate dextrose 5% at 65mL/hr.    BMP with am labs.   Will replace for goal potassium ~ 4 and goal magnesium ~ 2.   Pharmacy will continue to monitor and adjust per consult.   Oskar Cretella L 05/14/2019 4:49 PM

## 2019-05-14 NOTE — Progress Notes (Addendum)
[facility] SURGICAL PROGRESS NOTE  Hospital Day(s): 14.   Post op day(s): 13 Days Post-Op.   Interval History: Patient seen and examined, no acute events or new complaints overnight. History is difficult to obtain secondary to weakness/patient insight and participation. No recorded fevers. Still with persistent leukocytosis (20K). Started back on tube feedings/free water. JP output slightly less than 1L per chart in last 24 hours.   Review of Systems:  Unable to obtain secondary to patient participation/weakness   Vital signs in last 24 hours: [min-max] current  Temp:  [97.4 F (36.3 C)-98 F (36.7 C)] 98 F (36.7 C) (05/19 0400) Pulse Rate:  [72-103] 81 (05/19 0700) Resp:  [16-28] 20 (05/19 0700) BP: (86-123)/(51-76) 99/54 (05/19 0700) SpO2:  [89 %-99 %] 95 % (05/19 0723)     Height: 6' (182.9 cm) Weight: 76.2 kg BMI (Calculated): 22.78   Intake/Output this shift:  No intake/output data recorded.    Physical Exam:  Constitutional: alert,appears very weak HEENT: Torticollis (head tilted to the left),NGT in place Respiratory: breathing non-labored at rest, on [place] Gastrointestinal: soft, non-tender, and non-distended. JP in RUQwith high output, serosanguinous, consistent with ascites Genitourinary: Foley catheter in place Integumentary:Midline laparotomy incision is CDI with staples, no erythema or drainage   Labs:  CBC Latest Ref Rng & Units 05/14/2019 05/13/2019 05/12/2019  WBC 4.0 - 10.5 K/uL 20.1(H) 19.0(H) 19.4(H)  Hemoglobin 13.0 - 17.0 g/dL 1.2(R) 10.2(L) 9.8(L)  Hematocrit 39.0 - 52.0 % 28.3(L) 30.9(L) 29.6(L)  Platelets 150 - 400 K/uL 183 171 129(L)   CMP Latest Ref Rng & Units 05/14/2019 05/13/2019 05/12/2019  Glucose 70 - 99 mg/dL 975(O) 832(P) 498(Y)  BUN 8 - 23 mg/dL 64(B) 58(X) 09(M)  Creatinine 0.61 - 1.24 mg/dL 0.76(K) 0.88 1.10(R)  Sodium 135 - 145 mmol/L 151(H) 149(H) 145  Potassium 3.5 - 5.1 mmol/L 4.1 3.3(L) 3.5  Chloride 98 - 111  mmol/L 115(H) 111 107  CO2 22 - 32 mmol/L 30 29 29   Calcium 8.9 - 10.3 mg/dL 1.5(X) 4.5(O) 5.9(Y)  Total Protein 6.5 - 8.1 g/dL - - 4.8(L)  Total Bilirubin 0.3 - 1.2 mg/dL - - 3.6(H)  Alkaline Phos 38 - 126 U/L - - 139(H)  AST 15 - 41 U/L - - 62(H)  ALT 0 - 44 U/L - - 34     Imaging studies: No new pertinent imaging studies   Assessment/Plan: (ICD-10's: K25.5) 73 y.o. male with persistent leukocytosis still critically ill with cirrhosis and somewhat improving respiratory status and profound weakness 13 Days Post-Op s/p exploratory laparotomy and repair of perforatedduodenal (just immediately post-pyloric)ulcer with graham patch   - Okay for tube feedings/free water through NGT; should consider post-pyloric feeding tube at this point if concerns for aspiration significant enough   - Monitor leukocytosis; hgb; electrolytes   - monitor bowel function  - Continue staples 1 more day (total 14 days at that point); consider removing every other one tomorrow - Monitor JP output; replete with albumin (6-8 gtram albumin per 1L output); remains serosanguinous. Output will remain high due to cirrhosis and ascites production, however, if JP is removed, ascites will simply leak from the hole and could potentially result in bacterial peritonitis from tracking.   - Further management per PCCM team; appreciate their help   - When deemed appropriate for transfer to med-surg floor will likely transfer to medicine service given the primary issues are non-surgical at this point   All of the above findings and recommendations were discussed with the  the medical team  -- Lynden OxfordZachary Schulz, PA-C [facility] 05/14/2019, 7:58 AM [phone] M-F: 7am - 4pm  I saw and evaluated the patient.  I agree with the above documentation, exam, and plan, which I have edited where appropriate. Duanne GuessJennifer Nakshatra Klose  9:28 AM

## 2019-05-14 NOTE — Evaluation (Signed)
Physical Therapy Evaluation Patient Details Name: Darren [name] MRN: [id number] DOB: 10-12-1946 Today's Date: 05/14/2019   History of Present Illness  Pt is a 73 y.o. male presenting to hospital 05/06/2019 with severe acute abdominal pain.  Pt s/p exploratory lap and primary repair of perforated ulcer with omental Graham patch 05/21/2019 secondary perforated ulcer near the pylorus.  Pt remained intubated post procedure and transferred to ICU post op secondary hypotensive and hyponatremic.  Pt extubated 5/8 and reintubated same day; extubated 05/09/19.  Severe torticollis of neck noted in chart.  PMH includes htn, MI, h/o alcohol use.  Of note, per chart pt with h/o POW for 16 months.  Clinical Impression  Pt with eyes closed upon PT entering room.  Opens eyes briefly intermittently during session with vc's.  Pt noted to have h/o POW so therapist focused on a lot of verbal explanation and went slowly during session (even with this, pt initially intermittently demonstrating jerking movement to touch but improved during session's activities).  Pt's head demonstrating significant left lateral and left rotation positioning (pt appearing with significant discomfort with any gentle attempt at PROM towards neutral cervical positioning).  Inconsistent with following 1 step commands (did better with extra time and gentle cueing).  With 2 assist and use of bed sheet able to sit pt on edge of bed; initially pt appearing to be in pain and grabbed therapists hand with his L hand but pt appeared more comfortable after about 30 seconds; pt with posterior lean (mod to max assist for sitting balance) and fatigued quickly sitting edge of bed after a few minutes (O2 sats also then not getting a good reading) so pt assisted back to bed.  O2 sats 91% on 6 L and HR 86 bpm laying in bed end of session.  Session discussed with pt's nurse.  Pt would benefit from skilled PT to address noted impairments and functional limitations (see  below for any additional details).  Upon hospital discharge, recommend pt discharge to [facility].    Follow Up Recommendations LTACH    Equipment Recommendations  Other (comment)(TBD at next facility)    Recommendations for Other Services OT consult     Precautions / Restrictions Precautions Precautions: Fall Precaution Comments: H/o POW for 16 months (per chart pt needs to be worked with gently with plenty of explanation); gastric tube; HOB >30 degrees; keep O2 > or = 90%; aspiration.; abdominal drain Restrictions Weight Bearing Restrictions: No      Mobility  Bed Mobility Overal bed mobility: Needs Assistance Bed Mobility: Supine to Sit;Sit to Supine     Supine to sit: Total assist;+2 for physical assistance Sit to supine: Total assist;+2 for physical assistance   General bed mobility comments: assist for trunk and B LE's semi-supine to/from sit  Transfers                 General transfer comment: Deferred d/t fatigue and sitting balance assist needs  Ambulation/Gait             General Gait Details: Deferred d/t fatigue and sitting balance assist needs  Stairs            Wheelchair Mobility    Modified Rankin (Stroke Patients Only)       Balance Overall balance assessment: Needs assistance Sitting-balance support: Bilateral upper extremity supported;Feet unsupported Sitting balance-Leahy Scale: Poor Sitting balance - Comments: posterior lean in sitting requiring mod to max assist to maintain sitting balance Postural control: Posterior lean  Pertinent Vitals/Pain Pain Assessment: Faces Faces Pain Scale: No hurt(8/10 initially sitting up edge of bed but 0/10 at rest end of session) Pain Location: neck and abdomen Pain Descriptors / Indicators: Grimacing;Guarding;Restless Pain Intervention(s): Limited activity within patient's tolerance;Monitored during session;Repositioned  Systolic BP in mid to  upper 90's during session.    Home Living Family/patient expects to be discharged to:: Private residence Living Arrangements: Spouse/significant other               Additional Comments: Pt unable to verbalize any home living information.    Prior Function           Comments: Pt unable to verbalize any prior level of function but appears to have been ambulatory prior to hospitalization.     Hand Dominance        Extremity/Trunk Assessment   Upper Extremity Assessment Upper Extremity Assessment: RUE deficits/detail;LUE deficits/detail RUE Deficits / Details: AAROM R shoulder flexion grossly 85 degrees (limited d/t pt demonstrating signs of pain); AAROM R elbow flexion/extension WFL; poor R hand grip strength LUE Deficits / Details: AAROM L shoulder flexion grossly 85 degrees (limited d/t pt demonstrating signs of pain); AAROM L elbow flexion/extension WFL; fair L hand grip strength    Lower Extremity Assessment Lower Extremity Assessment: RLE deficits/detail;LLE deficits/detail RLE Deficits / Details: DF PROM to neutral; R knee AAROM WFL; hip flexion AAROM grossly 80 degrees (limited d/t pt demonstrating signs of pain) LLE Deficits / Details: DF PROM to neutral; L knee AAROM WFL; hip flexion AAROM grossly 80 degrees (limited d/t pt demonstrating signs of pain)    Cervical / Trunk Assessment Cervical / Trunk Assessment: Other exceptions Cervical / Trunk Exceptions: Cervical left lateral rotation at rest; pt appearing with significant discomfort with any minimal PROM attempt towards neutral cervical position  Communication   Communication: Expressive difficulties(Pt mostly mildly nodding head yes/no intermittently to questions; occasionally trying to speak but did not vocalize any words)  Cognition Arousal/Alertness: (Intermittently opening eyes to vc's) Behavior During Therapy: Flat affect Overall Cognitive Status: No family/caregiver present to determine baseline  cognitive functioning                                 General Comments: Pt did not verbalize or physically gesture (nod head yes/no) to determine A&O answers; inconsistent with nodding head yes/no; inconsistent with 1 step commands (requiring vc's and tactile cues)      General Comments  Pt appearing agreeable to PT session.    Exercises     Assessment/Plan    PT Assessment Patient needs continued PT services  PT Problem List Decreased strength;Decreased range of motion;Decreased activity tolerance;Decreased balance;Decreased mobility;Decreased cognition;Decreased knowledge of use of DME;Decreased knowledge of precautions;Cardiopulmonary status limiting activity;Pain;Decreased skin integrity       PT Treatment Interventions DME instruction;Gait training;Stair training;Functional mobility training;Therapeutic activities;Therapeutic exercise;Balance training;Patient/family education;Neuromuscular re-education    PT Goals (Current goals can be found in the Care Plan section)  Acute Rehab PT Goals PT Goal Formulation: Patient unable to participate in goal setting Time For Goal Achievement: 05/28/19 Potential to Achieve Goals: Fair    Frequency Min 2X/week   Barriers to discharge Decreased caregiver support      Co-evaluation               AM-PAC PT "6 Clicks" Mobility  Outcome Measure Help needed turning from your back to your side while in a flat bed without using  bedrails?: A Lot Help needed moving from lying on your back to sitting on the side of a flat bed without using bedrails?: Total Help needed moving to and from a bed to a chair (including a wheelchair)?: Total Help needed standing up from a chair using your arms (e.g., wheelchair or bedside chair)?: Total Help needed to walk in hospital room?: Total Help needed climbing 3-5 steps with a railing? : Total 6 Click Score: 7    End of Session Equipment Utilized During Treatment: Oxygen Activity  Tolerance: Patient limited by fatigue Patient left: in bed;with call bell/phone within reach;with bed alarm set;Other (comment)(B heels elevated via pillows) Nurse Communication: Mobility status;Precautions;Other (comment)(pt's vitals; pt's posterior L ear noted to be bleeding (nurse came to assess)) PT Visit Diagnosis: Other abnormalities of gait and mobility (R26.89);Muscle weakness (generalized) (M62.81);Difficulty in walking, not elsewhere classified (R26.2)    Time: [id number] PT Time Calculation (min) (ACUTE ONLY): 39 min   Charges:   PT Evaluation $PT Eval Low Complexity: 1 Low PT Treatments $Therapeutic Activity: 8-22 mins       Hendricks LimesEmily Bedelia Pong, PT 05/14/19, 10:58 AM [phone]

## 2019-05-14 NOTE — Consult Note (Addendum)
[facility] [place] [facility]   PATIENT NAME: Darren [name]    MR#:  [id number]  DATE OF BIRTH:  [birth year]  DATE OF ADMISSION:  05/12/2019  PRIMARY CARE PHYSICIAN: Marisue Ivan, MD   REQUESTING/REFERRING PHYSICIAN: Dr, Belia Heman  CHIEF COMPLAINT:   Chief Complaint  Patient presents with  . Abdominal Pain   Medical management. HISTORY OF PRESENT ILLNESS:  Darren [name]  is a 73 y.o. male with a known history as below.  Patient is drowsy and lethargic, unable to provide any information.The patient is status post emergent laparotomy for perforated pyloric ulcer.  He had postoperative shock complicated with acute respiratory failure with hypoxia and hypercapnia, severe CHF and severe liver cirrhosis.  He also has renal failure and hyponatremia.  The patient still has hypotension with blood pressure 78/54.  He is off Levophed drip  Dr. Belia Heman request medical management.  PAST MEDICAL HISTORY:   Past Medical History:  Diagnosis Date  . Alcoholic peripheral neuropathy (HCC)   . CAD (coronary artery disease)   . Chronic systolic CHF (congestive heart failure) (HCC)   . HTN (hypertension)   . Hypertension   . MI (myocardial infarction) (HCC) 2013  . Parkinson disease (HCC)   . PVD (peripheral vascular disease) (HCC)     PAST SURGICAL HISTORY:  INGUINAL HERNIA REPAIR SOCIAL HISTORY:   Social History   Tobacco Use  . Smoking status: Current Every Day Smoker  Substance Use Topics  . Alcohol use: Yes    FAMILY HISTORY:  No family history on file.  Both parents deceased.  DRUG ALLERGIES:   Allergies  Allergen Reactions  . Penicillins Hives    REVIEW OF SYSTEMS:   Review of Systems  Unable to perform ROS: Medical condition    MEDICATIONS AT HOME:   Prior to Admission medications   Medication Sig Start Date End Date Taking? Authorizing Provider  aspirin EC 81 MG tablet Take 81 mg by mouth daily.   Yes [provider]   atorvastatin (LIPITOR) 80 MG tablet Take 80 mg by mouth every evening. 03/10/19  Yes [provider]  Cholecalciferol (VITAMIN D-1000 MAX ST) 25 MCG (1000 UT) tablet Take 1,000 Units by mouth daily.   Yes [provider]  gabapentin (NEURONTIN) 300 MG capsule Take 600 mg by mouth 3 (three) times daily. 04/10/19  Yes [provider]  metoprolol succinate (TOPROL-XL) 25 MG 24 hr tablet Take 25 mg by mouth daily. 03/07/19  Yes [provider]  pyridOXINE (B-6) 50 MG tablet Take 50 mg by mouth daily.   Yes [provider]  ramipril (ALTACE) 5 MG capsule Take 5 mg by mouth daily. 04/08/19  Yes [provider]  thiamine (VITAMIN B-1) 100 MG tablet Take 100 mg by mouth daily.   Yes [provider]  vitamin B-12 (CYANOCOBALAMIN) 500 MCG tablet Take 500 mcg by mouth daily.   Yes [provider]  DULoxetine (CYMBALTA) 20 MG capsule Take 20 mg by mouth daily. 05/20/2019 05/30/19  [provider]      VITAL SIGNS:  Blood pressure (!) 99/54, pulse 81, temperature 98 F (36.7 C), temperature source Axillary, resp. rate 20, height 6' (1.829 m), weight 76.2 kg, SpO2 95 %.  PHYSICAL EXAMINATION:  Physical Exam  GENERAL:  73 y.o.-year-old patient lying in the bed with no acute distress.  Critically ill looking.  On oxygen by nasal cannula and NGT. EYES: Pupils equal, round, reactive to light and accommodation. No scleral  icterus. Extraocular muscles intact.  HEENT: Head atraumatic, normocephalic. NECK:  Supple, no jugular venous distention. No thyroid enlargement, no tenderness.  LUNGS: Normal breath sounds bilaterally, no wheezing, rales,rhonchi or crepitation. No use of accessory muscles of respiration.  CARDIOVASCULAR: S1, S2 normal. No murmurs, rubs, or gallops.  ABDOMEN: Soft, nontender, nondistended. Bowel sounds present.  Abdominal drainage in situ. EXTREMITIES: No pedal edema, cyanosis, or clubbing.  NEUROLOGIC: Unable to exam.  PSYCHIATRIC: The patient is awake drowsy and lethargic, unable to answer questions. SKIN: No obvious rash, lesion, or ulcer.   LABORATORY PANEL:   CBC Recent Labs  Lab 05/14/19 0353  WBC 20.1*  HGB 9.9*  HCT 28.3*  PLT 183   ------------------------------------------------------------------------------------------------------------------  Chemistries  Recent Labs  Lab 05/12/19 1000 05/13/19 1056 05/14/19 0353  NA 145 149* 151*  K 3.5 3.3* 4.1  CL 107 111 115*  CO2 29 29 30   GLUCOSE 163* 124* 150*  BUN 65* 60* 68*  CREATININE 1.38* 1.20 1.37*  CALCIUM 8.6* 8.8* 8.6*  MG  --  2.6*  --   AST 62*  --   --   ALT 34  --   --   ALKPHOS 139*  --   --   BILITOT 3.6*  --   --    ------------------------------------------------------------------------------------------------------------------  Cardiac Enzymes No results for input(s): TROPONINI in the last 168 hours. ------------------------------------------------------------------------------------------------------------------  RADIOLOGY:  No results found.    IMPRESSION AND PLAN:   Acute respiratory failure with hypoxia and hypercapnia. The patient was extubated. Continue oxygen by nasal cannula, nebulizer PRN.  Follow-up intensivist.  Hypotension due to sepsis or hypovolemic or cardiogenic. He is off Levophed.  Blood pressures are still low.  IV fluid support.  Follow-up CBC.  Hyponatremia.  Continue D5 IV fluid and free water, follow-up BMP.  Acute renal failure, possible due to ATN, continue IV fluid and follow-up BMP. 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 Lasix as tolerated.  Continue amiodarone.  Follow-up cardiologist recommendation.  S/P emergent laparotomy for perforated pyloric ulcer  Follow-up surgeon for management.  The patient is on NGT feeding.  Alcohol use.  Continue folic acid, thiamine and vitamin. Liver cirrhosis with ascites.  He is on drainage of ascites.   Not a candidate for TIPS per Dr. Tobi BastosAnna. Very poor prognosis, palliative care on board. The patient possibly need LTAC. All the records are reviewed and case discussed with ED provider. Management plans discussed with the patient, family and they are in agreement.  CODE STATUS: Full code.  TOTAL TIME TAKING CARE OF THIS PATIENT: 52 minutes.    Shaune PollackQing Kathalene Sporer M.D on 05/14/2019 at 12:46 PM  Between 7am to 6pm - Pager - [phone]  After 6pm go to [email or web address] - Social research officer, [email or web address] [email or web address]  [facility] [place] [facility]  Office  [phone]  CC: Primary care physician; Marisue IvanLinthavong, Kanhka, MD   Note: This dictation was prepared with Dragon dictation along with smaller phrase technology. Any transcriptional errors that result from this process are unin

## 2019-05-14 NOTE — Progress Notes (Signed)
CRITICAL CARE NOTE  CC  follow up respiratory failure  SUBJECTIVE resp failure improving Severe torticollis of neck-oral valium helping HemoVAC draining approx 500ml  per shift of ascites Oxygen as needed  Needs Speech and PT  BP (!) 88/58   Pulse 78   Temp 98 F (36.7 C) (Axillary)   Resp 18   Ht 6' (1.829 m)   Wt 76.2 kg   SpO2 98%   BMI 22.78 kg/m    I/O last 3 completed shifts: In: 1816.3 [NG/GT:1816.3] Out: 2675 [Urine:1275; Emesis/NG output:200; Drains:1200] No intake/output data recorded.  SpO2: 98 % O2 Flow Rate (L/min): 6 L/min FiO2 (%): 35 %   SIGNIFICANT EVENTS MAJOR EVENTS/TEST RESULTS: 05/06CT abd/pelvis:Free intraperitoneal air. There are 2 areas of abnormal bowel, within the 1st and 2nd portions of the duodenum where there is wall thickening and indistinctness concerning for peptic ulcer disease. Appearance of the liver is suspicious for cirrhosis. Moderate to large volume ascites 05/06 echocardiogram:LVEF 30-35%. Severe hypokinesis of apical and periapical regions. 05/06 Admitted as above post op ex laparotomy with finding of perforated pyloric ulcer 05/07 Palliative Care consultation 05/08 Passed SBT and extubated. Tolerated well initially. Later in day, poor cough with excessive airway secretions. Required reintubation. Post intubation CXR consistent with aspiration (severe bilateral lower lobe airspace disease) 05/09 Full vent support with FiO2 65%. Requiring vasopressors.  05/10 gas exchange improving. Tolerates PS 10 cm H2O. Vasopressor requirement improving. 05/11 UGI Xray: No leak demonstrated. Tube feeds initiated. 05/12 tolerates pressure support ventilation. Chest x-ray continues to work demonstrate ARDS versus pulmonary edema. Furosemide ordered. Tube feeds advanced. Amiodarone initiated for recurrent AF 05/13 Tolerated PS 10 cm H2O. Further diuresis with furosemide. Tolerating TFs - advanced to goal rate of 55 cc/hr. Goals of  care conversation with wife - optimize resp status over next 24-48 hrs, then, one way extubation 05/14 RASS -2. + F/C. NSR. Amiodarone transitioned to enteral - continue loading dose. Passed SBT. Plan one way extubation when wife arrives 5/15 very weak, high risk for aspiration, remains SD status 5/16 work up neck mass-CT head/neck, more critical today 5/17 CT head and NECK-NEG, abd draining 1L per shift 5/18 stable VS-getting ready for Transfer to gen med floor   REVIEW OF SYSTEMS  PATIENT IS UNABLE TO PROVIDE COMPLETE REVIEW OF SYSTEMS DUE TO SEVERE weakness, poor insight  PHYSICAL EXAMINATION:  GENERAL:critically ill appearing HEAD: Normocephalic, atraumatic.  EYES: Pupils equal, round, reactive to light.  No scleral icterus.  MOUTH: Moist mucosal membrane. NECK: Supple. No thyromegaly. No nodules. No JVD.  PULMONARY: +rhonchi CARDIOVASCULAR: S1 and S2. Regular rate and rhythm. No murmurs, rubs, or gallops.  GASTROINTESTINAL: Soft, nontender, -distended. No masses. Positive bowel sounds. No hepatosplenomegaly.  MUSCULOSKELETAL: No swelling, clubbing, or edema.  NEUROLOGIC: alert and awake following commands SKIN:intact,warm,dry  MEDICATIONS: I have reviewed all medications and confirmed regimen as documented   CULTURE RESULTS   Recent Results (from the past 240 hour(s))  Culture, respiratory (non-expectorated)     Status: None   Collection Time: 05/04/19  8:40 AM  Result Value Ref Range Status   Specimen Description   Final    TRACHEAL ASPIRATE Performed at [facility], [address] [place], [place] [address]    Special Requests   Final    NONE Performed at [facility], [address] [place], [place] [address]    Gram Stain   Final    MODERATE WBC PRESENT, PREDOMINANTLY PMN NO SQUAMOUS EPITHELIAL CELLS SEEN NO ORGANISMS  SEEN    Culture   Final    NO GROWTH 2 DAYS Performed at [facility], [address], [place], [place]  [address]    Report Status 05/07/2019 FINAL  Final           Indwelling Urinary Catheter continued, requirement due to   Reason to continue Indwelling Urinary Catheter strict Intake/Output monitoring for hemodynamic instability   Central Line/ continued, requirement due to  Reason to continue [employer] Monitoring of central venous pressure or other hemodynamic parameters and poor IV access        ASSESSMENT AND PLAN SYNOPSIS S/P emergent laparotomy for perforated pyloric ulcer with postoperative shockcomplicated by severe sCHF and severe liver cirrhosis, resp failure slowly improving  Severe ACUTE Hypoxic and Hypercapnic Respiratory Failure-resolving  ACUTE SYSTOLIC CARDIAC FAILURE- EF 30% -oxygen as needed -Lasix as tolerated -follow up cardiac enzymes as indicated -follow up cardiology recs    ACUTE KIDNEY INJURY/Renal Failure -follow chem 7 -follow UO -continue Foley Catheter-assess need -Avoid nephrotoxic agents -Recheck creatinine     NEUROLOGY Avoid heavy sedatives   SHOCK-SEPSIS/HYPOVOLUMIC/CARDIOGENIC -use vasopressors to keep MAP>65 if needed   ID Completed ABX  GI GI PROPHYLAXIS as indicated  NUTRITIONAL STATUS DIET-->TF's as tolerated Constipation protocol as indicated  ENDO - will use ICU hypoglycemic\Hyperglycemia protocol if indicated   ELECTROLYTES -follow labs as needed -replace as needed -pharmacy consultation and following   DVT/GI PRX ordered TRANSFUSIONS AS NEEDED MONITOR FSBS ASSESS the need for LABS as needed   GENERALIZED WEAKNESS-PT and ST needed and follow up  Consider transferring to Gen med floor Consider LTACH  Zavior Thomason Santiago Glad, M.D.  [facility]  Medical Director [facility] Medical Director [facility] Cardio-Pulmonary Department

## 2019-05-15 ENCOUNTER — Inpatient Hospital Stay: Payer: Medicare HMO

## 2019-05-15 ENCOUNTER — Telehealth: Payer: Self-pay

## 2019-05-15 LAB — GLUCOSE, CAPILLARY
Glucose-Capillary: 164 mg/dL — ABNORMAL HIGH (ref 70–99)
Glucose-Capillary: 139 mg/dL — ABNORMAL HIGH (ref 70–99)
Glucose-Capillary: 189 mg/dL — ABNORMAL HIGH (ref 70–99)
Glucose-Capillary: 163 mg/dL — ABNORMAL HIGH (ref 70–99)
Glucose-Capillary: 194 mg/dL — ABNORMAL HIGH (ref 70–99)
Glucose-Capillary: 161 mg/dL — ABNORMAL HIGH (ref 70–99)
Glucose-Capillary: 180 mg/dL — ABNORMAL HIGH (ref 70–99)

## 2019-05-15 LAB — CBC
HCT: 29 % — ABNORMAL LOW (ref 39.0–52.0)
Hemoglobin: 9.4 g/dL — ABNORMAL LOW (ref 13.0–17.0)
MCH: 33.6 pg (ref 26.0–34.0)
MCHC: 32.4 g/dL (ref 30.0–36.0)
MCV: 103.6 fL — ABNORMAL HIGH (ref 80.0–100.0)
Platelets: 195 10*3/uL (ref 150–400)
RBC: 2.8 MIL/uL — ABNORMAL LOW (ref 4.22–5.81)
RDW: 17 % — ABNORMAL HIGH (ref 11.5–15.5)
WBC: 20.2 10*3/uL — ABNORMAL HIGH (ref 4.0–10.5)
nRBC: 0.1 % (ref 0.0–0.2)

## 2019-05-15 LAB — BASIC METABOLIC PANEL
Anion gap: 6 (ref 5–15)
BUN: 68 mg/dL — ABNORMAL HIGH (ref 8–23)
CO2: 29 mmol/L (ref 22–32)
Calcium: 8.3 mg/dL — ABNORMAL LOW (ref 8.9–10.3)
Chloride: 111 mmol/L (ref 98–111)
Creatinine, Ser: 1.37 mg/dL — ABNORMAL HIGH (ref 0.61–1.24)
GFR calc Af Amer: >60 mL/min (ref >60)
GFR calc non Af Amer: 52 mL/min — ABNORMAL LOW (ref >60)
Glucose, Bld: 175 mg/dL — ABNORMAL HIGH (ref 70–99)
Potassium: 3.8 mmol/L (ref 3.5–5.1)
Sodium: 146 mmol/L — ABNORMAL HIGH (ref 135–145)

## 2019-05-15 MED ORDER — SODIUM CHLORIDE 0.9 % IV SOLN
0.0000 ug/min | INTRAVENOUS | Status: DC
  Administered 2019-05-16: 25 ug/min via INTRAVENOUS
  Administered 2019-05-16: 175 ug/min via INTRAVENOUS
  Administered 2019-05-16: 60 ug/min via INTRAVENOUS
  Administered 2019-05-16: 90 ug/min via INTRAVENOUS
  Filled 2019-05-15: qty 10
  Filled 2019-05-15 (×3): qty 1

## 2019-05-15 MED ORDER — PROMETHAZINE HCL 25 MG/ML IJ SOLN: 12.5000 mg | Freq: Four times a day (QID) | INTRAMUSCULAR | Status: DC | PRN

## 2019-05-15 MED ORDER — ONDANSETRON HCL 4 MG/2ML IJ SOLN
INTRAMUSCULAR | Status: AC
  Filled 2019-05-15: qty 2

## 2019-05-15 MED ORDER — SENNOSIDES-DOCUSATE SODIUM 8.6-50 MG PO TABS
2.0000 | ORAL_TABLET | Freq: Two times a day (BID) | ORAL | Status: DC
  Administered 2019-05-15 – 2019-05-16 (×2): 2
  Filled 2019-05-15 (×2): qty 2

## 2019-05-15 MED ORDER — PANTOPRAZOLE SODIUM 40 MG PO PACK: 40.0000 mg | PACK | Freq: Every day | ORAL | Status: DC

## 2019-05-15 MED ORDER — LACTATED RINGERS IV BOLUS
500.0000 mL | Freq: Once | INTRAVENOUS | Status: AC
  Administered 2019-05-15: 500 mL via INTRAVENOUS

## 2019-05-15 MED ORDER — POLYETHYLENE GLYCOL 3350 17 G PO PACK
17.0000 g | PACK | Freq: Once | ORAL | Status: AC
  Administered 2019-05-15: 11:00:00 17 g via ORAL
  Filled 2019-05-15: qty 1

## 2019-05-15 MED ORDER — ALBUMIN HUMAN 25 % IV SOLN
6.2500 g | Freq: Two times a day (BID) | INTRAVENOUS | Status: DC | PRN
  Filled 2019-05-15: qty 50

## 2019-05-15 MED ORDER — ONDANSETRON HCL 4 MG/2ML IJ SOLN
4.0000 mg | Freq: Four times a day (QID) | INTRAMUSCULAR | Status: DC | PRN
  Administered 2019-05-15: 4 mg via INTRAVENOUS

## 2019-05-15 NOTE — Progress Notes (Signed)
Nutrition Follow-up  RD working remotely.  DOCUMENTATION CODES:   Not applicable  INTERVENTION:  Continue Pivot 1.5 Cal at goal rate of 60 mL/hr. Provides 2160 kcal, 135 grams of protein, 1093 mL H2O daily.  With free water flush of 200 mL Q4hrs patient will receive a total of 2293 mL H2O daily including water in tube feeding. Patient also on D5W at 50 mL/hr.  Continue thiamine 453 mg daily, folic acid 1 mg daily, MVI daily.  Recommend daily weights to help in monitoring fluid status.  In setting of high calorie/protein needs, patient will likely need nutrition support for prolonged period of time, especially in setting of high JP drain output. Consider replacing the large (18 Fr.) sump-style tube with a more comfortable small-bore feeding tube (Dobbhoff). This can be placed post-pyloric under fluoroscopy if desired to help reduce risk of aspiration.  NUTRITION DIAGNOSIS:   Inadequate oral intake related to acute illness as evidenced by NPO status.  Ongoing - addressing with TF regimen.  GOAL:   Patient will meet greater than or equal to 90% of their needs  Met with TF regimen.  MONITOR:   Labs, Weight trends, TF tolerance, Skin, I & O's  REASON FOR ASSESSMENT:   Ventilator    ASSESSMENT:   73 y/o male with h/o MI and heavy etoh abuse admitted with perforated ulcer near the pylorus (unable to discern definitively if distal stomach or proximal duodenum) now s/p ex lap with graham patch 5/6. Pt also found to have new liver cirrhosis with 2L ascites noted during surgery   Patient advanced to goal TF rate on 5/19. Free water flush was incresaed to 200 mL Q4hrs on 5/18. Abdomen soft per RN documentation. Last BM was on 05/12/2019 per chart (no BM characteristics documented). Patient still with significant output from JP drain. This will result in loss of fluid, electrolytes, and protein.   Enteral Access: 18 Fr. NGT placed 5/6; terminates in stomach per chest x-ray 5/10: 65 cm  at right nare  TF: pt tolerating Pivot 1.5 Cal at goal rate of 60 mL/hr.  Medications reviewed and include: amiodarone, folic acid 1 mg daily per tube, free water flush 200 mL Q4hrs, Novolog 0-9 units Q4hrs, liquid MVI daily per tube, Protonix, senna-docusate 1 tablet BID per tube, thiamine 100 mg daily per tube, Effexor, D5W at 50 mL/hr (60 grams dextrose, 204 kcal daily).  Labs reviewed: CBG 139-164, Sodium 146, BUN 68, Creatinine 1.37.  I/O: 1150 mL UOP yesterday (0.6 mL/kg/hr); 1700 mL output from JP drain yesterday  Weight trend: no new weight to trend since 5/16  Diet Order:   Diet Order    None     EDUCATION NEEDS:   No education needs have been identified at this time  Skin:  Skin Assessment: Skin Integrity Issues:(ecchymosis; closed incision to abdomen; stg II right buttocks)  Last BM:  05/12/2019 per chart  Height:   Ht Readings from Last 1 Encounters:  05/08/2019 6' (1.829 m)   Weight:   Wt Readings from Last 1 Encounters:  05/11/19 76.2 kg   Ideal Body Weight:  80.9 kg  BMI:  Body mass index is 22.78 kg/m.  Estimated Nutritional Needs:   Kcal:  2200-2500kcal/day   Protein:  110-125g/day   Fluid:  >2.2L/day   Willey Blade, MS, RD, LDN Office: [phone] Pager: [phone] After Hours/Weekend Pager: [phone]

## 2019-05-15 NOTE — Telephone Encounter (Signed)
Patient experience updated on wife's concern about patient status and communication around his transition. This RNCM has asked TOC team and Erika with [facility] to reach out to [place] to see if they can assist with transition of care regarding rehabilitation. Sticky note entered for care team around [facility] status; Hard of hearing and needs glasses to see.

## 2019-05-15 NOTE — TOC Progression Note (Signed)
Transition of Care [facility]) - Progression Note    Patient Details  Name: Darren [name] MRN: [id number] Date of Birth: [birth year]  Transition of [facility]) CM/SW Contact  Barrie Dunker, RN Phone Number: 05/15/2019, 3:24 PM  Clinical Narrative:    Sherron Monday to Cicero Duck with [facility] to check the status for the screening, she stated that they have accepted the patient and that they started the insurance auth request yesterday.  She will follow up with me once she gets authorization, CM to continue to monitor for needs   Expected Discharge Plan: Long Term Acute Care (LTAC) Barriers to Discharge: Continued Medical Work up  Expected Discharge Plan and Services Expected Discharge Plan: Long Term Acute Care (LTAC)       Living arrangements for the past 2 months: Single Family Home                                       Social Determinants of Health (SDOH) Interventions    Readmission Risk Interventions No flowsheet data found.

## 2019-05-15 NOTE — Progress Notes (Signed)
[facility] SURGICAL PROGRESS NOTE  Hospital Day(s): 15.   Post op day(s): 14 Days Post-Op.   Interval History: Patient seen and examined, no acute events or new complaints overnight. Patient with some hypotension yesterday and remains in ICU. Patient is still profoundly weak and only opens eyes to verbal stimuli but does not participate in history. Still with stable but persistently elevated WBC (20.2K). JP with 1.7L out in last 24 hours. Still receiving NGT feedings + free water   Review of Systems:  Unable to obtain secondary to patient participation/weakness    Vital signs in last 24 hours: [min-max] current  Temp:  [98.2 F (36.8 C)-99.6 F (37.6 C)] 98.2 F (36.8 C) (05/20 0746) Pulse Rate:  [80-91] 91 (05/20 0746) Resp:  [18-23] 21 (05/20 0746) BP: (78-104)/(50-68) 101/64 (05/20 0746) SpO2:  [91 %-97 %] 96 % (05/20 0746)     Height: 6' (182.9 cm) Weight: 76.2 kg BMI (Calculated): 22.78   Intake/Output this shift:  No intake/output data recorded.    Physical Exam:  Constitutional: alert,appears very weak, opens eyes to verbal stimuli HEENT: Torticollis (head tilted to the left), NGT in place Respiratory: breathing non-labored at rest, on Hostetter Gastrointestinal: soft, non-tender, and non-distended. JP in RUQwith high output, serosanguinous, consistent with ascites Genitourinary: Foley catheter in place Integumentary:Midline laparotomy incision is CDI with staples, no erythema or drainage   Labs:  CBC Latest Ref Rng & Units 05/15/2019 05/14/2019 05/13/2019  WBC 4.0 - 10.5 K/uL 20.2(H) 20.1(H) 19.0(H)  Hemoglobin 13.0 - 17.0 g/dL 8.2(N) 5.6(O) 10.2(L)  Hematocrit 39.0 - 52.0 % 29.0(L) 28.3(L) 30.9(L)  Platelets 150 - 400 K/uL 195 183 171   CMP Latest Ref Rng & Units 05/15/2019 05/14/2019 05/13/2019  Glucose 70 - 99 mg/dL 130(Q) 657(Q) 469(G)  BUN 8 - 23 mg/dL 29(B) 28(U) 13(K)  Creatinine 0.61 - 1.24 mg/dL 4.40(N) 0.27(O) 5.36  Sodium 135 - 145 mmol/L  146(H) 151(H) 149(H)  Potassium 3.5 - 5.1 mmol/L 3.8 4.1 3.3(L)  Chloride 98 - 111 mmol/L 111 115(H) 111  CO2 22 - 32 mmol/L 29 30 29   Calcium 8.9 - 10.3 mg/dL 8.3(L) 8.6(L) 8.8(L)  Total Protein 6.5 - 8.1 g/dL - - -  Total Bilirubin 0.3 - 1.2 mg/dL - - -  Alkaline Phos 38 - 126 U/L - - -  AST 15 - 41 U/L - - -  ALT 0 - 44 U/L - - -     Imaging studies: No new pertinent imaging studies   Assessment/Plan: (ICD-10's: K25.5) 73 y.o. male with persistent leukocytosis still critically ill with cirrhosis and somewhat improving respiratory status and profound weakness 14 Days Post-Op s/p s/p exploratory laparotomy and repair of perforatedduodenal (just immediately post-pyloric)ulcer with graham patch   - Okay for tube feedings/free water through NGT; should consider post-pyloric feeding tube at this point if concerns for aspiration significant enough              - Monitor leukocytosis; hgb; electrolytes              - monitor bowel function             - Will remove every other staple today - Monitor JP output; replete with albumin (6-8 gtram albumin per 1L output); remains serosanguinous. Output will remain high due to cirrhosis and ascites production, however, if JP is removed, ascites will simply leak from the hole and could potentially result in bacterial peritonitis from tracking --> albumin reordered today              -  Further management per PCCM team; appreciate their help             - awaiting transfer to floor bed; will ask medicine to assume primary care of patient as primary issues are non-surgical at this time; Dr Lady Garyannon discussed this with medicine service yesterday (05/19)  All of the above findings and recommendations were discussed with the medical team.  -- Lynden OxfordZachary Bethsaida Siegenthaler, PA-C [facility] 05/15/2019, 8:05 AM [phone] M-F: 7am - 4pm

## 2019-05-15 NOTE — Progress Notes (Signed)
OT Cancellation Note  Patient Details Name: Darren [name] MRN: [id number] DOB: 02-02-1946    Cancelled Treatment:    Reason Eval/Treat Not Completed: Other (comment). Due to high OT census, OT evaluation may be triaged until next date. Will continue to follow at a distance and initiate as able. Please call with any imminent needs or concerns.   [name], MPH, MS, OTR/L As[facility] [phone] 05/15/19, 9:03 AM

## 2019-05-15 NOTE — Progress Notes (Signed)
Pharmacy Electrolyte Monitoring Consult:  Pharmacy consulted to assist in monitoring and replacing electrolytes in this 73 y.o. male admitted on 05/08/2019. Patient is currently day 12, s/p exploratory laparotomy and repair of perforated duodenal ulcer. Patient with significant history of alcohol abuse causing cirrhosis and ascites. Patient remains in the ICU and receiving PIVOT 1.5 CAL at 64mL/hr; goal rate.   Labs:  Sodium (mmol/L)  Date Value  05/15/2019 146 (H)  08/28/2012 139   Potassium (mmol/L)  Date Value  05/15/2019 3.8  08/28/2012 3.6   Magnesium (mg/dL)  Date Value  49/82/6415 2.6 (H)   Phosphorus (mg/dL)  Date Value  83/08/4075 4.2   Calcium (mg/dL)  Date Value  80/88/1103 8.3 (L)   Calcium, Total (mg/dL)  Date Value  15/94/5859 8.9   Albumin (g/dL)  Date Value  29/24/4628 2.6 (L)  07/12/2012 3.8    Assessment/Plan: Sodium is decreased at 146. Free water flushes increased to Q4hr on 5/18. Continue dextrose 5% at 30mL/hr.    BMP with am labs.   Will replace for goal potassium > 3.6 and goal magnesium ~ 2.   Pharmacy will continue to monitor and adjust per consult.   Simpson,Michael L 05/15/2019 11:19 AM

## 2019-05-15 NOTE — Progress Notes (Signed)
Assisted tele visit to patient with wife.  Sun Wilensky Ferrer, RN  

## 2019-05-15 NOTE — Progress Notes (Signed)
[employer] [facility] [place] [facility]   PATIENT NAME: Darren MangleMitchell [name]    MR#:  [id number]  DATE OF BIRTH:  1946-07-07  SUBJECTIVE:  CHIEF COMPLAINT:   Chief Complaint  Patient presents with  . Abdominal Pain   The patient is confused, on oxygen by nasal cannula 3 L.  On NGT feeding. REVIEW OF SYSTEMS:  Review of Systems  Unable to perform ROS: Medical condition    DRUG ALLERGIES:   Allergies  Allergen Reactions  . Penicillins Hives   VITALS:  Blood pressure (!) 94/53, pulse 89, temperature 98.2 F (36.8 C), temperature source Oral, resp. rate (!) 21, height 6' (1.829 m), weight 76.2 kg, SpO2 92 %. PHYSICAL EXAMINATION:  Physical Exam Constitutional:      Appearance: He is ill-appearing.  HENT:     Head: Normocephalic.  Eyes:     General: No scleral icterus.    Conjunctiva/sclera: Conjunctivae normal.     Pupils: Pupils are equal, round, and reactive to light.  Neck:     Musculoskeletal: Normal range of motion and neck supple.     Vascular: No JVD.     Trachea: No tracheal deviation.  Cardiovascular:     Rate and Rhythm: Normal rate and regular rhythm.     Heart sounds: Normal heart sounds. No murmur. No gallop.   Pulmonary:     Effort: Pulmonary effort is normal. No respiratory distress.     Breath sounds: Normal breath sounds. No stridor. No wheezing, rhonchi or rales.  Abdominal:     General: Bowel sounds are normal. There is no distension.     Palpations: Abdomen is soft.     Tenderness: There is no abdominal tenderness. There is no rebound.     Comments: JP drainage in situ.  Musculoskeletal:        General: No tenderness.  Skin:    Findings: No erythema or rash.  Neurological:     Mental Status: He is alert.     Comments: The patient is confused in the lethargy, he cannot answer questions, unable to exam.    LABORATORY PANEL:  Male CBC Recent Labs  Lab 05/15/19 0457  WBC 20.2*  HGB 9.4*  HCT 29.0*  PLT 195    ------------------------------------------------------------------------------------------------------------------ Chemistries  Recent Labs  Lab 05/12/19 1000 05/13/19 1056  05/15/19 0457  NA 145 149*   < > 146*  K 3.5 3.3*   < > 3.8  CL 107 111   < > 111  CO2 29 29   < > 29  GLUCOSE 163* 124*   < > 175*  BUN 65* 60*   < > 68*  CREATININE 1.38* 1.20   < > 1.37*  CALCIUM 8.6* 8.8*   < > 8.3*  MG  --  2.6*  --   --   AST 62*  --   --   --   ALT 34  --   --   --   ALKPHOS 139*  --   --   --   BILITOT 3.6*  --   --   --    < > = values in this interval not displayed.   RADIOLOGY:  No results found. ASSESSMENT AND PLAN:   Acute respiratory failure with hypoxia and hypercapnia due to aspiration pneumonia and CHF. The patient was intubated and extubated.  On oxygen by nasal cannula 3 L. Continue oxygen by nasal cannula, nebulizer PRN.   He was treated with antibiotics and off  antibiotics. Still leukocytosis.  Follow-up CBC.  Hypotension due to sepsis or hypovolemic or cardiogenic. He is off Levophed.  Blood pressures are soft.  IV fluid support..  Hyponatremia.  Improving with D5 IV fluid and free water, follow-up BMP.  Acute renal failure, possible due to ATN, continue IV fluid and follow-up BMP. 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 Lasix as tolerated.  Continue amiodarone.  S/P emergent laparotomy for perforated pyloric ulcer  Follow-up surgeon for management.  The patient is on NGT feeding.  Alcohol use.  Continue folic acid, thiamine and vitamin. Liver cirrhosis with ascites.  He is on drainage of ascites.  Not a candidate for TIPS per Dr. Tobi Bastos. Very poor prognosis, palliative care on board. The patient needs LTAC. I discussed with Dr. Belia Heman.  All the records are reviewed and case discussed with Care Management/Social Worker. Management plans discussed with the patient, family and they are in agreement.  CODE STATUS: Partial Code   TOTAL TIME TAKING CARE OF THIS PATIENT: 35 minutes.   More than 50% of the time was spent in counseling/coordination of care: YES  POSSIBLE D/C IN ? DAYS, DEPENDING ON CLINICAL CONDITION.   Shaune Pollack M.D on 05/15/2019 at 1:14 PM  Between 7am to 6pm - Pager - [phone]  After 6pm go to [email or web address] - Therapist, nutritional Hospitalists

## 2019-05-15 NOTE — Progress Notes (Signed)
Assisted tele visit to patient with family member.  Kennith Morss Anderson, RN   

## 2019-05-15 NOTE — Progress Notes (Addendum)
Daily Progress Note   Patient Name: Darren [name]       Date: 05/15/2019 DOB: 17-Jul-1946  Age: 73 y.o. MRN#: [id number] Attending Physician: Duanne Guess, MD Primary Care Physician: Marisue Ivan, MD Admit Date: 06-May-2019  Reason for Consultation/Follow-up: Psychosocial/spiritual support  Subjective: Spoke with wife via phone. She voiced concerns about his head leaning to one side, using his glasses, speaking loudly as his hearing is not good, and discussed keeping  his feet on the bed. She voices concern about his neuropathy and home Neurontin.  She states the CCM providers have done well with keeping her updated.   We discussed care moving forward. She states QOL is very important to him. She states he would never want a PEG. She states if he does not improve, "we will go from there", stating his current QOL/functionality would not be acceptable as dignity and independence is important as well. She is hopeful since he was doing so well before this hospitalization, that he will improve given time. Encouraged her to think about time limits to care. She states she will not make any decisions about "letting him go without seeing him first".   She is amenable to an LTACH. 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    Length of Stay: 15  Current Medications: Scheduled Meds:  . amiodarone  200 mg Per Tube Daily  . Chlorhexidine Gluconate Cloth  6 each Topical Daily  . enoxaparin (LOVENOX) injection  40 mg Subcutaneous Daily  . folic acid  1 mg Per Tube Daily  . free water  200 mL Per Tube Q4H  . insulin aspart  0-9 Units Subcutaneous Q4H  . ipratropium-albuterol  3 mL Nebulization Q6H  . multivitamin  15 mL Per Tube Daily  .  [START ON 05/16/2019] pantoprazole sodium  40 mg Per Tube QHS  . senna-docusate  2 tablet Per Tube BID  . thiamine  100 mg Per Tube Daily  . venlafaxine  18.75 mg Per Tube BID    Continuous Infusions: . sodium chloride 250 mL (05/10/19 0301)  . albumin human    . dextrose 50 mL/hr at 05/15/19 0815  . feeding supplement (PIVOT 1.5 CAL) 60 mL/hr at 05/14/19 1656    PRN Meds: sodium  chloride, acetaminophen **OR** [DISCONTINUED] acetaminophen, albumin human, bisacodyl, fentaNYL (SUBLIMAZE) injection, sennosides  Physical Exam Pulmonary:     Effort: Pulmonary effort is normal.  Neurological:     Comments:  Does not speak to this Clinical research associatewriter.               Vital Signs: BP (!) 101/58   Pulse 88   Temp 98.2 F (36.8 C) (Oral)   Resp 20   Ht 6' (1.829 m)   Wt 76.2 kg   SpO2 94%   BMI 22.78 kg/m  SpO2: SpO2: 94 % O2 Device: O2 Device: Nasal Cannula O2 Flow Rate: O2 Flow Rate (L/min): 3 L/min  Intake/output summary:   Intake/Output Summary (Last 24 hours) at 05/15/2019 1158 Last data filed at 05/15/2019 1000 Gross per 24 hour  Intake 3395.65 ml  Output 2800 ml  Net 595.65 ml   LBM: Last BM Date: 05/12/19 Baseline Weight: Weight: 79.8 kg Most recent weight: Weight: 76.2 kg       Palliative Assessment/Data:    Flowsheet Rows     Most Recent Value  Intake Tab  Referral Department  Critical care  Unit at Time of Referral  ICU  Date Notified  05/02/19  Palliative Care Type  New Palliative care  Reason for referral  Clarify Goals of Care  Date of Admission  02-04-19  Date first seen by Palliative Care  05/02/19  # of days Palliative referral response time  0 Day(s)  # of days IP prior to Palliative referral  2  Clinical Assessment  Psychosocial & Spiritual Assessment  Palliative Care Outcomes      Patient Active Problem List   Diagnosis Date Noted  . Pressure injury of skin 05/14/2019  . Alcoholic cirrhosis of liver with ascites (HCC)   . Perforated viscus  02-04-2019    Palliative Care Assessment & Plan     Recommendations/Plan:  [facility].   Recommend palliative to follow at D/C.   Code Status:    Code Status Orders  (From admission, onward)         Start     Ordered   02-04-19 2344  Full code  Continuous     02-04-19 2350        Code Status History    This patient has a current code status but no historical code status.       Prognosis:   Unable to determine  Discharge Planning:  To Be Determined  Care plan was discussed with RN  Thank you for allowing the Palliative Medicine Team to assist in the care of this patient.   Total Time 11:50-1:00 70 min Prolonged Time Billed  yes      Greater than 50%  of this time was spent counseling and coordinating care related to the above assessment and plan.  Morton Stallrystal Lillah Standre, NP  Please contact Palliative Medicine Team phone at [phone] for questions and concerns.

## 2019-05-15 NOTE — Progress Notes (Signed)
OT Cancellation Note  Patient Details Name: Darren [name] MRN: [id number] DOB: 1946/11/29   Cancelled Treatment:    Reason Eval/Treat Not Completed: Other (comment). Consult received, chart reviewed. Pt with low BP this pm. Will hold OT evaluation this date and re-attempt next date.   [name], MPH, MS, OTR/L ascom [phone] 05/15/19, 3:58 PM

## 2019-05-16 LAB — CBC
HCT: 33.1 % — ABNORMAL LOW (ref 39.0–52.0)
Hemoglobin: 10.4 g/dL — ABNORMAL LOW (ref 13.0–17.0)
MCH: 33.1 pg (ref 26.0–34.0)
MCHC: 31.4 g/dL (ref 30.0–36.0)
MCV: 105.4 fL — ABNORMAL HIGH (ref 80.0–100.0)
Platelets: 223 10*3/uL (ref 150–400)
RBC: 3.14 MIL/uL — ABNORMAL LOW (ref 4.22–5.81)
RDW: 17.2 % — ABNORMAL HIGH (ref 11.5–15.5)
WBC: 15.8 10*3/uL — ABNORMAL HIGH (ref 4.0–10.5)
nRBC: 1.6 % — ABNORMAL HIGH (ref 0.0–0.2)

## 2019-05-16 LAB — BASIC METABOLIC PANEL
Anion gap: 13 (ref 5–15)
BUN: 79 mg/dL — ABNORMAL HIGH (ref 8–23)
CO2: 23 mmol/L (ref 22–32)
Calcium: 8.3 mg/dL — ABNORMAL LOW (ref 8.9–10.3)
Chloride: 108 mmol/L (ref 98–111)
Creatinine, Ser: 1.96 mg/dL — ABNORMAL HIGH (ref 0.61–1.24)
GFR calc Af Amer: 39 mL/min — ABNORMAL LOW (ref >60)
GFR calc non Af Amer: 34 mL/min — ABNORMAL LOW (ref >60)
Glucose, Bld: 99 mg/dL (ref 70–99)
Potassium: 4.6 mmol/L (ref 3.5–5.1)
Sodium: 144 mmol/L (ref 135–145)

## 2019-05-16 LAB — LACTIC ACID, PLASMA: Lactic Acid, Venous: 4.9 mmol/L (ref 0.5–1.9)

## 2019-05-16 LAB — GLUCOSE, CAPILLARY
Glucose-Capillary: 132 mg/dL — ABNORMAL HIGH (ref 70–99)
Glucose-Capillary: 78 mg/dL (ref 70–99)
Glucose-Capillary: 124 mg/dL — ABNORMAL HIGH (ref 70–99)
Glucose-Capillary: 129 mg/dL — ABNORMAL HIGH (ref 70–99)

## 2019-05-16 LAB — EXPECTORATED SPUTUM ASSESSMENT W GRAM STAIN, RFLX TO RESP C: Special Requests: NORMAL

## 2019-05-16 LAB — PROCALCITONIN: Procalcitonin: 8.68 ng/mL

## 2019-05-16 LAB — MAGNESIUM: Magnesium: 2.2 mg/dL (ref 1.7–2.4)

## 2019-05-16 MED ORDER — VASOPRESSIN 20 UNIT/ML IV SOLN
0.0300 [IU]/min | INTRAVENOUS | Status: DC
  Administered 2019-05-16 – 2019-05-17 (×2): 0.03 [IU]/min via INTRAVENOUS
  Filled 2019-05-16 (×2): qty 2

## 2019-05-16 MED ORDER — MORPHINE SULFATE (PF) 2 MG/ML IV SOLN
2.0000 mg | INTRAVENOUS | Status: DC | PRN
  Administered 2019-05-16: 14:00:00 2 mg via INTRAVENOUS
  Filled 2019-05-16: qty 1

## 2019-05-16 MED ORDER — HYDROCORTISONE NA SUCCINATE PF 100 MG IJ SOLR
50.0000 mg | Freq: Four times a day (QID) | INTRAMUSCULAR | Status: DC
  Administered 2019-05-16 (×4): 50 mg via INTRAVENOUS
  Filled 2019-05-16 (×3): qty 2

## 2019-05-16 MED ORDER — LACTATED RINGERS IV BOLUS
500.0000 mL | Freq: Once | INTRAVENOUS | Status: AC
  Administered 2019-05-16: 03:00:00 500 mL via INTRAVENOUS

## 2019-05-16 MED ORDER — MORPHINE SULFATE (PF) 2 MG/ML IV SOLN
2.0000 mg | INTRAVENOUS | Status: DC | PRN
  Administered 2019-05-16 – 2019-05-17 (×6): 2 mg via INTRAVENOUS
  Filled 2019-05-16 (×5): qty 1

## 2019-05-16 MED ORDER — MORPHINE SULFATE (PF) 2 MG/ML IV SOLN
INTRAVENOUS | Status: AC
  Administered 2019-05-16: 2 mg via INTRAVENOUS
  Filled 2019-05-16: qty 1

## 2019-05-16 MED ORDER — NOREPINEPHRINE BITARTRATE 1 MG/ML IV SOLN
0.0000 ug/min | INTRAVENOUS | Status: DC
  Administered 2019-05-16: 18:00:00 40 ug/min via INTRAVENOUS
  Administered 2019-05-16: 03:00:00 15 ug/min via INTRAVENOUS
  Administered 2019-05-16 – 2019-05-17 (×2): 40 ug/min via INTRAVENOUS
  Filled 2019-05-16 (×4): qty 16

## 2019-05-16 MED ORDER — SODIUM CHLORIDE 0.9 % IV SOLN
2.0000 g | Freq: Two times a day (BID) | INTRAVENOUS | Status: DC
  Administered 2019-05-16: 01:00:00 2 g via INTRAVENOUS
  Filled 2019-05-16 (×3): qty 2

## 2019-05-16 MED ORDER — CLINDAMYCIN PHOSPHATE 600 MG/50ML IV SOLN
600.0000 mg | Freq: Three times a day (TID) | INTRAVENOUS | Status: DC
  Administered 2019-05-16 (×2): 600 mg via INTRAVENOUS
  Filled 2019-05-16 (×4): qty 50

## 2019-05-16 MED ORDER — CLINDAMYCIN PHOSPHATE 600 MG/50ML IV SOLN
600.0000 mg | Freq: Three times a day (TID) | INTRAVENOUS | Status: DC
  Administered 2019-05-16: 18:00:00 600 mg via INTRAVENOUS
  Filled 2019-05-16 (×4): qty 50

## 2019-05-16 MED ORDER — SODIUM CHLORIDE 0.9 % IV SOLN
0.0000 ug/min | INTRAVENOUS | Status: DC
  Administered 2019-05-16: 23:00:00 200 ug/min via INTRAVENOUS
  Administered 2019-05-16: 220 ug/min via INTRAVENOUS
  Administered 2019-05-16: 225 ug/min via INTRAVENOUS
  Administered 2019-05-16 – 2019-05-17 (×2): 200 ug/min via INTRAVENOUS
  Filled 2019-05-16 (×2): qty 4
  Filled 2019-05-16: qty 40
  Filled 2019-05-16 (×3): qty 4
  Filled 2019-05-16: qty 40

## 2019-05-16 MED ORDER — LACTATED RINGERS IV BOLUS
1000.0000 mL | Freq: Once | INTRAVENOUS | Status: AC
  Administered 2019-05-16: 1000 mL via INTRAVENOUS

## 2019-05-16 MED ORDER — DEXTROSE IN LACTATED RINGERS 5 % IV SOLN
INTRAVENOUS | Status: DC
  Administered 2019-05-16: 03:00:00 via INTRAVENOUS

## 2019-05-16 NOTE — Progress Notes (Signed)
Attempted to reach pt's family Tyee Hoggard to update her that Mr. Kinkade had aspiration event overnight.  Left message for her to call back.  Awaiting call back.  [name], AGACNP-BC [facility] Pager: [phone] Cell: [phone]

## 2019-05-16 NOTE — Progress Notes (Signed)
[facility] SURGICAL PROGRESS NOTE  Hospital Day(s): 16.   Post op day(s): 15 Days Post-Op.   Interval History: Patient seen and examined, overnight patient had an episode of emesis and there was concern for aspiration which CXR was obtained and he was started on Clindamycin and Cefepime. Tube feedings were held and NGT placed to LIS with about 600 ccs out since midnight. He has since become febrile, tachycardic, hypoxic requiring NRB, and hypotensive requiring vasopressor (x3) support. Additionally found to have lactic acidosis receiving IVF resuscitation. His leukocytosis has improved this morning (15K). JP with 1.3L out still consistent with ascites.   Patient's wife is coming to the hospital today to discuss goals of care   Review of Systems:  Constitutional: + Fever Unable to preform secondary to: Weakness/AMS   Vital signs in last 24 hours: [min-max] current  Temp:  [98.2 F (36.8 C)-101.4 F (38.6 C)] 101.4 F (38.6 C) (05/21 0600) Pulse Rate:  [86-108] 101 (05/21 0600) Resp:  [19-25] 23 (05/21 0600) BP: (64-141)/(41-117) 106/46 (05/21 0600) SpO2:  [88 %-100 %] 100 % (05/21 0600) Weight:  [72.5 kg] 72.5 kg (05/21 0500)     Height: 6' (182.9 cm) Weight: 72.5 kg BMI (Calculated): 21.67   Intake/Output this shift:  No intake/output data recorded.    Physical Exam:  Constitutional: alert,appears very weak, opens eyes to verbal stimuli, nonverbal HEENT: Torticollis (head tilted to the left), NGT in place; now to LIS Respiratory: On NRB mask Cardiovascular: Tachycardic Gastrointestinal: soft, non-tender, and non-distended. JP in RUQwith high output, serosanguinous, consistent with ascites Genitourinary: Foley catheter in place Integumentary:Midline laparotomy incision is CDI with every other staple in place, no erythema or drainage   Labs:  CBC Latest Ref Rng & Units 05/16/2019 05/15/2019 05/14/2019  WBC 4.0 - 10.5 K/uL 15.8(H) 20.2(H) 20.1(H)   Hemoglobin 13.0 - 17.0 g/dL 10.4(L) 9.4(L) 9.9(L)  Hematocrit 39.0 - 52.0 % 33.1(L) 29.0(L) 28.3(L)  Platelets 150 - 400 K/uL 223 195 183   CMP Latest Ref Rng & Units 05/16/2019 05/15/2019 05/14/2019  Glucose 70 - 99 mg/dL 99 374(M) 270(B)  BUN 8 - 23 mg/dL 86(L) 54(G) 92(E)  Creatinine 0.61 - 1.24 mg/dL 1.00(F) 1.21(F) 7.58(I)  Sodium 135 - 145 mmol/L 144 146(H) 151(H)  Potassium 3.5 - 5.1 mmol/L 4.6 3.8 4.1  Chloride 98 - 111 mmol/L 108 111 115(H)  CO2 22 - 32 mmol/L 23 29 30   Calcium 8.9 - 10.3 mg/dL 8.3(L) 8.3(L) 8.6(L)  Total Protein 6.5 - 8.1 g/dL - - -  Total Bilirubin 0.3 - 1.2 mg/dL - - -  Alkaline Phos 38 - 126 U/L - - -  AST 15 - 41 U/L - - -  ALT 0 - 44 U/L - - -     Imaging studies:  CXR (05/15/2019) personally reviewed and radiologist report reviewed:  IMPRESSION: 1. Extubated. Enteric tube and left IJ central line remain in place. 2. Improved bilateral ventilation since 05/14/20202020 with residual mid and lower lung predominant streaky and interstitial opacity. 3. No new cardiopulmonary abnormality.   Assessment/Plan: (ICD-10's: K25.5) 73 y.o. male with concerns for aspiration event overnight now critically ill with septic shock (febrile, tachycardic, hypotensive) requiring multiple vasopressor support 15 Days Post-Op s/p exploratory laparotomy and repair of perforatedduodenal (just immediately post-pyloric)ulcer with graham patch   - Continue NPO + NGT to LIS  - Continue IVF resuscitation + albumin as described below.  - Continue ABx (Clindamycin + Cefepime --> Day 1); follow up cultures    -  Monitor lactic acidosis; leukocytosis; hgb; electrolytes - monitor bowel function  - Patient currently too critically ill for CT, although intra-abdominal abscess seems less likely the source of his septic shock. If he clinically improves today could consider CT at that time.  - Monitor JP output; replete with albumin (6-8 gtram albumin per 1L  output); remains serosanguinous. Output will remain high due to cirrhosis and ascites production, however, if JP is removed, ascites will simply leak from the hole and could potentially result in bacterial peritonitis from tracking - Further management per PCCM team; appreciate their help  - Palliative care following; appreciate their assistance; wife coming today to discuss goals of care  All of the above findings and recommendations were discussed with the Medical team (Dr Belia HemanKasa, MD - PCCM).  -- Darren OxfordZachary Shooter Tangen, PA-C [facility] 05/16/2019, 7:36 AM [phone] M-F: 7am - 4pm

## 2019-05-16 NOTE — Progress Notes (Addendum)
Pt had episode of vomiting with concern for possible aspiration as Pt has had increasing FiO2 requirements, and now is noted to be hypotensive. Tube feedings held and NGT to LIS.  Will give 500 ml LR bolus (cautious IVF given recent EF 30-35%) and use vasopressors as necessary.  Will obtain CXR.  Will place on empiric Clindamycin & Cefepime for Aspiration coverage (pt has PCN allergy).   [name], AGACNP-BC [facility] Pager: [phone] Cell: [phone]

## 2019-05-16 NOTE — Progress Notes (Addendum)
Daily Progress Note   Patient Name: Darren [name]       Date: 05/16/2019 DOB: 06/24/1946  Age: 73 y.o. MRN#: [id number] Attending Physician: Duanne Guess, MD Primary Care Physician: Marisue Ivan, MD Admit Date: May 15, 2019  Reason for Consultation/Follow-up: Establishing goals of care  Subjective: Patient is resting in bed on mask. He is tachypneic and appears uncomfortable. Wife is present at bedside. We discussed his current status and concern for discomfort and possible death. She states she does not want him to suffer, and that he would not want to suffer. We discussed quality of life vs quantity.   She states she would like comfort care, but not until tomorrow. Discussed given his effort he may not sustain until tomorrow.  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   Dr. Belia Heman in to speak with wife and this Clinical research associate. After discussion, plans for medications for comfort at this time, while continuing pressors and mask. Will continue life prolonging care as long as patient is comfortable until midnight. At midnight, will stop pressors and shift mask to nasal cannula for full comfort care.       Length of Stay: 16  Current Medications: Scheduled Meds:  . amiodarone  200 mg Per Tube Daily  . Chlorhexidine Gluconate Cloth  6 each Topical Daily  . enoxaparin (LOVENOX) injection  40 mg Subcutaneous Daily  . folic acid  1 mg Per Tube Daily  . hydrocortisone sod succinate (SOLU-CORTEF) inj  50 mg Intravenous Q6H  . insulin aspart  0-9 Units Subcutaneous Q4H  . ipratropium-albuterol  3 mL Nebulization Q6H  . multivitamin  15 mL Per Tube Daily  . pantoprazole sodium  40 mg Per Tube QHS  . senna-docusate  2 tablet Per Tube BID  . thiamine  100 mg Per Tube Daily   . venlafaxine  18.75 mg Per Tube BID    Continuous Infusions: . sodium chloride 250 mL (05/10/19 0301)  . albumin human    . clindamycin (CLEOCIN) IV    . norepinephrine (LEVOPHED) Adult infusion 40 mcg/min (05/16/19 0256)  . phenylephrine (NEO-SYNEPHRINE) Adult infusion 200 mcg/min (05/16/19 0920)  . vasopressin (PITRESSIN) infusion - *FOR SHOCK* 0.03 Units/min (05/16/19 0322)    PRN Meds: sodium chloride, acetaminophen **OR** [DISCONTINUED] acetaminophen, albumin human, bisacodyl, fentaNYL (SUBLIMAZE) injection, ondansetron (ZOFRAN)  IV, promethazine, sennosides  Physical Exam Pulmonary:     Comments: tachypneic Neurological:     Mental Status: He is alert.             Vital Signs: BP (!) 106/46   Pulse (!) 101   Temp (!) 101.4 F (38.6 C) (Axillary)   Resp (!) 23   Ht 6' (1.829 m)   Wt 72.5 kg   SpO2 100%   BMI 21.68 kg/m  SpO2: SpO2: 100 % O2 Device: O2 Device: Venturi Mask O2 Flow Rate: O2 Flow Rate (L/min): 8 L/min  Intake/output summary:   Intake/Output Summary (Last 24 hours) at 05/16/2019 1224 Last data filed at 05/16/2019 0500 Gross per 24 hour  Intake 2218.42 ml  Output 1975 ml  Net 243.42 ml   LBM: Last BM Date: 05/12/19 Baseline Weight: Weight: 79.8 kg Most recent weight: Weight: 72.5 kg       Palliative Assessment/Data:    Flowsheet Rows     Most Recent Value  Intake Tab  Referral Department  Critical care  Unit at Time of Referral  ICU  Date Notified  05/02/19  Palliative Care Type  New Palliative care  Reason for referral  Clarify Goals of Care  Date of Admission  05/20/2019  Date first seen by Palliative Care  05/02/19  # of days Palliative referral response time  0 Day(s)  # of days IP prior to Palliative referral  2  Clinical Assessment  Psychosocial & Spiritual Assessment  Palliative Care Outcomes      Patient Active Problem List   Diagnosis Date Noted  . Pressure injury of skin 05/14/2019  . Alcoholic cirrhosis of liver with  ascites (HCC)   . Perforated viscus 05/13/2019    Palliative Care Assessment & Plan    Recommendations/Plan:  Plans for full comfort care at midnight.   Now DNR/DNI.     Code Status:    Code Status Orders  (From admission, onward)         Start     Ordered   05/09/19 1757  Limited resuscitation (code)  Continuous    Question Answer Comment  In the event of cardiac or respiratory ARREST: Initiate Code Blue, Call Rapid Response Yes   In the event of cardiac or respiratory ARREST: Perform CPR Yes   In the event of cardiac or respiratory ARREST: Perform Intubation/Mechanical Ventilation No   In the event of cardiac or respiratory ARREST: Use NIPPV/BiPAp only if indicated Yes   In the event of cardiac or respiratory ARREST: Administer ACLS medications if indicated Yes   In the event of cardiac or respiratory ARREST: Perform Defibrillation or Cardioversion if indicated Yes      05/09/19 1757        Code Status History    Date Active Date Inactive Code Status Order ID Comments User Context   05/24/2019 2350 05/09/2019 1757 Full Code [id number]  Duanne Guess, MD ED       Prognosis:   Hours - Days following transition to comfort care.   Discharge Planning:  Anticipated Hospital Death  Care plan was discussed with Darren [name]  Thank you for allowing the Palliative Medicine Team to assist in the care of this patient.   Time In: 11:45 Time Out: 1:25 Total Time 1 hour 40 min Prolonged Time Billed  yes      Greater than 50%  of this time was spent counseling and coordinating care related to the above assessment and plan.  Morton Stall,  NP  Please contact Palliative Medicine Team phone at [phone] for questions and concerns.

## 2019-05-16 NOTE — Progress Notes (Signed)
[employer] - [place] [facility]   PATIENT NAME: Darren [name]    MR#:  [id number]  DATE OF BIRTH:  [birth year]  SUBJECTIVE:  CHIEF COMPLAINT:   Chief Complaint  Patient presents with  . Abdominal Pain   The patient aspirated last night.  He is unresponsive, put on nonrebreather Ventimask.  Fever 101.4. REVIEW OF SYSTEMS:  Review of Systems  Unable to perform ROS: Medical condition    DRUG ALLERGIES:   Allergies  Allergen Reactions  . Penicillins Hives   VITALS:  Blood pressure (!) 106/46, pulse (!) 101, temperature (!) 101.4 F (38.6 C), temperature source Axillary, resp. rate (!) 23, height 6' (1.829 m), weight 72.5 kg, SpO2 100 %. PHYSICAL EXAMINATION:  Physical Exam Constitutional:      Appearance: He is ill-appearing.  HENT:     Head: Normocephalic.  Eyes:     General: No scleral icterus.    Conjunctiva/sclera: Conjunctivae normal.     Pupils: Pupils are equal, round, and reactive to light.  Neck:     Musculoskeletal: Normal range of motion and neck supple.     Vascular: No JVD.     Trachea: No tracheal deviation.  Cardiovascular:     Rate and Rhythm: Normal rate and regular rhythm.     Heart sounds: Normal heart sounds. No murmur. No gallop.   Pulmonary:     Effort: Pulmonary effort is normal. No respiratory distress.     Breath sounds: Normal breath sounds. No stridor. No wheezing, rhonchi or rales.  Abdominal:     General: Bowel sounds are normal. There is no distension.     Palpations: Abdomen is soft.     Tenderness: There is no abdominal tenderness. There is no rebound.     Comments: JP drainage in situ.  Musculoskeletal:        General: No tenderness.  Skin:    Findings: No erythema or rash.  Neurological:     Mental Status: He is alert.     Comments: The patient is confused in the lethargy, he cannot answer questions, unable to exam.    LABORATORY PANEL:  Male CBC Recent Labs  Lab 05/16/19 0358  WBC 15.8*   HGB 10.4*  HCT 33.1*  PLT 223   ------------------------------------------------------------------------------------------------------------------ Chemistries  Recent Labs  Lab 05/12/19 1000  05/16/19 0358  NA 145   < > 144  K 3.5   < > 4.6  CL 107   < > 108  CO2 29   < > 23  GLUCOSE 163*   < > 99  BUN 65*   < > 79*  CREATININE 1.38*   < > 1.96*  CALCIUM 8.6*   < > 8.3*  MG  --    < > 2.2  AST 62*  --   --   ALT 34  --   --   ALKPHOS 139*  --   --   BILITOT 3.6*  --   --    < > = values in this interval not displayed.   RADIOLOGY:  Dg Chest Port 1 View  Result Date: 05/16/2019 CLINICAL DATA:  73 year old male with respiratory failure, negative COVID-19 on 05/22/2019. aspiration. EXAM: PORTABLE CHEST 1 VIEW COMPARISON:  05/09/2019 and earlier. FINDINGS: Portable AP semi upright view at 2349 hours. Extubated. Enteric tube remains in place with side hole at the level of the gastric body. Left IJ approach central line remains in place, tip now at the upper  SVC level. Mildly lower lung volumes. Stable cardiac size and mediastinal contours. Improved bilateral ventilation since 05/09/2019 with mid and lower lung predominant streaky and interstitial opacity. No areas of worsening ventilation. No pneumothorax or pleural effusion identified. Paucity of bowel gas. IMPRESSION: 1. Extubated. Enteric tube and left IJ central line remain in place. 2. Improved bilateral ventilation since 05/14/20202020 with residual mid and lower lung predominant streaky and interstitial opacity. 3. No new cardiopulmonary abnormality. Electronically Signed   By: Odessa Fleming M.D.   On: 05/16/2019 00:10   ASSESSMENT AND PLAN:   Acute respiratory failure with hypoxia and hypercapnia due to aspiration pneumonia and CHF. The patient was intubated and extubated.  On oxygen by nasal cannula 3 L. Continue oxygen by nasal cannula, nebulizer PRN.   He was treated with antibiotics and off antibiotics. Better leukocytosis.   Follow-up CBC. Aspiration again.  Put on IV antibiotics.  NGT feedings is on hold.  Hypotension due to sepsis or hypovolemic or cardiogenic. He is off Levophed.  Blood pressures are soft.  IV fluid support..  Hyponatremia.  Improving with D5 IV fluid and free water, follow-up BMP.  Acute renal failure, possible due to ATN, continue IV fluid and follow-up BMP. 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 Lasix as tolerated.  Continue amiodarone.  S/P emergent laparotomy for perforated pyloric ulcer  Follow-up surgeon for management.  Hold NGT feeding.  Alcohol use.  Continue folic acid, thiamine and vitamin. Liver cirrhosis with ascites.  He is on drainage of ascites.  Not a candidate for TIPS per Dr. Tobi Bastos. Very poor prognosis, now in DNR status. The patient is tolerating to proceed with comfort care. All the records are reviewed and case discussed with Care Management/Social Worker. Management plans discussed with the patient, family and they are in agreement.  CODE STATUS: DNR  TOTAL TIME TAKING CARE OF THIS PATIENT: 33 minutes.   More than 50% of the time was spent in counseling/coordination of care: YES  POSSIBLE D/C IN ? DAYS, DEPENDING ON CLINICAL CONDITION.   Shaune Pollack M.D on 05/16/2019 at 2:30 PM  Between 7am to 6pm - Pager - [phone]  After 6pm go to [email or web address] - Therapist, nutritional Hospitalists

## 2019-05-16 NOTE — Progress Notes (Signed)
PT Cancellation Note  Patient Details Name: Darren [name] MRN: [id number] DOB: February 25, 1946   Cancelled Treatment:    Reason Eval/Treat Not Completed: Medical issues which prohibited therapy;Other (comment)(Per nursing, patient is not stable/medically ready this morning for physical therapy due to events overnight. Will follow patient closely and re-attempt at later time/date when medically appropriate. )  Precious Bard, PT, DPT   05/16/2019, 8:43 AM

## 2019-05-16 NOTE — Progress Notes (Signed)
Pt's lactic acid is 4.9, CVP ranging from 3-4.  Will give another 1L Lactated Ringer's bolus and trend lactic acid.   [name], AGACNP-BC [facility] Pager: [phone] Cell: [phone]

## 2019-05-16 NOTE — Progress Notes (Signed)
OT Cancellation Note  Patient Details Name: Darren [name] MRN: [id number] DOB: 11-08-1946   Cancelled Treatment:    Reason Eval/Treat Not Completed: Medical issues which prohibited therapy Per PT and nursing report, patient is not stable/medically ready this morning for occupational therapy due to events overnight. Will follow patient closely and re-attempt at later time/date when medically appropriate.  Thank you for the referral.  Susanne Borders, OTR/L ascom [phone] 05/16/19, 8:51 AM

## 2019-05-16 NOTE — Progress Notes (Signed)
Wife At bedside, clinical status relayed to family  Updated and notified of patients medical condition-  Progressive multiorgan failure with very low chance of meaningful recovery.  Patient is in dying  Process.  Wife understands the situation.  They have consented and agreed to DNR/DNI and would like to proceed with Comfort care measures.  Patient with multiorgan failure wife at bedside understands palliative care consultation also assisted with discussion.  Patient is ready to proceed with comfort care measures Will proceed with comfort care measures within 12 to 24 hours At this time will place Bair hugger for warmth and also proceed with morphine intermittently as needed  If patient continues to struggle ,will initiate full comfort measures in the next 12 to 24 hours. Visitors restrictions are lifted due to active dying process.    Wife very satisfied with Plan of action and management. All questions answered  Lucie Leather, M.D.  [facility]  Medical Director [facility] Medical Director [facility] Cardio-Pulmonary Department

## 2019-05-16 NOTE — Progress Notes (Signed)
RT to patient bedside for scheduled breathing treatment. Patient's SAT in low 80's on 6L [place], RN aware and at bedside. Breathing treatment given and patient placed on High Flow [place] at 8L with improving SATs. Upon further assessment patient was NTS with moderate amount of thick tan/yellow secretions removed. Patient's SATs continue to stay in mid 80's, Oxygen increased to 15L. NP at bedside along with RN. Per NP, patient placed on NRB mask with SAT at 90%. CPT not completed at this time due to low saturation levels and patient condition. Report given to the ICU RT.

## 2019-05-16 NOTE — Progress Notes (Signed)
Assisted tele visit to patient with daughter.  Darren Byard Samson, RN  

## 2019-05-16 NOTE — Progress Notes (Signed)
Assisted tele visit to patient with daughter.  Franz Svec R, RN  

## 2019-05-16 NOTE — Progress Notes (Signed)
The Wife was called multiple times and no answer. Left multiple VM's to call back and get update

## 2019-05-16 NOTE — Progress Notes (Signed)
Assisted tele visit to patient with family member.  Anabeth Chilcott R, RN  

## 2019-05-16 NOTE — Progress Notes (Signed)
CRITICAL CARE NOTE  CC  follow up respiratory failure  SUBJECTIVE critically ill Prognosis is guarded Acute and severe resp failure Febrile episodes +vomiting + aspiration pneumonia Elevated LA Severe shock   BP (!) 106/46   Pulse (!) 101   Temp (!) 101.4 F (38.6 C) (Axillary)   Resp (!) 23   Ht 6' (1.829 m)   Wt 72.5 kg   SpO2 100%   BMI 21.68 kg/m    I/O last 3 completed shifts: In: 3764.4 [I.V.:1187.4; NG/GT:2577] Out: 3375 [Urine:775; Emesis/NG output:600; Drains:2000] No intake/output data recorded.  SpO2: 100 % O2 Flow Rate (L/min): 6 L/min FiO2 (%): 35 %   SIGNIFICANT EVENTS MAJOR EVENTS/TEST RESULTS: 05/06CT abd/pelvis:Free intraperitoneal air. There are 2 areas of abnormal bowel, within the 1st and 2nd portions of the duodenum where there is wall thickening and indistinctness concerning for peptic ulcer disease. Appearance of the liver is suspicious for cirrhosis. Moderate to large volume ascites 05/06 echocardiogram:LVEF 30-35%. Severe hypokinesis of apical and periapical regions. 05/06 Admitted as above post op ex laparotomy with finding of perforated pyloric ulcer 05/07 Palliative Care consultation 05/08 Passed SBT and extubated. Tolerated well initially. Later in day, poor cough with excessive airway secretions. Required reintubation. Post intubation CXR consistent with aspiration (severe bilateral lower lobe airspace disease) 05/09 Full vent support with FiO2 65%. Requiring vasopressors.  05/10 gas exchange improving. Tolerates PS 10 cm H2O. Vasopressor requirement improving. 05/11 UGI Xray: No leak demonstrated. Tube feeds initiated. 05/12 tolerates pressure support ventilation. Chest x-ray continues to work demonstrate ARDS versus pulmonary edema. Furosemide ordered. Tube feeds advanced. Amiodarone initiated for recurrent AF 05/13 Tolerated PS 10 cm H2O. Further diuresis with furosemide. Tolerating TFs - advanced to goal rate of 55  cc/hr. Goals of care conversation with wife - optimize resp status over next 24-48 hrs, then, one way extubation 05/14 RASS -2. + F/C. NSR. Amiodarone transitioned to enteral - continue loading dose. Passed SBT. Plan one way extubation when wife arrives 5/15 very weak, high risk for aspiration, remains SD status 5/16 work up neck mass-CT head/neck, more critical today 5/17 CT head and NECK-NEG, abd draining 1L per shift 5/18 stable VS-getting ready for Transfer to gen med floor 5/20 severe resp failure. Aspiration pneumonia, severe septic shock, critically ill REVIEW OF SYSTEMS  PATIENT IS UNABLE TO PROVIDE COMPLETE REVIEW OF SYSTEMS DUE TO SEVERE CRITICAL ILLNESS   PHYSICAL EXAMINATION:  GENERAL:critically ill appearing, +resp distress HEAD: Normocephalic, atraumatic.  EYES: Pupils equal, round, reactive to light.  No scleral icterus.  MOUTH: Moist mucosal membrane. NECK: Supple. No thyromegaly. No nodules. No JVD.  PULMONARY: +rhonchi, +wheezing CARDIOVASCULAR: S1 and S2. Regular rate and rhythm. No murmurs, rubs, or gallops.  GASTROINTESTINAL: Soft, nontender, -distended. No masses. Positive bowel sounds. No hepatosplenomegaly.  MUSCULOSKELETAL: No swelling, clubbing, or edema.  NEUROLOGIC: obtunded, GCS<8 SKIN:intact,warm,dry  MEDICATIONS: I have reviewed all medications and confirmed regimen as documented   CULTURE RESULTS   Recent Results (from the past 240 hour(s))  Expectorated sputum assessment w rflx to resp cult     Status: None   Collection Time: 05/16/19  4:47 AM  Result Value Ref Range Status   Specimen Description SPUTUM  Final   Special Requests Normal  Final   Sputum evaluation   Final    THIS SPECIMEN IS ACCEPTABLE FOR SPUTUM CULTURE Performed at [facility], [address] [place], [place] [address]    Report Status 05/16/2019 FINAL  Final  IMAGING    Dg Chest Port 1 View  Result Date: 05/16/2019 CLINICAL DATA:  73 year old  male with respiratory failure, negative COVID-19 on 2019-05-14. aspiration. EXAM: PORTABLE CHEST 1 VIEW COMPARISON:  05/09/2019 and earlier. FINDINGS: Portable AP semi upright view at 2349 hours. Extubated. Enteric tube remains in place with side hole at the level of the gastric body. Left IJ approach central line remains in place, tip now at the upper SVC level. Mildly lower lung volumes. Stable cardiac size and mediastinal contours. Improved bilateral ventilation since 05/09/2019 with mid and lower lung predominant streaky and interstitial opacity. No areas of worsening ventilation. No pneumothorax or pleural effusion identified. Paucity of bowel gas. IMPRESSION: 1. Extubated. Enteric tube and left IJ central line remain in place. 2. Improved bilateral ventilation since 05/14/20202020 with residual mid and lower lung predominant streaky and interstitial opacity. 3. No new cardiopulmonary abnormality. Electronically Signed   By: Odessa Fleming M.D.   On: 05/16/2019 00:10       Indwelling Urinary Catheter continued, requirement due to   Reason to continue Indwelling Urinary Catheter strict Intake/Output monitoring for hemodynamic instability   Central Line/ continued, requirement due to  Reason to continue [employer] Monitoring of central venous pressure or other hemodynamic parameters and poor IV access   Ventilator continued, requirement due to severe respiratory failure   Ventilator Sedation RASS 0 to -2      ASSESSMENT AND PLAN SYNOPSIS S/P emergent laparotomy for perforated pyloric ulcer with postoperative shockcomplicated by severe sCHF and severe liver cirrhosis-now with acute and severe resp  failure from acute aspiration of gastric contents with severe septic shock on pressors  Severe ACUTE Hypoxic and Hypercapnic Respiratory Failure -continue Full MV support -continue Bronchodilator Therapy -Wean fio2 as needed  ACUTE SYSTOLIC CARDIAC FAILURE- EF 30% -oxygen as needed -Lasix as  tolerated -follow up cardiac enzymes as indicated -follow up cardiology recs    ACUTE KIDNEY INJURY/Renal Failure -follow chem 7 -follow UO -continue Foley Catheter-assess need -Avoid nephrotoxic agents -Recheck creatinine     NEUROLOGY encephalopathy from sepsis   SHOCK-SEPSIS/HYPOVOLUMIC/CARDIOGENIC -use vasopressors to keep MAP>65 -follow ABG and LA -follow up cultures -emperic ABX -consider stress dose steroids -aggressive IV fluid resuscitation  CARDIAC ICU monitoring  ID -continue IV abx as prescibed -follow up cultures  GI GI PROPHYLAXIS as indicated  NUTRITIONAL STATUS DIET-->NPO Constipation protocol as indicated  ENDO - will use ICU hypoglycemic\Hyperglycemia protocol if indicated   ELECTROLYTES -follow labs as needed -replace as needed -pharmacy consultation and following   DVT/GI PRX ordered TRANSFUSIONS AS NEEDED MONITOR FSBS ASSESS the need for LABS as needed   Critical Care Time devoted to patient care services described in this note is 45  minutes.   Overall, patient is critically ill, prognosis is guarded.  Patient with Multiorgan failure and at high risk for cardiac arrest and death.   Patient is DNI  The wife was called multiple times and left messages multiple times  [facility]  Medical Director [facility] Medical Director [facility] Cardio-Pulmonary Department

## 2019-05-16 NOTE — Progress Notes (Signed)
Took over care of pt. from [place], [place]

## 2019-05-16 NOTE — Progress Notes (Signed)
Assisted tele visit to patient with family member.  Seba Madole Samson, RN  

## 2019-05-16 NOTE — Progress Notes (Signed)
Pharmacy Antibiotic Note  Darren [name] is a 73 y.o. male admitted on 05/05/2019 with possible aspiration pneumonia.  Pharmacy has been consulted for cefepime/clindamycin dosing. Original consult was for unasyn; however, patient has hives reaction to penicillins, therefore spoke to NP and recommended clindamycin for respiratory coverage of gram-positives and anaerobes (MRSA PCR negative) and cefepime for broader coverage of gram-negatives.  Plan: Will start clindamycin 600 mg IV q8h  And cefepime 2g IV q12h per CrCl 30 - 60 ml/min  Height: 6' (182.9 cm) Weight: 167 lb 15.9 oz (76.2 kg) IBW/kg (Calculated) : 77.6  Temp (24hrs), Avg:98.9 F (37.2 C), Min:98.2 F (36.8 C), Max:99.6 F (37.6 C)  Recent Labs  Lab 05/11/19 0420 05/12/19 1000 05/13/19 1056 05/14/19 0353 05/15/19 0457  WBC 23.9* 19.4* 19.0* 20.1* 20.2*  CREATININE 1.23 1.38* 1.20 1.37* 1.37*    Estimated Creatinine Clearance: 54.1 mL/min (A) (by C-G formula based on SCr of 1.37 mg/dL (H)).    Allergies  Allergen Reactions  . Penicillins Hives    Thank you for allowing pharmacy to be a part of this patient's care.  Thomasene Ripple, PharmD, BCPS Clinical Pharmacist 05/16/2019

## 2019-05-17 ENCOUNTER — Telehealth: Payer: Self-pay | Admitting: Internal Medicine

## 2019-05-17 LAB — BLOOD CULTURE ID PANEL (REFLEXED)

## 2019-05-17 MED ORDER — MORPHINE 100MG IN NS 100ML (1MG/ML) PREMIX INFUSION
1.0000 mg/h | INTRAVENOUS | Status: DC
  Filled 2019-05-17: qty 100

## 2019-05-17 MED ORDER — GLYCOPYRROLATE 0.2 MG/ML IJ SOLN: 0.3000 mg | INTRAMUSCULAR | Status: DC | PRN

## 2019-05-17 MED ORDER — LORAZEPAM 2 MG/ML IJ SOLN: 2.0000 mg | INTRAMUSCULAR | Status: DC | PRN

## 2019-05-17 MED ORDER — SCOPOLAMINE 1 MG/3DAYS TD PT72
1.0000 | MEDICATED_PATCH | TRANSDERMAL | Status: DC
  Filled 2019-05-17: qty 1

## 2019-05-18 LAB — CULTURE, RESPIRATORY W GRAM STAIN
Culture: NORMAL
Gram Stain: NONE SEEN
Special Requests: NORMAL

## 2019-05-19 LAB — CULTURE, BLOOD (ROUTINE X 2): Special Requests: ADEQUATE

## 2019-05-21 LAB — CULTURE, BLOOD (ROUTINE X 2)
Culture: NO GROWTH
Special Requests: ADEQUATE

## 2019-05-27 NOTE — Progress Notes (Signed)
Pastoral Care Visit   June 05, 2019 0420  Clinical Encounter Type  Visited With Patient and family together  Visit Type Follow-up;Psychological support;Death;Patient actively dying;Critical Care  Consult/Referral To Chaplain  Spiritual Encounters  Spiritual Needs Emotional;Grief support  Stress Factors  Family Stress Factors Loss   Chap entered room shortly after pt wife arrived due to pt actively dying. Chap sat w/ pt wife, assisted with life review, offered grief support, liaised w/ med team, and escorted pt wife to care afterward.  Pt wife expressed dissatisfaction with level of communication while pt was in hospital but was very thankful for the level of care provided in these final hours, especially noting her gratitude for not being rushed out after her husband's death.    Milinda Antis, [address]

## 2019-05-27 NOTE — Progress Notes (Signed)
Pt is having frequent episodes of non-sustained V-tach and BP is decreasing despite 3 vasopressors.  Have notified pt's wife [name] that her husband is declining and that she needs to come to her husbands bedside.  Upon her arrival we will transition him to full comfort care.   [name], AGACNP-BC [facility] Pager: [phone] Cell: [phone]

## 2019-05-27 NOTE — Progress Notes (Signed)
Patient's wife Darren [name] came to bedside to visit her husband.  Initially plan was to withdraw care and transition to comfort care around midnight, however Darren [name] would like to wait till approximately 10:00 am later today to transition to comfort care.  In discussion with Darren [name], plan is that we will continue current supporitive measures of Venturi mask and vasopressors to maintain blood pressure with no further escalation of care, and PRN morphine to keep the patient as comfortable as possible till 10:00 am.  However if patient were to decompensate/decline further or appear that he is suffering, we will then call Darren [name] back to bedside and transition to comfort measures at that time. Darren [name] is in agreement with plan.    [name], AGACNP-BC [facility] Pager: [phone] Cell: [phone]

## 2019-05-27 NOTE — Progress Notes (Signed)
68 - pt with rhythm change.  Irregular rhythm with runs of PVCs.  MAP 40s.

## 2019-05-27 NOTE — Death Summary Note (Signed)
DEATH SUMMARY   Patient Details  Name: Darren [name] MRN: [id number] DOB: 08-Feb-1946  Admission/Discharge Information   Admit Date:  May 24, 2019  Date of Death: Date of Death: 2019/06/10  Time of Death: Time of Death: 0437  Length of Stay: May 06, 2023  Referring Physician: Marisue Ivan, MD   Reason(s) for Hospitalization  Perforated Viscus Septic shock Peritonitis Acute Kidney Injury Cirrhosis Acute Decompensated Systolic CHF  Diagnoses  Preliminary cause of death:   Acute Hypoxic Respiratory Failure  Aspiration Pneumonia Secondary Diagnoses (including complications and co-morbidities):  Active Problems:   Perforated viscus   Alcoholic cirrhosis of liver with ascites (HCC)   Pressure injury of skin Septic Shock Acute Kidney Injury Acute Decompensated Systolic CHF  Brief Hospital Course (including significant findings, care, treatment, and services provided and events leading to death)    [name] is a 73 year old male with a past medical history notable for hypertension and MI who presented to [facility] ED on 2019/05/24 with complaints of abdominal pain.  The abdominal pain was abrupt in onset, beginning around 5 hours prior to presentation.  He described it as midepigastric, sharp, 10 out of 10. His COVID-19 PCR is NEGATIVE. CTA of the chest/abdomen/pelvis noted free air and thickening in the first and second portion of the duodenum, concerning for perforated duodenal ulcer.  General surgery was consulted who took him emergently to the OR for exploratory laparotomy and repair of the perforation.  The ulcer was found near the pylorus, and approximately 2 L of ascites within the abdominal cavity were noted, and the liver was noted to be nodular and cirrhotic.  He returned to ICU post procedure, remained intubated due to hemodynamic instability with septic & hypovolemic shock.  He was extubated on 5/8 and tolerated it well initially, but later in the day he required reintubation due to  aspiration.  He required aggressive diuresis.  He was made a DNI on 5/14 with one way extubation.    On 5/20 he vomited and aspirated, and subsequently developed Acute Hypoxic Respiratory Failure and severe Septic Shock requiring 3 vasopressors.  He continued to decline with very low chance of meaningful recovery.  His wife chose to withdraw care and proceed with comfort measures only on 2023-06-10.  He passed very shortly after withdrawal of care.   Pertinent Labs and Studies  Significant Diagnostic Studies Dg Abd 1 View  Result Date: 2019/05/25 CLINICAL DATA:  Nasogastric tube EXAM: ABDOMEN - 1 VIEW COMPARISON:  Chest CT earlier today FINDINGS: Surgical drain is in place over the epigastrium. There is an enteric tube with tip at the mid stomach. Contrast is still being excreted from the kidneys related to prior CT. Cholelithiasis. IMPRESSION: Orogastric tube in good position. Electronically Signed   By: Marnee Spring M.D.   On: 05/25/2019 05:38   Ct Head Wo Contrast  Result Date: 05/11/2019 CLINICAL DATA:  Altered mental status EXAM: CT HEAD WITHOUT CONTRAST TECHNIQUE: Contiguous axial images were obtained from the base of the skull through the vertex without intravenous contrast. COMPARISON:  MRI head 05/29/2018 FINDINGS: Brain: Moderate atrophy. Negative for hydrocephalus. Negative for acute infarct, hemorrhage, or mass. Vascular: Negative for hyperdense vessel. Skull: Negative Sinuses/Orbits: Mucosal edema right maxillary sinus and left ethmoid sinus. Mild left mastoid effusion. Negative orbit Other: None IMPRESSION: Moderate atrophy.  No acute abnormality. Electronically Signed   By: Marlan Palau M.D.   On: 05/11/2019 18:38   Ct Soft Tissue Neck Wo Contrast  Result Date: 05/11/2019 CLINICAL DATA:  Abnormal neck  position.  Recently extubated. EXAM: CT NECK WITHOUT CONTRAST TECHNIQUE: Multidetector CT imaging of the neck was performed following the standard protocol without intravenous contrast.  COMPARISON:  None. FINDINGS: Pharynx and larynx: Normal. No mass or swelling. NG tube in the pharynx extending into the esophagus. Salivary glands: No inflammation, mass, or stone. Thyroid: Negative Lymph nodes: No enlarged lymph nodes in the neck Vascular: Carotid artery calcification bilaterally. Limited vascular evaluation without intravenous contrast. Left jugular vein central venous catheter extending into the SVC. Atherosclerotic aortic arch. Limited intracranial: CT head from today reported separately Visualized orbits: Negative Mastoids and visualized paranasal sinuses: Mucosal edema right maxillary sinus. Skeleton: The head is tilted and turned to the left compatible with torticollis. Cervical spondylosis. No acute skeletal abnormality. Upper chest: Bilateral upper lobe infiltrates as noted on chest x-ray Other: None IMPRESSION: 1. 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 No acute skeletal abnormality. Cervical spondylosis. 2. Atherosclerotic disease 3. Negative for mass in the neck. Electronically Signed   By: Marlan Palau M.D.   On: 05/11/2019 18:35   Dg Chest Port 1 View  Result Date: 05/16/2019 CLINICAL DATA:  73 year old male with respiratory failure, negative COVID-19 on May 03, 2019. aspiration. EXAM: PORTABLE CHEST 1 VIEW COMPARISON:  05/09/2019 and earlier. FINDINGS: Portable AP semi upright view at 2349 hours. Extubated. Enteric tube remains in place with side hole at the level of the gastric body. Left IJ approach central line remains in place, tip now at the upper SVC level. Mildly lower lung volumes. Stable cardiac size and mediastinal contours. Improved bilateral ventilation since 05/09/2019 with mid and lower lung predominant streaky and interstitial opacity. No areas of worsening ventilation. No pneumothorax or pleural effusion identified. Paucity of bowel gas. IMPRESSION: 1. Extubated. Enteric tube and left IJ central line remain in place. 2. Improved bilateral  ventilation since 05/14/20202020 with residual mid and lower lung predominant streaky and interstitial opacity. 3. No new cardiopulmonary abnormality. Electronically Signed   By: Odessa Fleming M.D.   On: 05/16/2019 00:10   Dg Chest Port 1 View  Result Date: 05/09/2019 CLINICAL DATA:  Respiratory failure. EXAM: PORTABLE CHEST 1 VIEW COMPARISON:  Radiograph yesterday. FINDINGS: Endotracheal tube, enteric tube, and left central line remain in place. Slight improvement in bilateral diffuse reticular opacities. Improved retrocardiac aeration with decreasing consolidation. No visualized pneumothorax. IMPRESSION: Slight improvement in diffuse bilateral reticular opacities. Improved retrocardiac aeration with decreasing left lung base consolidation. Electronically Signed   By: Narda Rutherford M.D.   On: 05/09/2019 03:16   Dg Chest Port 1 View  Result Date: 05/08/2019 CLINICAL DATA:  73 year old male with respiratory failure. Negative for COVID-19 on 03-May-2019. EXAM: PORTABLE CHEST 1 VIEW COMPARISON:  05/06/2019 and earlier. FINDINGS: Portable AP semi upright view at 0439 hours. Stable lines and tubes. Stable lung volumes and mediastinal contours. Coarse bilateral diffuse pulmonary interstitial opacity is slightly less confluent about the hila. There remains dense left lung base opacity obscuring the left hemidiaphragm. No pneumothorax. No large pleural effusion. Paucity bowel gas in the upper abdomen. Stable visualized osseous structures. IMPRESSION: 1.  Stable lines and tubes. 2. Mildly improved perihilar ventilation since 05/06/2019 with continued diffuse coarse interstitial opacity and left lower lobe collapse or consolidation. Electronically Signed   By: Odessa Fleming M.D.   On: 05/08/2019 06:25   Dg Chest Port 1 View  Result Date: 05/06/2019 CLINICAL DATA:  73 year old male with respiratory failure. Negative for COVID-19. On 03-May-2019. EXAM: PORTABLE CHEST 1 VIEW COMPARISON:  05/05/2019 and  earlier. FINDINGS:  Portable AP semi upright view at 0622 hours. Endotracheal tube tip in good position between the clavicles and carina. Enteric tube courses to the left upper quadrant, tip not included. Stable left IJ central line. Coarse and confluent bilateral patchy and interstitial pulmonary opacity in both lungs. Ventilation has mildly improved since 05/04/2019. No pneumothorax or pleural effusion is evident. Normal cardiac size and mediastinal contours. Calcified aortic atherosclerosis. IMPRESSION: 1. Stable lines and tubes. 2. Continued diffuse bilateral pulmonary opacity, mildly improved ventilation since 05/03/18. Electronically Signed   By: Odessa FlemingH  Hall M.D.   On: 05/06/2019 07:34   Dg Chest Port 1 View  Result Date: 05/05/2019 CLINICAL DATA:  RESP FAILURE EXAM: PORTABLE CHEST 1 VIEW COMPARISON:  None. FINDINGS: Endotracheal tube and central venous line unchanged. NG tube is in the stomach. Stable cardiac silhouette. Dense bilateral diffuse airspace disease.  No pneumothorax. IMPRESSION: 1. Stable support apparatus. 2. No interval change. 3. Dense diffuse bilateral airspace disease. Electronically Signed   By: Genevive BiStewart  Edmunds M.D.   On: 05/05/2019 05:56   Dg Chest Port 1 View  Result Date: 05/04/2019 CLINICAL DATA:  Intubation. EXAM: PORTABLE CHEST 1 VIEW COMPARISON:  Radiograph May 04, 2019. FINDINGS: The heart size and mediastinal contours are within normal limits. Endotracheal and nasogastric tubes are unchanged in position. Left internal jugular catheter is unchanged. Stable bilateral diffuse lung opacities are noted most consistent with pneumonia. No pneumothorax or definite pleural effusion is noted. The visualized skeletal structures are unremarkable. IMPRESSION: Stable support apparatus. Stable bilateral lung opacities are noted most consistent with pneumonia. Electronically Signed   By: Lupita RaiderJames  Green Jr M.D.   On: 05/04/2019 14:18   Portable Chest Xray  Result Date: 05/04/2019 CLINICAL DATA:  Endotracheal tube  present. EXAM: PORTABLE CHEST 1 VIEW COMPARISON:  05/03/2019 FINDINGS: Endotracheal tube terminates approximately 5 cm above the carina. Enteric tube courses into the abdomen with side hole projecting over the proximal stomach and tip not imaged. Left jugular catheter terminates over the upper SVC. The cardiomediastinal silhouette is unchanged. Extensive airspace consolidation bilaterally, greatest in the mid and lower lungs, has not significantly changed. There is likely a persistent small left pleural effusion. No pneumothorax is identified. IMPRESSION: Unchanged extensive bilateral airspace consolidation. Electronically Signed   By: Sebastian AcheAllen  Grady M.D.   On: 05/04/2019 07:37   Portable Chest X-ray  Result Date: 05/03/2019 CLINICAL DATA:  73 y/o  M; ETT. EXAM: PORTABLE CHEST 1 VIEW COMPARISON:  05/02/2019 chest radiograph FINDINGS: Left central venous catheter tip projects over mid SVC. Endotracheal tube tip 4.7 cm above carina. Enteric tube tip extends below the field of view into the abdomen. Diffuse consolidations greatest in mid and lower lung zones are increased from prior chest radiograph. No pneumothorax. Probable small left effusion. Stable cardiac silhouette given projection and technique. Bones are unremarkable. IMPRESSION: 1. Support apparatus as above. 2. Diffuse consolidations greatest in mid and lower lung zones are increased from prior chest radiograph. Electronically Signed   By: Mitzi HansenLance  Furusawa-Stratton M.D.   On: 05/03/2019 22:27   Dg Chest Port 1 View  Result Date: 05/02/2019 CLINICAL DATA:  Acute respiratory failure EXAM: PORTABLE CHEST 1 VIEW COMPARISON:  05/12/2019 FINDINGS: Support devices are stable. Heart is borderline in size. Improving aeration in the lung bases with decreasing bilateral airspace disease and probable effusions. No acute bony abnormality. IMPRESSION: Improving aeration in the lung bases with decreasing bibasilar opacities and effusions. Electronically Signed   By:  Charlett NoseKevin  Dover M.D.  On: 05/02/2019 02:02   Dg Chest Port 1 View  Result Date: 05/24/2019 CLINICAL DATA:  Status post central line placement today. EXAM: PORTABLE CHEST 1 VIEW COMPARISON:  Single-view of the chest earlier today. FINDINGS: New left IJ approach central venous catheter is in place with the tip in the mid to lower superior vena cava. ETT and NG tube again seen. No pneumothorax. Bibasilar airspace disease has worsened. Heart size is upper normal. No acute bony abnormality. IMPRESSION: Left IJ catheter tip projects in the mid to lower superior vena cava. No pneumothorax. Increased bibasilar airspace disease. Electronically Signed   By: Drusilla Kanner M.D.   On: 05/23/2019 10:51   Dg Chest Port 1 View  Result Date: 05/15/2019 CLINICAL DATA:  Attempted right internal jugular CVC placement but unable to thread guidewire. Evaluate for complication. EXAM: PORTABLE CHEST 1 VIEW COMPARISON:  Chest x-ray from same day at 4:56 a.m. FINDINGS: Unchanged endotracheal and enteric tubes. Stable cardiomediastinal silhouette. Peripheral interstitial coarsening is unchanged. Unchanged bibasilar atelectasis. No focal consolidation, pleural effusion, or pneumothorax. No acute osseous abnormality. IMPRESSION: 1. No pneumothorax. 2. Unchanged bibasilar atelectasis. Electronically Signed   By: Obie Dredge M.D.   On: 05/05/2019 07:24   Portable Chest X-ray  Result Date: 04/27/2019 CLINICAL DATA:  Intubation EXAM: PORTABLE CHEST 1 VIEW COMPARISON:  Chest CT from yesterday FINDINGS: Endotracheal tube with tip at the clavicular heads. The orogastric tube at least reaches the stomach. Interstitial coarsening attributed atelectasis based on prior CT and low lung volumes. There may also be developing edema. There is coronary calcification and a partially calcified left ventricular apex from remote infarct. IMPRESSION: 1. Unremarkable hardware positioning. 2. Low lung volumes with atelectasis. There may be developing  edema. Electronically Signed   By: Marnee Spring M.D.   On: 05/18/2019 05:37   Dg Kayleen Memos W Single Cm (sol Or Thin Ba)  Result Date: 05/06/2019 CLINICAL DATA:  Leak check, s/p Cheree Ditto patch repair EXAM: WATER SOLUBLE UPPER GI SERIES TECHNIQUE: Single-column upper GI series was performed using water soluble contrast. CONTRAST:  60 mL Omnipaque water-soluble iodinated contrast per NG tube COMPARISON:  CT abdomen pelvis, May 07, 2019 FLUOROSCOPY TIME:  Fluoroscopy Time: 1:00 Number of Acquired Spot Images: 16 FINDINGS: Examination is somewhat limited by patient inability to position. A total of 60 mL Omnipaque water-soluble iodinated contrast was administered by NG tube under gentle syringe pressure, with an additional volume of approximately 150 mL sterile water. Following initial injection, patient was rolled to right oblique to the extent possible, to promote contrast flow through the gastric antrum, pylorus, and duodenum. No extraluminal contrast or other evidence of contrast leak identified with a small volume of contrast opacification through the duodenal jejunal junction. IMPRESSION: Examination is somewhat limited by patient inability to position. A total of 60 mL Omnipaque water-soluble iodinated contrast was administered by NG tube under gentle syringe pressure, with an additional volume of approximately 150 mL sterile water. Following initial injection, patient was rolled to right oblique to the extent possible, to promote contrast flow through the gastric antrum, pylorus, and duodenum. No extraluminal contrast or other evidence of contrast leak identified with a small volume of contrast opacification through the duodenal jejunal junction. Electronically Signed   By: Lauralyn Primes M.D.   On: 05/06/2019 12:29   Ct Angio Chest/abd/pel For Dissection W And/or Wo Contrast  Result Date: 05-07-2019 CLINICAL DATA:  Chest and abdominal pain. EXAM: CT ANGIOGRAPHY CHEST, ABDOMEN AND PELVIS TECHNIQUE: Multidetector CT  imaging through the chest,  abdomen and pelvis was performed using the standard protocol during bolus administration of intravenous contrast. Multiplanar reconstructed images and MIPs were obtained and reviewed to evaluate the vascular anatomy. CONTRAST:  ISOVUE-370 IOPAMIDOL (ISOVUE-370) INJECTION 76% COMPARISON:  Chest CT 04/05/2018 FINDINGS: CTA CHEST FINDINGS Cardiovascular: Diffuse aortic atherosclerosis and coronary artery calcifications. No aneurysm or dissection. Heart is mildly enlarged. Calcification and wall thickening at the left ventricle apex compatible with prior infarct. Mediastinum/Nodes: No mediastinal, hilar, or axillary adenopathy. Trachea and esophagus are unremarkable. Thyroid unremarkable. Lungs/Pleura: Dependent opacities noted in both lower lungs/lung bases compatible with dependent atelectasis or scarring. No effusions. Musculoskeletal: Chest wall soft tissues are unremarkable. No acute bony abnormality. Review of the MIP images confirms the above findings. CTA ABDOMEN AND PELVIS FINDINGS VASCULAR Aorta: Atherosclerotic calcifications throughout the abdominal aorta. No aneurysm or dissection. Celiac: Patent without evidence of aneurysm, dissection, vasculitis or significant stenosis. SMA: Patent without evidence of aneurysm, dissection, vasculitis or significant stenosis. Renals: Calcified plaque at the origin of both renal arteries. Suspect mild stenosis at the origin of the left renal artery. Right renal artery appears widely patent. Small accessory left renal artery noted. IMA: Patent. Inflow: Atherosclerotic calcifications throughout the iliac vessels. No aneurysm, dissection or significant stenosis. Veins: No obvious venous abnormality within the limitations of this arterial phase study. Review of the MIP images confirms the above findings. NON-VASCULAR Hepatobiliary: Small gallstones within the gallbladder. Liver appears shrunken with probable micro nodularity along the surface  suggesting cirrhosis. Gallbladder is moderately distended. No biliary ductal dilatation. Pancreas: No focal abnormality or ductal dilatation. Spleen: No focal abnormality.  Normal size. Adrenals/Urinary Tract: Left renal cysts. No suspicious renal or adrenal mass. No hydronephrosis. Urinary bladder unremarkable. Stomach/Bowel: Sigmoid diverticulosis. No active diverticulitis. There is abnormal wall thickening within the cecum and ascending colon concerning for colitis. There is indistinctness and wall thickness within the 1st and 2nd portions of the duodenum. This is concerning for duodenitis, likely peptic ulcer disease. No evidence of bowel obstruction. Lymphatic: No adenopathy Reproductive: No visible focal abnormality. Other: Moderate to large volume ascites in the abdomen and pelvis. There is free air anteriorly within the upper abdomen and in the porta hepatis. Musculoskeletal: No acute bony abnormality. Review of the MIP images confirms the above findings. IMPRESSION: Diffuse aortic atherosclerosis. No evidence of aneurysm or dissection. Diffuse coronary artery disease. Cardiomegaly. 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 Dependent atelectasis or scarring in the lower lobes. Free intraperitoneal air. There are 2 areas of abnormal bowel, within the 1st and 2nd portions of the duodenum where there is wall thickening and indistinctness concerning for peptic ulcer disease. I suspect this is the source of the perforation and free air. However, the cecum and ascending colon also appears thick walled concerning for colitis. Appearance of the liver is suspicious for cirrhosis. Recommend clinical correlation. Moderate to large volume ascites. Cholelithiasis. Sigmoid diverticulosis. Critical Value/emergent results were called by telephone at the time of interpretation on 05-21-2019 at 11:33 pm to Dr. Phineas Semen , who verbally acknowledged these results.  Electronically Signed   By: Charlett Nose M.D.   On: May 21, 2019 23:36    Microbiology Recent Results (from the past 240 hour(s))  Expectorated sputum assessment w rflx to resp cult     Status: None   Collection Time: 05/16/19  4:47 AM  Result Value Ref Range Status   Specimen Description SPUTUM  Final   Special Requests Normal  Final   Sputum evaluation  Final    THIS SPECIMEN IS ACCEPTABLE FOR SPUTUM CULTURE Performed at [facility], [address] [place], [place] [address]    Report Status 05/16/2019 FINAL  Final  Culture, respiratory     Status: None (Preliminary result)   Collection Time: 05/16/19  4:47 AM  Result Value Ref Range Status   Specimen Description   Final    SPUTUM Performed at [facility], [address], [place], [place] [address]    Special Requests   Final    Normal Reflexed from [id number] Performed at [facility], [address] [place], [place] [address]    Gram Stain   Final    NO WBC SEEN RARE GRAM POSITIVE COCCI IN PAIRS RARE GRAM POSITIVE RODS Performed at [facility], [address], [place], [place] [address]    Culture PENDING  Incomplete   Report Status PENDING  Incomplete  CULTURE, BLOOD (ROUTINE X 2) w Reflex to ID Panel     Status: None (Preliminary result)   Collection Time: 05/16/19  5:36 AM  Result Value Ref Range Status   Specimen Description BLOOD BLOOD RIGHT FOREARM  Final   Special Requests   Final    BOTTLES DRAWN AEROBIC AND ANAEROBIC Blood Culture adequate volume   Culture   Final    NO GROWTH <12 HOURS Performed at [facility], [address] [place], [place] [address]    Report Status PENDING  Incomplete  CULTURE, BLOOD (ROUTINE X 2) w Reflex to ID Panel     Status: None (Preliminary result)   Collection Time: 05/16/19  5:47 AM  Result Value Ref Range Status   Specimen Description BLOOD RIGHT ANTECUBITAL  Final   Special Requests   Final    BOTTLES DRAWN AEROBIC AND  ANAEROBIC Blood Culture adequate volume   Culture   Final    NO GROWTH <12 HOURS Performed at [facility], [address] [place], [place] [address]    Report Status PENDING  Incomplete    Lab Basic Metabolic Panel: Recent Labs  Lab 05/12/19 1000 05/13/19 1056 05/14/19 0353 05/15/19 0457 05/16/19 0358  NA 145 149* 151* 146* 144  K 3.5 3.3* 4.1 3.8 4.6  CL 107 111 115* 111 108  CO2 GLUCOSE 163* 124* 150* 175* 99  BUN 65* 60* 68* 68* 79*  CREATININE 1.38* 1.20 1.37* 1.37* 1.96*  CALCIUM 8.6* 8.8* 8.6* 8.3* 8.3*  MG  --  2.6*  --   --  2.2  PHOS  --  4.2  --   --   --    Liver Function Tests: Recent Labs  Lab 05/11/19 0420 05/12/19 1000  AST 71* 62*  ALT 35 34  ALKPHOS 140* 139*  BILITOT 4.3* 3.6*  PROT 4.7* 4.8*  ALBUMIN 2.5* 2.6*   No results for input(s): LIPASE, AMYLASE in the last 168 hours. Recent Labs  Lab 05/14/19 0353  AMMONIA 36*   CBC: Recent Labs  Lab 05/11/19 0420 05/12/19 1000 05/13/19 1056 05/14/19 0353 05/15/19 0457 05/16/19 0358  WBC 23.9* 19.4* 19.0* 20.1* 20.2* 15.8*  NEUTROABS 20.4* 15.9*  --  16.4*  --   --   HGB 9.9* 9.8* 10.2* 9.9* 9.4* 10.4*  HCT 28.9* 29.6* 30.9* 28.3* 29.0* 33.1*  MCV 104.0* 100.7* 105.8* 106.8* 103.6* 105.4*  PLT 100* 129* 171 183 195 223   Cardiac Enzymes: No results for input(s): CKTOTAL, CKMB, CKMBINDEX, TROPONINI in the last  168 hours. Sepsis Labs: Recent Labs  Lab 05/13/19 1056 05/14/19 0353 05/15/19 0457 05/16/19 0358  PROCALCITON  --   --   --  8.68  WBC 19.0* 20.1* 20.2* 15.8*  LATICACIDVEN  --   --   --  4.9*    Procedures/Operations  5/6>>Exploratory Laparotomy with repair perforated ulcer; remained intubated post procedure due to hemodynamic instability 5/6>> Left IJ CVC placed 5/8>>Extubated; Required reintubation later in the day due to aspiration      [name], [facility] Pager: [phone] Cell:  [phone]  Judithe Modest 05/25/2019, 5:10 AM

## 2019-05-27 NOTE — Telephone Encounter (Signed)
Death certificate has been completed and placed up front for pickup.  Also faxed certificate to [phone] per Rita's request.  [place] [facility] has been made aware.

## 2019-05-27 NOTE — Progress Notes (Signed)
Darren [name] is at bedside, she wishes to transition pt to comfort measures only and withdraw care.  Orders placed.  Pt placed on nasal cannula, and vasopressors discontinued.  Morphine drip and prn ativan as needed.  Chaplain at bedside for support.   [name], AGACNP-BC [facility] Pager: [phone] Cell: [phone]

## 2019-05-27 DEATH — deceased

## 2019-08-09 IMAGING — CT CT HEAD WITHOUT CONTRAST
4 series · 15 of 37 positions shown, 16 images · non-contrast
Comparison: MRI head 05/29/2018

CLINICAL DATA: Altered mental status

EXAM:
CT HEAD WITHOUT CONTRAST
TECHNIQUE: Contiguous axial images were obtained from the base of the skull
through the vertex without intravenous contrast.

[Series 3: head wo · axial · 0.47mm/px · z∈[-113,-23]mm · 4 of 30 slices shown, 5 images (1 of 2)]
[im 6/30  brain]
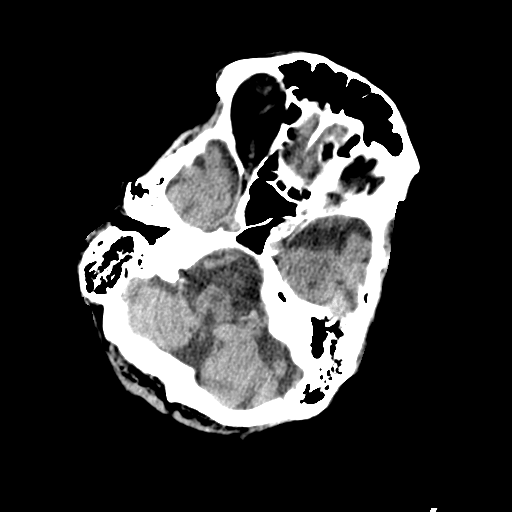
[im 6/30  bone]
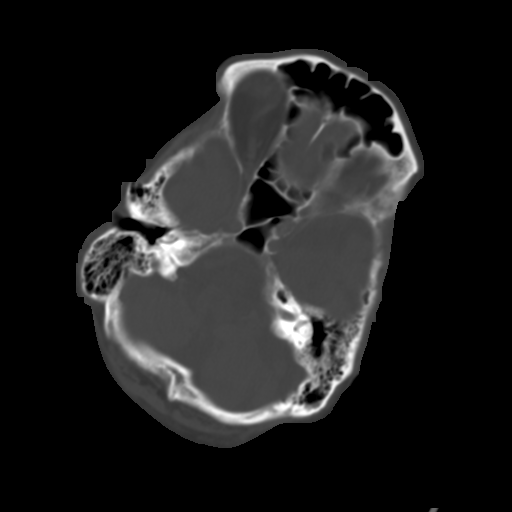
[im 12/30  brain]
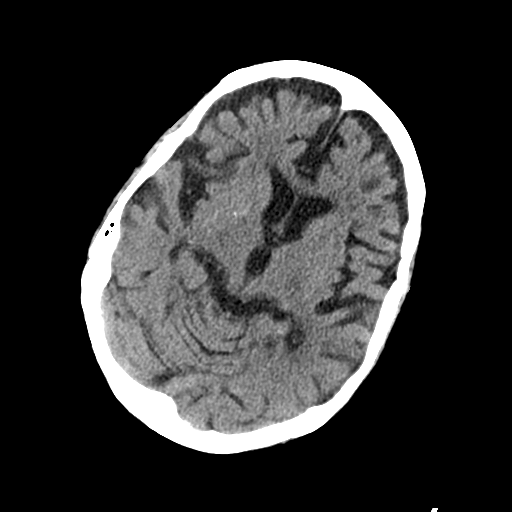
[im 18/30  brain]
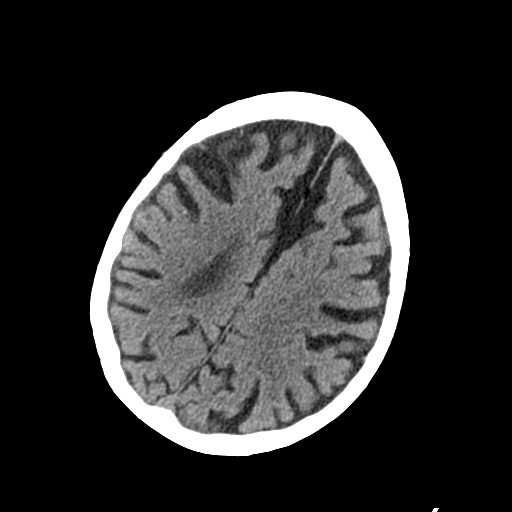
[im 24/30  brain]
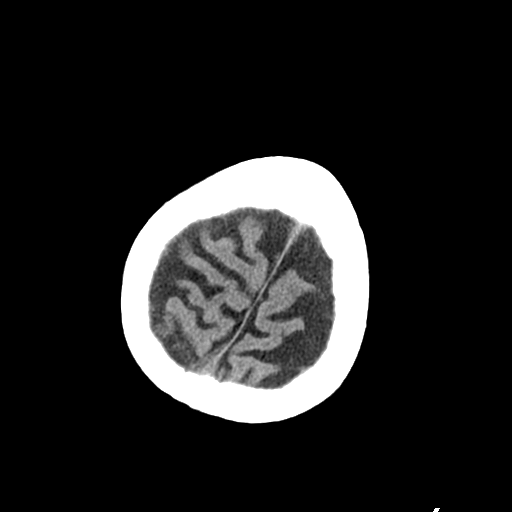

[Series 4: head wo · axial · 0.30mm/px · z∈[-99,-9]mm · 5 of 29 slices shown (2 of 2)]
[im 5/29  brain]
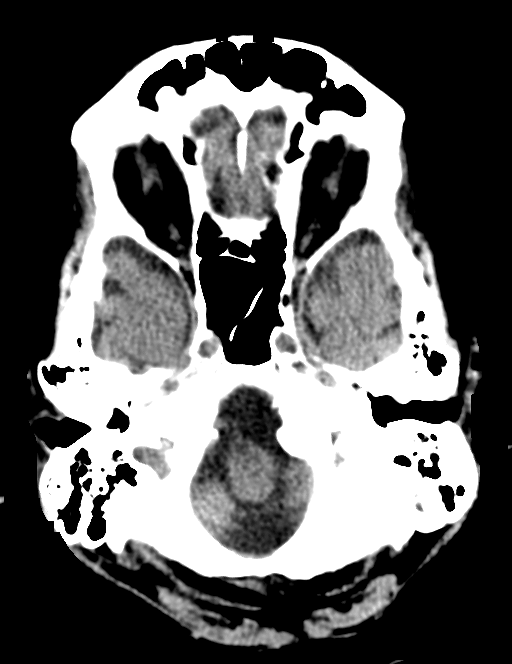
[im 10/29  brain]
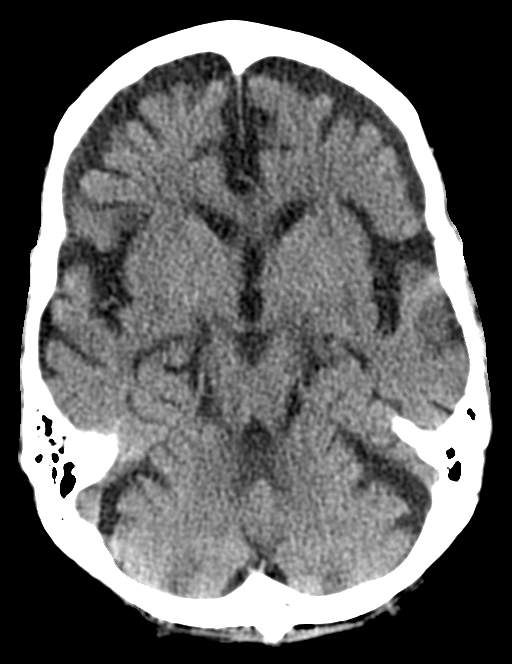
[im 15/29  brain]
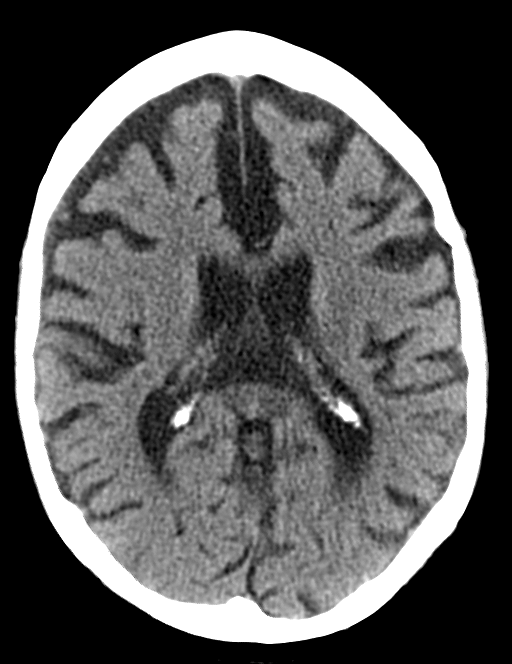
[im 19/29  brain]
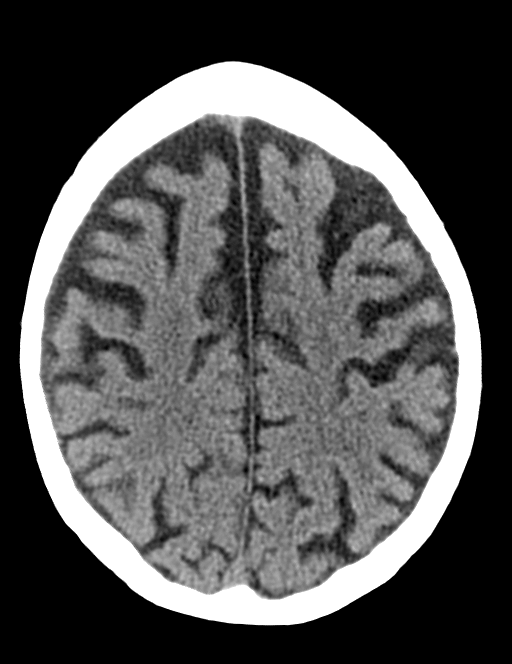
[im 24/29  brain]
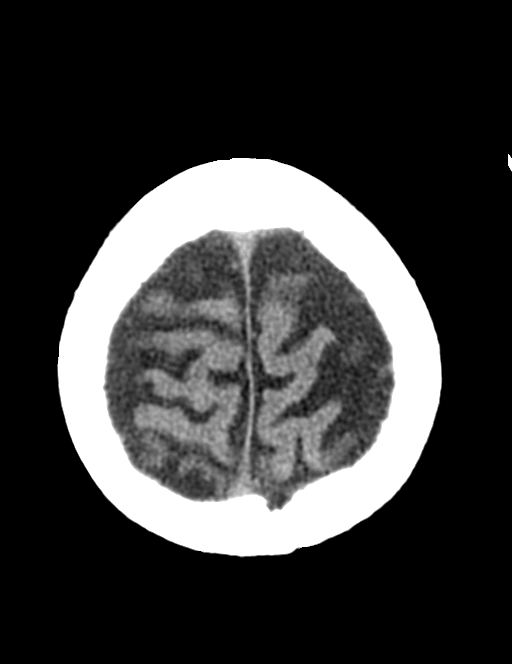

[Series 5: head bone · axial · 0.30mm/px · z∈[-110,-76]mm · 3 of 73 slices shown]
[im 5/73  bone]
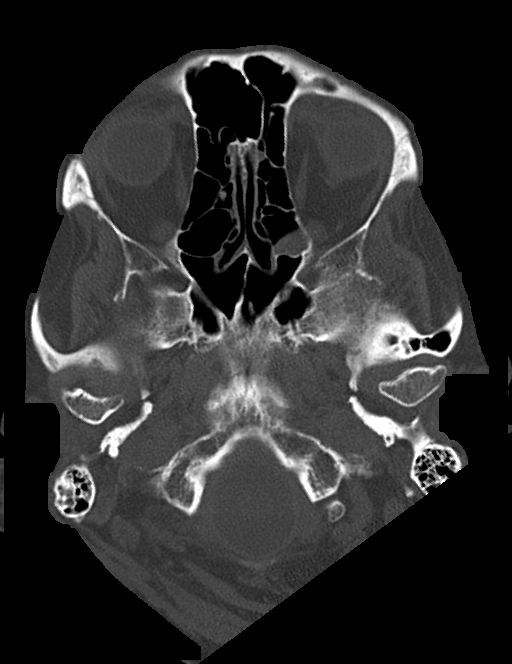
[im 14/73  bone]
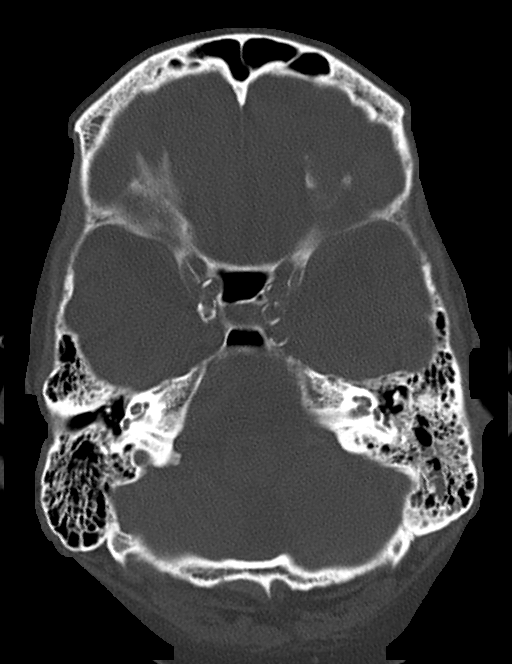
[im 23/73  bone]
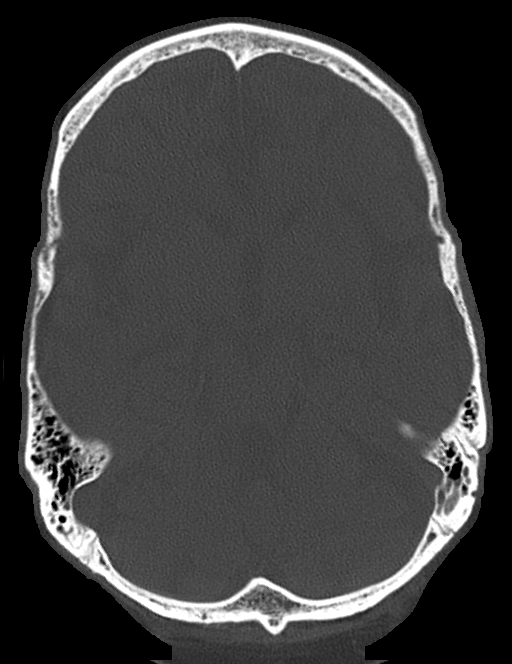

[Series 7: sagittal soft tissue · sagittal · 0.28mm/px · 3 of 52 slices shown]
[im 20/52  brain]
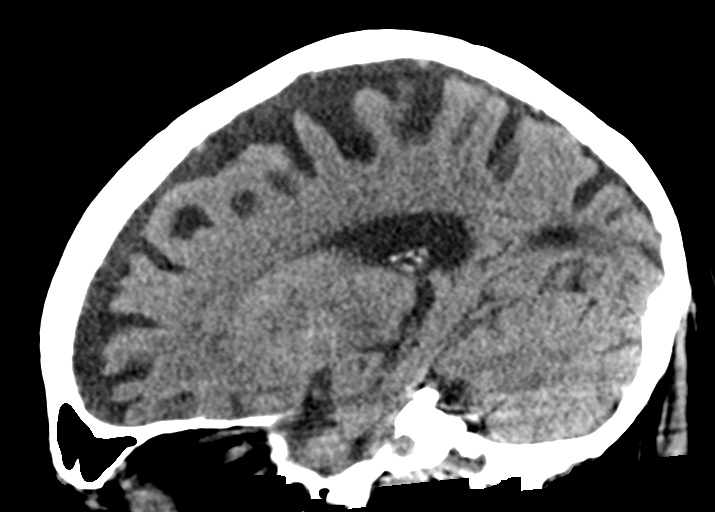
[im 26/52  brain]
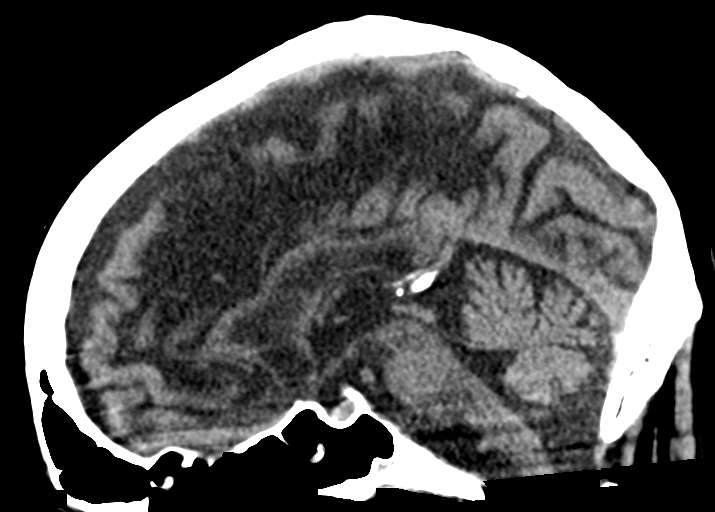
[im 32/52  brain]
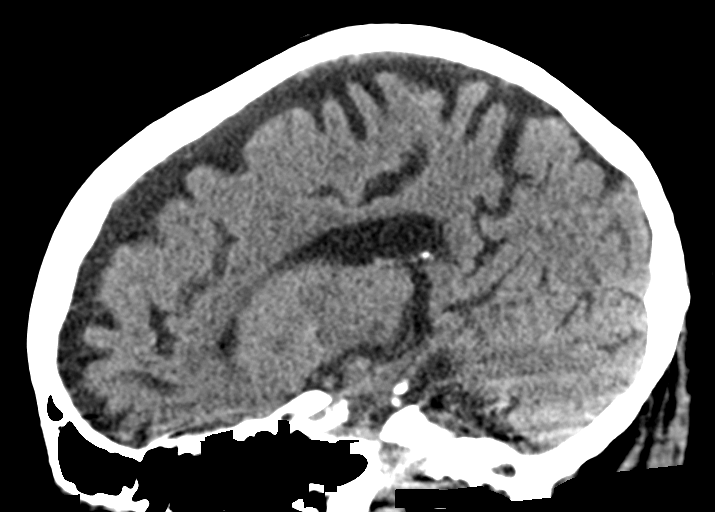

[15 of 37 positions shown; findings below may reference images not displayed]

FINDINGS: Brain: Moderate atrophy. Negative for hydrocephalus. Negative for
acute infarct, hemorrhage, or mass.

Vascular: Negative for hyperdense vessel.

Skull: Negative

Sinuses/Orbits: Mucosal edema right maxillary sinus and left ethmoid
sinus. Mild left mastoid effusion. Negative orbit

Other: None
IMPRESSION: Moderate atrophy.  No acute abnormality.

## 2019-08-13 IMAGING — DX PORTABLE CHEST - 1 VIEW
1 series · 1 of 1 positions shown · non-contrast
Comparison: 05/09/2019 and earlier.

CLINICAL DATA: 72-year-old male with respiratory failure, negative
[id number] on 04/30/2019. aspiration.

EXAM:
PORTABLE CHEST 1 VIEW

[chest ap]
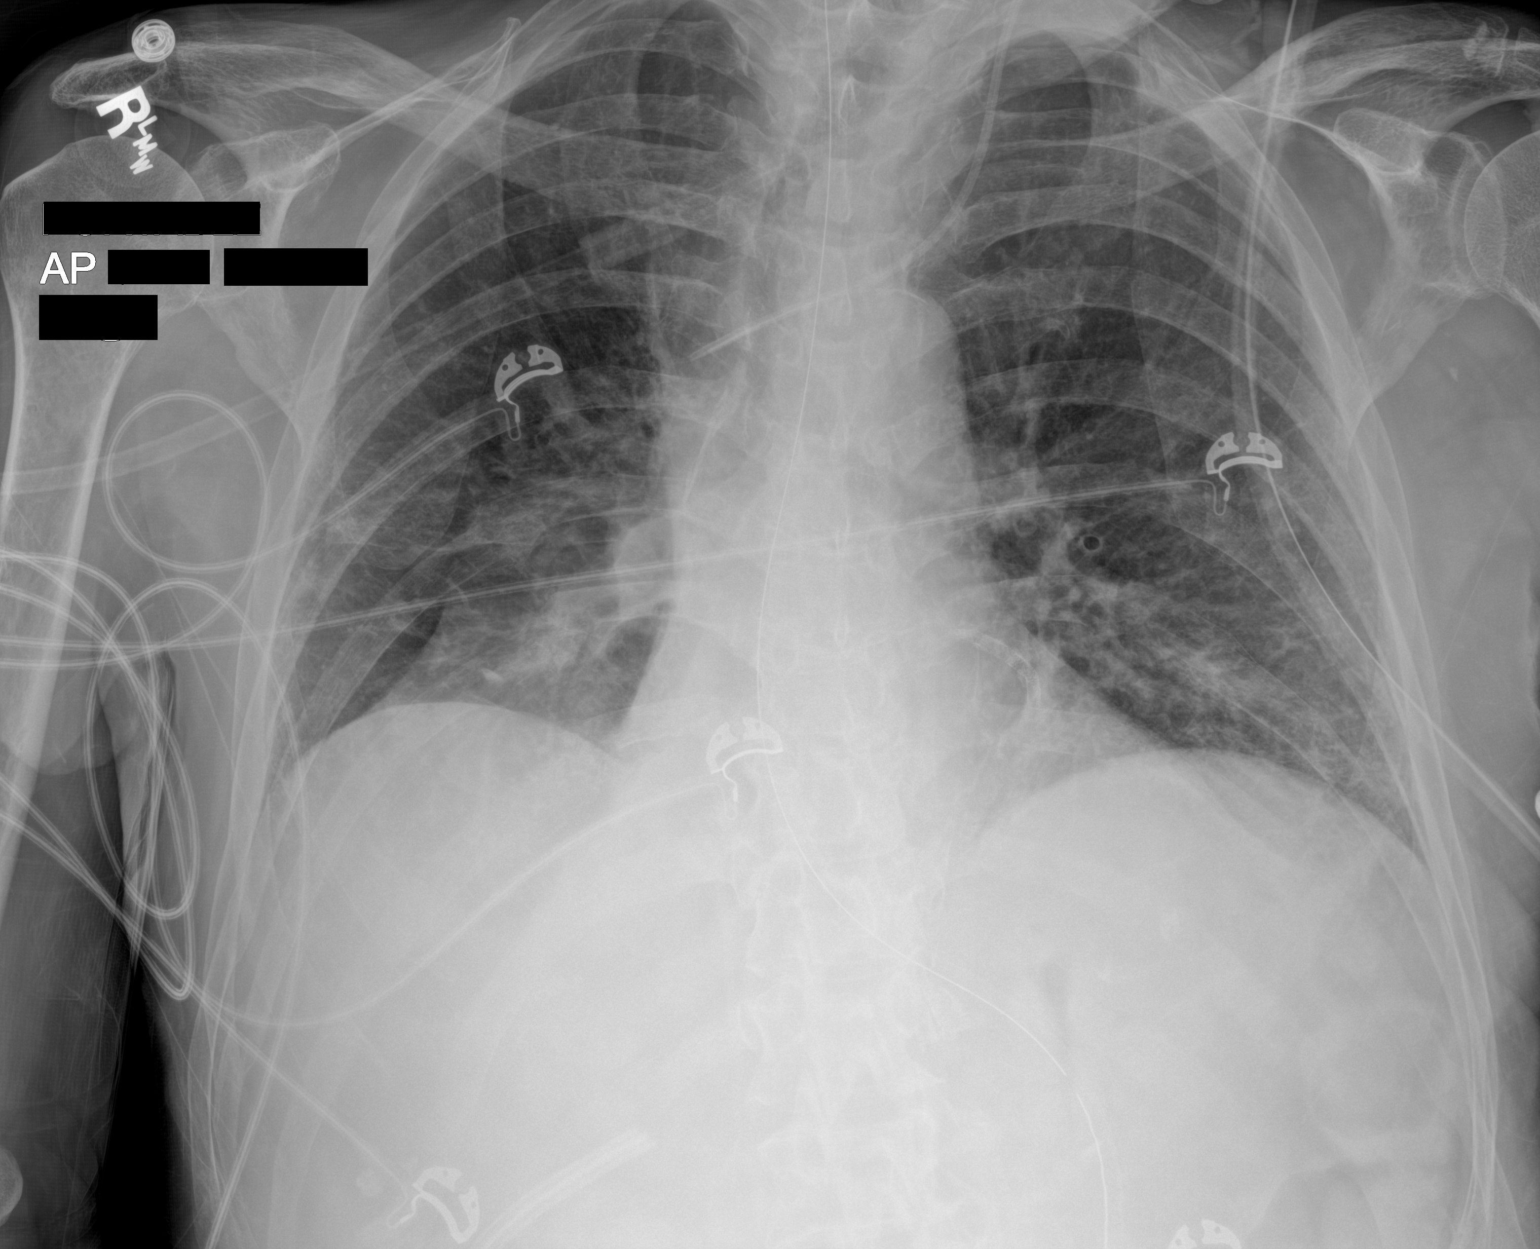

[1 of 1 positions shown; findings below may reference images not displayed]

FINDINGS: Portable AP semi upright view at 9993 hours. Extubated. Enteric tube
remains in place with side hole at the level of the gastric body.
Left IJ approach central line remains in place, tip now at the upper
SVC level.

Mildly lower lung volumes. Stable cardiac size and mediastinal
contours. Improved bilateral ventilation since 05/09/2019 with mid
and lower lung predominant streaky and interstitial opacity.

No areas of worsening ventilation. No pneumothorax or pleural
effusion identified. Paucity of bowel gas.
IMPRESSION: 1. Extubated. Enteric tube and left IJ central line remain in place.
2. Improved bilateral ventilation since [DATE]/00000000 with residual
mid and lower lung predominant streaky and interstitial opacity.
3. No new cardiopulmonary abnormality.
